# Patient Record
Sex: Female | Born: 1960 | Race: White | Hispanic: No | Marital: Married | Smoking: Former smoker
Health system: Southern US, Community
[De-identification: ages and names within clinical notes are randomized; demographics above are authoritative.]

## PROBLEM LIST (undated history)

## (undated) DIAGNOSIS — F4310 Post-traumatic stress disorder, unspecified: Secondary | ICD-10-CM

## (undated) DIAGNOSIS — N289 Disorder of kidney and ureter, unspecified: Secondary | ICD-10-CM

## (undated) DIAGNOSIS — M797 Fibromyalgia: Secondary | ICD-10-CM

## (undated) DIAGNOSIS — G894 Chronic pain syndrome: Secondary | ICD-10-CM

## (undated) DIAGNOSIS — IMO0001 Reserved for inherently not codable concepts without codable children: Secondary | ICD-10-CM

## (undated) DIAGNOSIS — F419 Anxiety disorder, unspecified: Secondary | ICD-10-CM

## (undated) DIAGNOSIS — M722 Plantar fascial fibromatosis: Secondary | ICD-10-CM

## (undated) DIAGNOSIS — F319 Bipolar disorder, unspecified: Secondary | ICD-10-CM

## (undated) DIAGNOSIS — E079 Disorder of thyroid, unspecified: Secondary | ICD-10-CM

## (undated) DIAGNOSIS — F32A Depression, unspecified: Secondary | ICD-10-CM

## (undated) DIAGNOSIS — R5383 Other fatigue: Secondary | ICD-10-CM

## (undated) DIAGNOSIS — F329 Major depressive disorder, single episode, unspecified: Secondary | ICD-10-CM

## (undated) HISTORY — DX: Major depressive disorder, single episode, unspecified: F32.9

## (undated) HISTORY — DX: Depression, unspecified: F32.A

## (undated) HISTORY — DX: Plantar fascial fibromatosis: M72.2

## (undated) HISTORY — PX: APPENDECTOMY: SHX54

## (undated) HISTORY — DX: Post-traumatic stress disorder, unspecified: F43.10

## (undated) HISTORY — PX: HERNIA REPAIR: SHX51

## (undated) HISTORY — PX: FINGER SURGERY: SHX640

## (undated) HISTORY — DX: Anxiety disorder, unspecified: F41.9

## (undated) HISTORY — PX: TONSILLECTOMY: SUR1361

## (undated) HISTORY — DX: Bipolar disorder, unspecified: F31.9

## (undated) HISTORY — PX: NASAL SINUS SURGERY: SHX719

## (undated) HISTORY — DX: Disorder of kidney and ureter, unspecified: N28.9

## (undated) HISTORY — PX: CHOLECYSTECTOMY: SHX55

## (undated) HISTORY — PX: ECTOPIC PREGNANCY SURGERY: SHX613

---

## 1998-09-03 ENCOUNTER — Ambulatory Visit (HOSPITAL_COMMUNITY): Admission: RE | Admit: 1998-09-03 | Discharge: 1998-09-03 | Payer: Self-pay | Admitting: Orthopaedic Surgery

## 1998-09-03 ENCOUNTER — Encounter: Payer: Self-pay | Admitting: Orthopaedic Surgery

## 1999-02-28 ENCOUNTER — Emergency Department (HOSPITAL_COMMUNITY): Admission: EM | Admit: 1999-02-28 | Discharge: 1999-02-28 | Payer: Self-pay | Admitting: Emergency Medicine

## 1999-02-28 ENCOUNTER — Encounter: Payer: Self-pay | Admitting: Emergency Medicine

## 1999-03-06 ENCOUNTER — Ambulatory Visit (HOSPITAL_COMMUNITY): Admission: RE | Admit: 1999-03-06 | Discharge: 1999-03-06 | Payer: Self-pay | Admitting: Gastroenterology

## 1999-03-06 ENCOUNTER — Encounter: Payer: Self-pay | Admitting: Gastroenterology

## 1999-03-11 ENCOUNTER — Ambulatory Visit (HOSPITAL_COMMUNITY): Admission: RE | Admit: 1999-03-11 | Discharge: 1999-03-11 | Payer: Self-pay | Admitting: Gastroenterology

## 1999-03-25 ENCOUNTER — Encounter: Payer: Self-pay | Admitting: Obstetrics and Gynecology

## 1999-03-25 ENCOUNTER — Ambulatory Visit (HOSPITAL_COMMUNITY): Admission: RE | Admit: 1999-03-25 | Discharge: 1999-03-25 | Payer: Self-pay | Admitting: Obstetrics and Gynecology

## 2000-02-18 ENCOUNTER — Encounter: Payer: Self-pay | Admitting: Gastroenterology

## 2000-02-18 ENCOUNTER — Encounter: Admission: RE | Admit: 2000-02-18 | Discharge: 2000-02-18 | Payer: Self-pay | Admitting: Gastroenterology

## 2000-02-20 ENCOUNTER — Encounter (INDEPENDENT_AMBULATORY_CARE_PROVIDER_SITE_OTHER): Payer: Self-pay | Admitting: Specialist

## 2000-02-20 ENCOUNTER — Ambulatory Visit (HOSPITAL_COMMUNITY): Admission: RE | Admit: 2000-02-20 | Discharge: 2000-02-20 | Payer: Self-pay | Admitting: Gastroenterology

## 2000-02-20 ENCOUNTER — Encounter: Payer: Self-pay | Admitting: Gastroenterology

## 2000-02-20 ENCOUNTER — Inpatient Hospital Stay (HOSPITAL_COMMUNITY): Admission: EM | Admit: 2000-02-20 | Discharge: 2000-02-22 | Payer: Self-pay | Admitting: Emergency Medicine

## 2000-02-21 ENCOUNTER — Encounter: Payer: Self-pay | Admitting: General Surgery

## 2000-03-25 ENCOUNTER — Ambulatory Visit (HOSPITAL_COMMUNITY): Admission: RE | Admit: 2000-03-25 | Discharge: 2000-03-25 | Payer: Self-pay | Admitting: Obstetrics and Gynecology

## 2000-03-25 ENCOUNTER — Encounter (INDEPENDENT_AMBULATORY_CARE_PROVIDER_SITE_OTHER): Payer: Self-pay | Admitting: Specialist

## 2000-03-25 ENCOUNTER — Inpatient Hospital Stay (HOSPITAL_COMMUNITY): Admission: AD | Admit: 2000-03-25 | Discharge: 2000-03-25 | Payer: Self-pay | Admitting: Obstetrics and Gynecology

## 2000-07-02 ENCOUNTER — Encounter: Payer: Self-pay | Admitting: Gastroenterology

## 2000-07-02 ENCOUNTER — Ambulatory Visit (HOSPITAL_COMMUNITY): Admission: RE | Admit: 2000-07-02 | Discharge: 2000-07-02 | Payer: Self-pay | Admitting: Gastroenterology

## 2000-07-20 ENCOUNTER — Ambulatory Visit (HOSPITAL_BASED_OUTPATIENT_CLINIC_OR_DEPARTMENT_OTHER): Admission: RE | Admit: 2000-07-20 | Discharge: 2000-07-20 | Payer: Self-pay | Admitting: General Surgery

## 2000-10-06 DIAGNOSIS — IMO0001 Reserved for inherently not codable concepts without codable children: Secondary | ICD-10-CM

## 2000-10-06 HISTORY — DX: Reserved for inherently not codable concepts without codable children: IMO0001

## 2001-09-13 ENCOUNTER — Encounter: Payer: Self-pay | Admitting: Emergency Medicine

## 2001-09-13 ENCOUNTER — Inpatient Hospital Stay (HOSPITAL_COMMUNITY): Admission: EM | Admit: 2001-09-13 | Discharge: 2001-09-16 | Payer: Self-pay | Admitting: Emergency Medicine

## 2001-09-14 ENCOUNTER — Encounter: Payer: Self-pay | Admitting: Cardiology

## 2001-09-16 ENCOUNTER — Encounter: Payer: Self-pay | Admitting: Cardiology

## 2001-09-22 ENCOUNTER — Encounter: Admission: RE | Admit: 2001-09-22 | Discharge: 2001-09-22 | Payer: Self-pay | Admitting: Cardiology

## 2001-09-22 ENCOUNTER — Encounter: Payer: Self-pay | Admitting: Cardiology

## 2001-11-30 ENCOUNTER — Encounter: Payer: Self-pay | Admitting: Obstetrics and Gynecology

## 2001-11-30 ENCOUNTER — Ambulatory Visit (HOSPITAL_COMMUNITY): Admission: RE | Admit: 2001-11-30 | Discharge: 2001-11-30 | Payer: Self-pay | Admitting: Obstetrics and Gynecology

## 2002-02-25 ENCOUNTER — Encounter: Payer: Self-pay | Admitting: *Deleted

## 2002-02-25 ENCOUNTER — Emergency Department (HOSPITAL_COMMUNITY): Admission: EM | Admit: 2002-02-25 | Discharge: 2002-02-25 | Payer: Self-pay | Admitting: *Deleted

## 2002-03-03 ENCOUNTER — Encounter: Payer: Self-pay | Admitting: General Surgery

## 2002-03-03 ENCOUNTER — Observation Stay (HOSPITAL_COMMUNITY): Admission: RE | Admit: 2002-03-03 | Discharge: 2002-03-04 | Payer: Self-pay | Admitting: General Surgery

## 2002-11-23 ENCOUNTER — Encounter: Payer: Self-pay | Admitting: Otolaryngology

## 2002-11-23 ENCOUNTER — Encounter: Admission: RE | Admit: 2002-11-23 | Discharge: 2002-11-23 | Payer: Self-pay | Admitting: Otolaryngology

## 2003-10-17 ENCOUNTER — Emergency Department (HOSPITAL_COMMUNITY): Admission: EM | Admit: 2003-10-17 | Discharge: 2003-10-17 | Payer: Self-pay | Admitting: *Deleted

## 2003-10-31 ENCOUNTER — Other Ambulatory Visit: Admission: RE | Admit: 2003-10-31 | Discharge: 2003-10-31 | Payer: Self-pay | Admitting: Obstetrics and Gynecology

## 2003-11-08 ENCOUNTER — Encounter: Admission: RE | Admit: 2003-11-08 | Discharge: 2003-11-08 | Payer: Self-pay | Admitting: Internal Medicine

## 2004-01-26 ENCOUNTER — Ambulatory Visit (HOSPITAL_COMMUNITY): Admission: RE | Admit: 2004-01-26 | Discharge: 2004-01-26 | Payer: Self-pay | Admitting: Internal Medicine

## 2004-05-29 ENCOUNTER — Ambulatory Visit (HOSPITAL_COMMUNITY): Admission: RE | Admit: 2004-05-29 | Discharge: 2004-05-29 | Payer: Self-pay | Admitting: Internal Medicine

## 2004-09-18 ENCOUNTER — Emergency Department (HOSPITAL_COMMUNITY): Admission: EM | Admit: 2004-09-18 | Discharge: 2004-09-18 | Payer: Self-pay | Admitting: Emergency Medicine

## 2005-01-16 ENCOUNTER — Inpatient Hospital Stay (HOSPITAL_COMMUNITY): Admission: RE | Admit: 2005-01-16 | Discharge: 2005-01-21 | Payer: Self-pay | Admitting: Psychiatry

## 2005-01-16 ENCOUNTER — Ambulatory Visit: Payer: Self-pay | Admitting: Psychiatry

## 2005-01-17 ENCOUNTER — Ambulatory Visit (HOSPITAL_COMMUNITY): Admission: RE | Admit: 2005-01-17 | Discharge: 2005-01-17 | Payer: Self-pay | Admitting: Psychiatry

## 2006-01-25 ENCOUNTER — Emergency Department (HOSPITAL_COMMUNITY): Admission: EM | Admit: 2006-01-25 | Discharge: 2006-01-26 | Payer: Self-pay | Admitting: Emergency Medicine

## 2006-04-23 ENCOUNTER — Emergency Department (HOSPITAL_COMMUNITY): Admission: EM | Admit: 2006-04-23 | Discharge: 2006-04-23 | Payer: Self-pay | Admitting: Family Medicine

## 2007-06-20 ENCOUNTER — Emergency Department (HOSPITAL_COMMUNITY): Admission: EM | Admit: 2007-06-20 | Discharge: 2007-06-21 | Payer: Self-pay | Admitting: Emergency Medicine

## 2007-06-29 ENCOUNTER — Encounter: Admission: RE | Admit: 2007-06-29 | Discharge: 2007-06-29 | Payer: Self-pay | Admitting: Gastroenterology

## 2007-11-26 ENCOUNTER — Emergency Department (HOSPITAL_COMMUNITY): Admission: EM | Admit: 2007-11-26 | Discharge: 2007-11-26 | Payer: Self-pay | Admitting: Emergency Medicine

## 2008-01-02 ENCOUNTER — Emergency Department (HOSPITAL_COMMUNITY): Admission: EM | Admit: 2008-01-02 | Discharge: 2008-01-02 | Payer: Self-pay | Admitting: Emergency Medicine

## 2008-01-03 ENCOUNTER — Inpatient Hospital Stay (HOSPITAL_COMMUNITY): Admission: AD | Admit: 2008-01-03 | Discharge: 2008-01-05 | Payer: Self-pay | Admitting: *Deleted

## 2008-01-03 ENCOUNTER — Ambulatory Visit: Payer: Self-pay | Admitting: Psychiatry

## 2008-03-14 ENCOUNTER — Emergency Department (HOSPITAL_COMMUNITY): Admission: EM | Admit: 2008-03-14 | Discharge: 2008-03-14 | Payer: Self-pay | Admitting: Emergency Medicine

## 2008-03-21 ENCOUNTER — Encounter: Admission: RE | Admit: 2008-03-21 | Discharge: 2008-03-21 | Payer: Self-pay | Admitting: Emergency Medicine

## 2008-07-07 ENCOUNTER — Emergency Department (HOSPITAL_COMMUNITY): Admission: EM | Admit: 2008-07-07 | Discharge: 2008-07-07 | Payer: Self-pay | Admitting: Family Medicine

## 2010-11-13 ENCOUNTER — Inpatient Hospital Stay (INDEPENDENT_AMBULATORY_CARE_PROVIDER_SITE_OTHER)
Admission: RE | Admit: 2010-11-13 | Discharge: 2010-11-13 | Disposition: A | Discharge status: Home / Self Care | Payer: Self-pay | Source: Ambulatory Visit | Attending: Family Medicine | Admitting: Family Medicine

## 2010-11-13 DIAGNOSIS — J40 Bronchitis, not specified as acute or chronic: Secondary | ICD-10-CM
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
## 2011-02-21 NOTE — Discharge Summary (Signed)
[place]. [facility]  Patient:    Daisy [name], STEMMER Visit Number: [id number] MRN: [id number]          Service Type: MED Location: [phone] Attending Physician:  Learta Codding Dictated by:   Guy Franco, P.A. [facility] Admit Date:  09/13/2001 Discharge Date: 09/16/2001   CC:         [facility], [address] [place], [place]   Referring Physician Discharge [place]  DISCHARGE DIAGNOSES: 1. Chest pain, resolved. 2. Tobacco abuse. 3. Hypothyroidism, treated.  HISTORY OF PRESENT ILLNESS:  The patient is a 50 year old white female with no known history of coronary artery disease, who began having substernal chest pain on the evening prior to her admission.  The pain radiated to her left arm and was described as a "severe ache."  HOSPITAL COURSE:  The patient was admitted to [facility] and had the following procedures performed:  EKG revealed normal sinus rhythm, rate 81, and no ST-T wave changes.  D-dimer 0.25.  White blood cell count 9.0, hemoglobin 13.2, hematocrit 38.5, platelets 270.  Sodium 139, potassium 3.7, BUN 11, creatinine 0.8.  Cardiac isoenzymes and troponins were performed serially and these were negative.  A lipid profile revealed a total cholesterol of 154, triglycerides 95, HDL 41, and LDL 94.  TSH 2.150.  A beta hCG was negative.  C-reactive protein 1.1.  The urinalysis revealed bacteria.  In addition to her cardiac work-up, she did undergo a stress Cardiolite which revealed no inducible ischemia or infarction and EF 76%.  Because she continued to have substernal chest pain, she then underwent a cardiac catheterization which revealed normal systolic function and EF 65% with normal coronary arteries.  The patient did, however, have a myocardial bridge of the LAD.  In addition, she underwent a CT of the chest to evaluate for possible PE, however, this was negative.  DISPOSITION:  She was stable and felt to be ready to go  home on September 16, 2001, on the following medications.  DISCHARGE MEDICATIONS: 1. Synthroid 100 mcg one p.o. q.d. 2. Enteric-coated aspirin 325 mg a day. 3. Cardizem CD 120 mg a day.  DIET:  She is to remain on a low-fat diet.  WOUND CARE:  Clean over her catheterization site with soap and water.  FOLLOW-UP:  Follow up with her [facility] physician as needed.  She has a follow-up appointment with Lewayne Bunting, M.D., on September 30, 2001, at 10:15 a.m. Dictated by:   Guy Franco, P.A. [facility] Attending Physician:  Learta Codding DD:  09/16/01 TD:  09/16/01 Job: [id number]

## 2011-02-21 NOTE — H&P (Signed)
[name], [name]               ACCOUNT NO.:  [id number]   MEDICAL RECORD NO.:  [id number]          PATIENT TYPE:  IPS   LOCATION:  0307                          FACILITY:  [facility]   PHYSICIAN:  Daisy [name], M.D.      DATE OF BIRTH:  11/28/1960   DATE OF ADMISSION:  01/16/2005  DATE OF DISCHARGE:                         PSYCHIATRIC ADMISSION ASSESSMENT   IDENTIFYING INFORMATION:  A 50 year old separated female, voluntarily  admitted on January 16, 2005.   HISTORY OF PRESENT ILLNESS:  The patient presents with a history of  depression, positive auditory hallucinations, also visual hallucinations,  seeing shadows.  The patient has been experiencing hallucinations for the  last 2 days.  The patient reports she hears her daughter and has seen  shadows, also experiencing suicide ideation, wanting to overdose.  The  patient also reports physical problems, having recent muscle aches.  She  reports decreased sleep, decreased appetite with a 17 pound weight loss,  also has had some recent medication changes.  She reports no significant  stressors.   PAST PSYCHIATRIC HISTORY:  First admission to [facility], has  a history of bipolar disorder, has a psychologist Arts administrator at [facility], also has a Publishing rights manager that she sees at [facility], first name  Daisy [name].   SOCIAL HISTORY:  She is a 50 year old separated white female, second  married, has two children ages 26 and 67 years of age.  Lives with her 97-  year-old.  The patient is currently unemployed, states she is on medical  leave, was an Physiological scientist.  She reports no legal problems.   FAMILY HISTORY:  Denies.   ALCOHOL DRUG HISTORY:  The patient smokes, denies any alcohol or drug use.   PAST MEDICAL HISTORY:  Primary care Daisy [name] is Dr. Wylene [name] at [facility], phone number [phone].  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   MEDICATIONS:  Has been on Dostinex that  is to help with shrinkage of the  tumor and has been taking Synthroid 88 mcg, reports she has been compliant.  Also has been on Cymbalta 90 mg, Geodon 60 mg b.i.d. since Friday prior to  this admission, Depakote 1000 mg, stopped her medication because she reports  double vision, and Klonopin 1 mg t.i.d..   DRUG ALLERGIES:  No known allergies.   PHYSICAL EXAMINATION:  Done.  Review of systems is significant for decreased  sleep.  Reports no chest pain or shortness of breath.  The patient smokes.  She reports a decreased appetite with a 17 pound weight loss.  Medical  history:  History of pituitary tumor and hypothyroidism.  Temperature is  99.4, 106 heart rate, 20 respirations.  Blood pressure is 125/79.  The  patient is approximately 5 feet 6 inches tall, overweight female in no acute  distress.  Negative lymphadenopathy.  CHEST:  Clear.  BREAST EXAM:  Deferred.  HEART:  Regular rate and rhythm.  ABDOMEN:  Soft, nontender abdomen, mildly obese.  EXTREMITIES:  Moves all extremities, 5+ against resistance.  SKIN:  Warm dry, no rashes or lacerations are  noted.  NEUROLOGICAL FINDINGS:  Intact, nonfocal, able to perform heel-to-shin and  normal alternating movements easily.   LABORATORY DATA:  Her CBC is within normal limits.  Her potassium is 3.3,  glucose is 157.  TSH is elevated at 22.145.  Pregnancy test is negative.   MENTAL STATUS EXAM:  Alert, cooperative female, good eye contact, casually  dressed.  Speech is clear, normal rate and tone.  The patient feels sad.  The patient is teary-eyed at times.  Thought processes:  The patient  endorsing positive auditory and visual hallucinations, does not appear to be  actively responding.  Answers are goal directed.  Cognitive function intact.  Memory is good.  Judgment and insight are fair.   ADMISSION DIAGNOSES:   AXIS I:  Bipolar disorder with psychotic features.   AXIS II:  Deferred.   AXIS III:  Hypothyroidism, pituitary  tumor.   AXIS IV:  Problems with primary support group, medical problems, economic  problems.   AXIS V:  Current is 35, this past year 71.   PLAN:  Stabilize mood and thinking.  We will get a CT scan as the patient is  complaining of headache and onset of hallucinations.  We will hold her  Depakote now as the patient reports complaints of blurred vision.  We will  follow up with primary care physician with the patient's elevated thyroid  level.  The patient is to follow up with her appointments at [facility].   TENTATIVE LENGTH OF CARE:  4-5 days.      JO/MEDQ  D:  01/20/2005  T:  01/20/2005  Job:  [id number]

## 2011-02-21 NOTE — H&P (Signed)
[place]. [facility]  Patient:    DENEISHA, DADE                     MRN: [id number] Adm. Date:  02/20/00 Attending:  Anselmo Rod, M.D. CC:         Duncan Dull, M.D.             Adolph Pollack, M.D.                         History and Physical  DATE OF BIRTH:  [birth year].  CHIEF COMPLAINT:  Acute right upper quadrant abdominal pain with nausea and subscapular back pain.  HISTORY OF PRESENT ILLNESS:  Ms. Mitchell Epling is a 50 year old white female who has been seeing me for over a year for right upper quadrant pain.  Patient had an ultrasound and HIDA scan done about a year ago that was read as normal. The patient presented to my office earlier this week with severe right upper quadrant pain, nausea and vomiting, with radiation of the pain to her back. She says she has been uncomfortable for several days and unable to eat or sleep at night.  She denies GI bleeding in the form of BRBPR or melena.  There is no history of fever, chills or rigors.  There is no previous history of ulcers, jaundice or colitis.  Patient had an ultrasound yesterday that was somewhat abnormal, in the fact that there was shadowing seen around the neck of the gallbladder, but no definite stones were identified.  The shadowing persisted in multiple positions and a HIDA scan was done today that showed an ejection fraction of 13.9%.  Considering the patients acute abdominal pain with no relief on the pain medication she is on, patient was asked to come to the emergency room for possible admission.  ALLERGIES:  TYLOX.  MEDICATIONS:  Paxil 20 mg per day.  PAST MEDICAL HISTORY:  History of depression, on Paxil for several years.  PAST SURGICAL HISTORY: 1. Appendectomy in 1979. 2. C-sections in 1984 and 1989.  SOCIAL HISTORY:  The patient is married.  She has two children, a 51 year old son and 60 year old daughter.  She smokes a half pack per day, for the last  23 years.  She denies use of alcohol or street drugs.  She works at a Audiological [employer].  FAMILY HISTORY:  Her father is 84 and has hypertension and heart disease. Mother is 23 and has hypertension and heart disease and diabetes.  The patient has four brothers who are healthy.  There is a history of colon cancer in a maternal uncle and breast cancer in a maternal aunt but the age of diagnosis is not known.  There is no family history of prostate, ovarian or uterine cancer.  REVIEW OF SYSTEMS: 1. Headaches. 2. Nausea and vomiting. 3. RUQ pain with subscapular right sided back pain. 4. Patient says she has had occasional BRBPR secondary to hard stool but not    in the recent past.  PHYSICAL EXAMINATION:  GENERAL:  Examination reveals a middle-aged white female in distress secondary to pain.  VITAL SIGNS:  Temperature is 99.2, blood pressure 120/80, pulse 87 per minute, respiratory rate 18.  HEENT:  Atraumatic and normocephalic head.  PERRLA.  Extraocular muscles intact.  Facial symmetry preserved.  NECK:  Supple.  CHEST:  Clear to auscultation.  S1 and S2 regular.  No murmur, rub or gallop.  No rales, rhonchi or wheezing.  ABDOMEN:  Soft with significant right upper quadrant tenderness and positive Murphys sign.  No hepatosplenomegaly.  No masses palpable.  There are C-section and appendectomy scars present.  EXTREMITIES:  Without edema, cyanosis or clubbing.  NEUROLOGIC:  Patient is neurologically intact.  LABORATORY DATA:  Laboratory evaluation revealed a white count of 16,900, hemoglobin 13.8, hematocrit 40%, platelets 283,000, MCV 85.3, neutrophils 74%, ANC 12.6%.  Sodium 135, potassium 3.3, chloride 104, CO2 26, BUN 12, creatinine 0.7, glucose 128, calcium 8.4, total protein 6.4, albumin 3.2, AST normal, ALT 74, alkaline phosphatase 56, total bilirubin 0.3, amylase 51, lipase 21.  ASSESSMENT AND PLAN:  Acute cholecystits with gallbladder ejection fraction of 13.9%.   Plan is to keep the patient on Demerol and Phenergan for symptomatic relief, start intravenous fluids and procure a surgical consultation.  I have discussed the situation with Dr. Adolph Pollack, who will see her at his earliest convenience.  Patient will be started on antibiotics, considering her white count of 16,900 in the emergency room today.  Further recommendations will be made as we continue to follow her with the surgical service. DD:  02/20/00 TD:  02/21/00 Job: [id number]

## 2011-02-21 NOTE — Op Note (Signed)
[facility]  Patient:    Daisy [name], Daisy [name]                      MRN: [id number] Proc. Date: 03/25/00 Adm. Date:  [id number] Disc. Date: [id number] Attending:  Lendon Colonel                           Operative Report  PREOPERATIVE DIAGNOSIS:  Ectopic.  POSTOPERATIVE DIAGNOSIS:  Ectopic.  OPERATION PERFORMED:  Operative laparoscopy right partial salpingectomy with removal of ectopic pregnancy.  DESCRIPTION OF PROCEDURE:  The patient was placed in lithotomy position, prepped and draped in the usual fashion A Hulka elevator was inserted into an anterior pointing uterus. Visualization of the pelvis was accomplished by making a transverse umbilical incision. The abdomen was distended with carbon dioxide and with a Veress needle, aspiration infusion technique was utilized. The bladder had been previously emptied. A trocar was inserted into the abdomen and visualization of the pelvis revealed blood in the abdominal cavity. The distal end of the right tube was involved.  I was able to obtain the end of the tube and elevated it and using the Seitzinger tripolar forceps separated the tube from the ovary and removed the ectopic on the right. There was some oozing from around the ovary and I was able to arrest this using the bipolar cautery. Hemostasis was secured and the pelvis was washed with copious amounts of saline. The right tube and ovary were normal. Sherry tolerated the procedure well. Gas was evacuated, the skin was closed with #0 Vicryl using a UR-6 needle for the umbilicus and 3-0 Vicryl for the skin incisions. Three incisions were used, one in the umbilicus, one in the midline and one in the right mid quadrant. The patient tolerated the procedure well and was sent to the recovery room in good condition. The incisions were infiltrated with 0.5% Marcaine with epinephrine. DD:  03/25/00 TD:  03/27/00 Job: [id number]

## 2011-02-21 NOTE — H&P (Signed)
[place]. [facility]  Patient:    Daisy [name], Daisy [name] Visit Number: [id number] MRN: [id number]          Service Type: MED Location: [id number] Attending Physician:  Learta Codding Dictated by:   Lewayne Bunting, M.D. [facility] Admit Date:  09/13/2001   CC:         [facility]  Lewayne Bunting, M.D. [facility]   History and Physical  REFERRING PHYSICIAN:  [facility].  CARDIOLOGIST:  New.  CHIEF COMPLAINT:  Substernal chest pain.  HISTORY OF PRESENT ILLNESS:  Ms. Macnaughton is a 50 year old white female with no prior history of coronary artery disease.  The patient reports onset of substernal chest pain starting last evening with pain radiating into the left arm associated with severe ache.  She also reports heavy retrosternal pain which lasted approximately one hour.  She felt fine later on but then developed again substernal chest pain with arm ache this morning.  This was also associated with shortness of breath and diaphoresis.  The patient has good exercise tolerance up until yesterday with no substernal chest pain or shortness of breath on exertion.  Today, at [facility], she was given three nitroglycerin with some relief.  Symptoms were associated with shortness of breath and diaphoresis.  ALLERGIES:  No known drug allergies.  MEDICATIONS:  Synthroid 100 mcg p.o. q.d.  PAST MEDICAL HISTORY:  Family history of coronary artery disease.  Tobacco use.  History of tubal ligation and cholecystectomy.  Tubal pregnancy.  Sinus surgery.  History of C-section x 2.  History of hypothyroidism.  History of toxemia pregnancy.  SOCIAL HISTORY:  [place]  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  She smokes tobacco with a 30-pack-year history. She does not drink alcohol.  She denies drug use.  FAMILY HISTORY:  Mother is alive but had a CVA in her 31s.  Father had his myocardial infarction in his 64s.  She has no siblings with coronary  artery disease.  REVIEW OF SYSTEMS:  Positive for cold sweats and weight changes associated with hypothyroidism.  No headache or sore throat.  No rash or skin lesions. CARDIOPULMONARY:  Positive for chest pain and shortness of breath.  No dyspnea on exertion.  No orthopnea, PND.  Edema in both hands.  No frequency or dysuria.  Last menstrual period was three to four months ago. NEUROPSYCHIATRIC:  Complaining of numbness in the left arm.  MUSCULOSKELETAL: No myalgias or arthalgias.  GI:  Positive for nausea, abdominal pain right-sided.  No diarrhea, bright red blood per rectum, melena, or hematochezia.  No polyuria or polydipsia.  All other review of systems are negative.  PHYSICAL EXAMINATION:  VITAL SIGNS:  Blood pressure 127/74, heart rate is 85 beats per minute, respirations are 18 per minute, temperature is 97.2.  Saturation 98% on two liters.  GENERAL:  White female in no apparent distress.  HEENT:  NCAT.  PERRLA.  EOMI.  Sclerae clear.  NECK:  Supple.  No bruit.  No JVD.  No lymphadenopathy.  HEART:  Regular rate and rhythm.  Normal S1 and S2.  No murmurs, rubs, or gallops.  Pulses are equal bilaterally.  LUNGS:  Clear breath sounds bilaterally.  SKIN:  No rashes or lesions.  ABDOMEN:  Soft and nontender.  No rebound or guarding.  GENITOURINARY:  Deferred.  RECTAL:  Deferred.  EXTREMITIES:  No clubbing, cyanosis, or edema.  MUSCULOSKELETAL:  No joint deformity.  NEUROLOGICAL:  The patient is alert, oriented, and grossly nonfocal.  Cranial nerves II-XII are grossly intact.  LABORATORY DATA:  Chest x-ray shows no acute changes.  EKG normal sinus rhythm, heart rate is 81 beats per minute and normal axis.  No ST or T-wave changes, otherwise normal electrocardiogram.  Labs are currently pending including CBC and BMET.  IMPRESSION: 1. Substernal chest pain:  The patient has multiple risk factors for coronary    artery disease with a very strong family history of  premature coronary    artery disease.  Although, she has some typical features to her chest pain    syndrome, some other features are rather atypical.  She also premenopausal.    The patient will be admitted and started on aspirin, heparin, and    multi-marker assessment will be performed including a high sensitive    CRP, troponin level, and BNP level.  If the patient does have positive    markers, I would be leaning towards proceeding with a cardiac    catheterization.  I have discussed this with the patient and she has    agreed to a cardiac catheterization in the a.m. and she understands the    risks and benefits.  If her myocardial injury markers are within normal    limits, I would lean more towards an exercise Cardiolite study which    can be done in the morning. 2. Last menstrual period three to four months ago:  Check beta hCG tonight.Dictated by:   Lewayne Bunting, M.D. LHC Attending Physician:  Learta Codding DD:  09/13/01 TD:  09/14/01 Job: [id number]
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
## 2011-02-21 NOTE — Op Note (Signed)
[facility]  Patient:    Daisy [name], Daisy [name] Visit Number: [id number] MRN: [id number]          Service Type: SUR Location: 3W 0346 01 Attending Physician:  Arlis Porta Dictated by:   Adolph Pollack, M.D. Proc. Date: 03/03/02 Admit Date:  03/03/2002 Discharge Date: 03/04/2002                             Operative Report  PREOPERATIVE DIAGNOSIS:  Recurrent ventral incisional hernia.  POSTOPERATIVE DIAGNOSIS:  Recurrent ventral incisional hernia.  PROCEDURE:  Laparoscopic repair of recurrent ventral incisional hernia with Gore-Tex dual mesh.  SURGEON:  Adolph Pollack, M.D.  ANESTHESIA:  General.  INDICATIONS FOR PROCEDURE:  Ms. Shidler is a 50 year old female who developed an epigastric incisional hernia following a laparoscopic cholecystectomy which I repaired a little under two years ago with mesh. She began having pains and bulging type symptoms and on examination has recurrence of her hernia. She now presents for elective repair. The procedure and the risks were explained to her preoperatively.  TECHNIQUE:  She is brought to the operating room, placed supine on the operating table and a general anesthetic was administered. The abdomen was sterilely prepped and draped. A previous small subumbilical scar was reincised transversely, subcutaneous tissue was divided sharply until the fascia was identified and an approximately 1 cm incision made in the fascia. The peritoneal cavity was then entered sharply under direct vision and a pursestring suture of #0 Vicryl was placed around the fascial edges. A Hasson trocar was introduced into the peritoneal cavity and a pneumoperitoneum was created by insufflation of CO2 gas. Next a 30 degree laparoscope was introduced.  I noted a visible fascial defect in the epigastric region and it appeared to have some preperitoneal fat in it. No intestinal contents were noted in the defect.  Under  direct vision, a 5 mm trocar was placed in the left mid abdomen and one in the right mid abdomen. I then began by removing the extraperitoneal fatty contents from the hernia defect. In order to have adequate coverage of the hernia defect, I went ahead and incised the falciform ligament all the way up to the level of the diaphragm. I continued to remove more preperitoneal fat and placed it in the area close to the liver where I could eventually retrieve it. I also noted that while doing the hernia repair, I saw one other small fascial defect superior to the epigastric defect. Once I had all the hernia contents reduced, I placed some of the loose fatty tissue into an endopouch bag and removed it from the field and replaced the Hasson trocar.  I then took spinal needles and placed them at the periphery of the defects and measured approximately 3-4 cm further out from these and drew an oval. A piece of Gore-Tex dual mesh was put onto the field and cut in order to adequately cover the defect with approximately 4 cm of overlay. In the four quadrants of the mesh, anchoring sutures of #0 Novofil were placed. Four stab incisions were made within the four quadrants. The mesh was then placed into the abdominal cavity. Using an endoclose device, the sutures were brought up in each of the four quadrants across the fascial bridge and tied down anchoring the mesh to the anterior abdominal wall and more than adequately covering a defect. A spiral tack was then used to place tacks in a circular  fashion in the periphery. An inner and outer circle were placed. After adequate fixation, the mesh had been assured. I inspected the area for bleeding and noted none. I then released the pneumoperitoneum and watched the mesh approximate the omentum and liver. I then removed all trocars and released the pneumoperitoneum.  The subumbilical fascial defect was closed by tightening up and tying down the pursestring  suture. All skin incisions were closed with 4-0 monocryl subcuticular stitches. Steri-Strips and sterile dressings were applied.  The patient tolerated the procedure well without any apparent complications and was taken to the recovery room in satisfactory condition. Dictated by:   Adolph Pollack, M.D. Attending Physician:  Arlis Porta DD:  03/21/02 TD:  03/23/02 Job: [id number]

## 2011-02-21 NOTE — Op Note (Signed)
[place]  Patient:    Daisy [name], Daisy [name]                      MRN: [id number] Proc. Date: 02/21/00 Adm. Date:  [id number] Disc. Date: [id number] Attending:  Arlis Porta CC:         Duncan Dull, M.D.             Anselmo Rod, M.D.                           Operative Report  PREOPERATIVE DIAGNOSIS:  Acute cholecystitis.  POSTOPERATIVE DIAGNOSIS:  Chronic cholecystitis.  PROCEDURE:  Laparoscopic cholecystectomy.  SURGEON:  Adolph Pollack, M.D.  ASSISTANT:  Abigail Miyamoto, M.D.  ANESTHESIA:  General.  INDICATIONS:  This 50 year old female had the onset of right upper quadrant pain radiating around to her subscapular region with some nausea and low-grade fever on Monday.  She was seen in the office by Anselmo Rod, M.D., and an ultrasound was performed.  It demonstrated some abnormality in the gallbladder neck with some shadowing and a question of stones.  HIDA scan demonstrated a decreased ejection fraction, but filling of the cystic duct.  She presented to the emergency department yesterday with increasing pain and a white count of 16,000 and focal peritoneal signs in the right upper quadrant.  She was given IV antibiotics overnight, hydrated, and now brought to the operating room.  DESCRIPTION OF PROCEDURE:  She was placed supine on the operating room and a general anesthetic was administered.  The abdomen was sterilely prepped and draped.  Local anesthetic of 0.5% plain Marcaine was infiltrated in the subumbilical region and a small subumbilical incision was made and carried through the subcutaneous tissues bluntly.  A 1 cm incision was made at the fascia.  The peritoneal cavity was entered sharply under direct vision. Pursestring sutures were placed around the edges of the fascia.  A Hasson trocar was introduced into the peritoneal cavity and the pneumoperitoneum created by insufflation of CO2 gas.  Next, local  anesthetic was infiltrated in the epigastrium in the right mid abdomen.  An 11 mm trocar was placed through an epigastric incision and two 5 mm trocars were placed through two right midabdominal incisions.  Upon exploration with the laparoscope, I noted that she had an enlarged and fatty liver.  I did not see any purulent fluid.  The gallbladder did not appear acutely inflamed, although it appeared to have some adhesions between the duodenum and the gallbladder.  The fundus of the gallbladder was grasped and retracted toward the right shoulder.  Then the adhesions were taken down.  The infundibulum was grasped and retracted laterally.  Using careful blunt dissection, I identified both the cystic duct and cystic artery.  The cystic duct was clipped three times proximally, once distally, and divided.  The cystic artery was clipped twice proximally, once distally, and divided.  The gallbladder was then dissected free from the liver bed using electrocautery.  There was a small puncture placed in the gallbladder with some leakage of bile.  Once the gallbladder was removed from the liver bed, it was placed in an endocatch bag.  I irrigated the gallbladder fossa and bleeding points were controlled with the cautery.  I went ahead and irrigated the perihepatic area well until the field was clear. I once again inspected the gallbladder fossa and did not note any bleeding  or bile leakage.  I then removed the gallbladder through the subumbilical port and closed that fascial defect under direct vision by tightening up and tying down the pursestring suture.  Next, the perihepatic fluid was then again evacuated, as well as some pelvic fluid.  All trocars were removed and the pneumoperitoneum released.  The skin incision was closed with 4-0 Monocryl subcuticular stitches followed by Steri-Strips and sterile dressings.  She tolerated the procedure well without any apparent complications and she was  taken to the recovery room in satisfactory condition. DD:  02/21/00 TD:  02/26/00 Job: [id number]

## 2011-02-21 NOTE — Consult Note (Signed)
[place]  Patient:    Daisy [name], Daisy [name]                      MRN: [id number] Proc. Date: 02/21/00 Adm. Date:  [id number] Attending:  Arlis Porta CC:         Anselmo Rod, M.D.                          Consultation Report  REFERRING PHYSICIAN:  Anselmo Rod, M.D.  CONSULTING PHYSICIAN:  Adolph Pollack, M.D.  REASON FOR CONSULTATION:  Right upper quadrant abdominal pain.  HISTORY OF PRESENT ILLNESS:  Ms. Harnish is a 50 year old female who has a four day history of progressively increasing right upper quadrant pain that radiates around this right subcapsular region.  She has associated symptoms of some nausea, vomiting and a low grade fever.  There does not seem to be any relationship to meals.  Apparently, she had this approximately a year ago and had an ultrasound and a HIDA scan which are normal. She had an ultrasound on Feb 18, 2000 which demonstrated some shadowing in the gallbladder neck region without definite gallstones.  A HIDA scan showed an ejection fraction of 13.9%.  The pain was increasing today.  She was seen by Dr. Loreta Ave and noted to have a white blood cell count of 16,900.  She had normal liver function test. She was subsequently admitted with diagnosis of cholecystitis.  I was asked to see her because of this.  PAST MEDICAL HISTORY:  Depression.  PAST SURGICAL HISTORY:  Appendectomy. Cesarean section times two.  ALLERGIES:  Tylox.  MEDICATIONS:  Paxil 20 mg a day.  SOCIAL HISTORY:  She smokes a half a pack of cigarettes a day.  She denies alcohol use.  She is married.  She works at a parts department.  FAMILY HISTORY:  Positive for coronary artery disease, diabetes mellitus.  Her mom recently had a cholecystectomy.  REVIEW OF SYSTEMS:  Cardiovascular: No known heart disease or hypertension. Pulmonary:  Denies emphysema, pneumonia, or TB.  GI: No hepatitis or yellow jaundice.  GU: No dysuria or  hematuria.  Endocrine: No diabetes or thyroid disease.  PHYSICAL EXAMINATION:  Generally a well-developed, well-nourished, female who appears to be in no acute distress but is holding the right upper quadrant. Temperature is normal as are her blood pressure and heart rate.  EYES:  Extraocular movements are intact.  Sclerae are clear.  NECK: Supple without palpable masses.  NODES:  No palpable cervical or supraclavicular adenopathy.  SKIN:  No jaundice.  CARDIOVASCULAR: Heart demonstrates regular rate and rhythm without murmur.  RESPIRATORY:  Breath sounds are equal and clear.  Respirations are nonlabored.  ABDOMEN:  Soft. There is right upper quadrant tenderness to palpation and a positive Murphys sign.  There are no palpable masses or organomegaly present.  Laboratory values have been reviewed.  IMPRESSION:  Right upper quadrant pain with signs and symptoms and elevated white blood cell count consistent with acute cholecystitis.  She does have a shadowy area of the gallbladder neck. It possible could be a stone.  PLAN:  Cholecystectomy. The procedure and the risks have been explained to her. The risks include but are not limited to bleeding, infection, common bile duct injury, bile leak, small intestine injury, and risks of anesthesia.  She seems to understand this and agrees to proceed. DD:  02/21/00 TD:  02/21/00 Job: [id number]  [id number]

## 2011-02-21 NOTE — Op Note (Signed)
. [facility]  Patient:    Daisy [name], Daisy [name]                      MRN: [id number] Proc. Date: 07/20/00 Adm. Date:  [id number] Disc. Date: [id number] Attending:  Lendon Colonel                           Operative Report  PREOPERATIVE DIAGNOSIS:  Ventral incisional hernia.  POSTOPERATIVE DIAGNOSIS:  Ventral incisional hernia.  PROCEDURE:  Repair of ventral incisional hernia with mesh.  SURGEON:  Adolph Pollack, M.D.  ANESTHESIA:  General.  INDICATIONS:  Ms. Dsouza is a 50 year old female, who underwent a laparoscopic cholecystectomy Feb 21, 2000.  In early June, she lifted a heavy box and noticed an acute pain in her epigastrium.  In the interim, she was found to have an ectopic pregnancy and had to be operated on that.  The pain progressively became worse and she was seen and had a CT scan performed, which demonstrated a hernia of the epigastric incision site and now she presents for repair.  TECHNIQUE:  She was placed supine on the operating table and a general anesthetic was administered.  Her abdomen was sterilely prepped and draped. The previous transverse epigastric incision was identified and reincised and the incision was lengthened towards the right.  The subcutaneous tissue divided with the Bovie.  The fascial defect was appreciated and preperitoneal fat was noted to be protruding through it.  The fascial edges were cleaned of fatty attachments.  The preperitoneal fat was reduced back into the abdominal cavity and then the 2 cm defect was closed primarily with interrupted 0 Surgilon sutures.  A piece of polypropylene mesh was then placed over the repair and anchored to the fascia with interrupted 0 Surgilon sutures.  The wound was then irrigated and the subcutaneous tissue closed with a running 3-0 Vicryl.  The skin was closed with 4-0 Monocryl subcuticular stitch followed by Steri-Strips and sterile dressings.  She  tolerated the procedure well without any apparent complications and was taken to the recovery room in satisfactory condition. DD:  07/20/00 TD:  07/20/00 Job: [id number]

## 2011-02-21 NOTE — Discharge Summary (Signed)
[place]. [facility]  Patient:    Daisy [name], Daisy [name]                      MRN: [id number] Adm. Date:  [id number] Disc. Date: [id number] Attending:  Arlis Porta CC:         Duncan Dull, M.D.             Anselmo Rod, M.D.                           Discharge Summary  PRINCIPAL DISCHARGE DIAGNOSIS:  Chronic calculus cholecystitis.  SECONDARY DIAGNOSIS:  Depression.  PROCEDURE:  Laparoscopic cholecystectomy Feb 21, 2000.  REASON FOR ADMISSION:  This is a 50 year old female who was having intermittent right upper quadrant pain for over a year but it had persistently worsened.  She had a normal ultrasound and HIDA scan a year prior to admission.  She presented back to Dr. Loreta Ave complaining of increasing right upper quadrant pain.  She had a repeat HIDA scan, which was consistent with biliary dyskinesia.  The pain was worse and she presented to the ER and was admitted by Dr. Loreta Ave, who has dictated the full history and physical.  HOSPITAL COURSE:  I saw her in consultation and was concerned with her acute cholecystitis.  She subsequently was taken to the operating room, where she was found to have chronic cholecystitis and underwent a laparoscopic cholecystectomy.  She had an unremarkable postoperative course and by her second postoperative day she was able to be discharged.  DISPOSITION:  Discharged to home on Feb 22, 2000.  DISCHARGE INSTRUCTIONS:  She was discharged with an instruction sheet.  DISCHARGE MEDICATIONS:  She was told to take all home medicines.  She was given Vicodin for pain.  CONDITION ON DISCHARGE:  She was discharged in satisfactory condition. DD:  03/09/00 TD:  03/11/00 Job: [id number]

## 2011-02-21 NOTE — Cardiovascular Report (Signed)
[place]. [facility]  Patient:    PENNEY, DOMANSKI Visit Number: [id number] MRN: [id number]          Service Type: MED Location: 3700 3715 01 Attending Physician:  Learta Codding Dictated by:   Arturo [name]. Riley Kill, M.D. [facility] Proc. Date: 09/15/01 Admit Date:  09/13/2001   CC:         Daisy [name], M.D.  Lewayne Bunting, M.D. [facility]  CV Laboratory   Cardiac Catheterization  INDICATIONS:  Charlton Amor is a 50 year old who presents with chest pain.  There were no diagnostic EKG changes, and the enzymes were negative.  However, she had chest pain during exercise tolerance testing even though the Cardiolite was negative.   As a result, Dr. Andee Lineman carefully considered the options and recommended diagnostic cardiac catheterization.  PROCEDURE: 1. Left heart catheterization. 2. Selective coronary arteriography. 3. Selective left ventriculography. 4. Intracoronary nitroglycerin.  CARDIOLOGIST:  Arturo [name]. Riley Kill, M.D. [facility]  DESCRIPTION OF THE PROCEDURE:  The patient was brought to the catheterization lab and prepped and draped in the usual fashion.  Through an anterior puncture, the right femoral artery was easily entered.  A 6-French sheath was placed.  Views of left and right coronary arteries were obtained in multiple angiography projections.  Ventriculography was performed in the RAO projection.  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 The patient tolerated the procedure without complications.  HEMODYNAMIC DATA: 1. The central aortic pressure was 121/77. 2. Left ventricle pressure 129/15. 3. No aortic-to-left-ventricular gradient.  ANGIOGRAPHIC DATA: 1. Ventriculography was performed in the RAO projection.  Overall systolic    function was well preserved.  Ejection fraction was calculated at 65%.    There were no segmental wall motion abnormalities.  No  significant mitral    regurgitation was appreciated. 2. The left main coronary artery was free of critical disease. 3. The left anterior descending artery coursed to the apex.  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  However, there was not complete    obliteration of this vessel distally during systole or diastole, and    flow appeared to be normal.  No high-grade abnormalities were noted. 4. The circumflex was a dominant vessel.  It provided a marginal and several    posterolateral and posterior descending branch.  It was free of critical    disease. 5. The right coronary artery was a nondominant vessel and free of critical    disease.  CONCLUSIONS: 1. Preserved left ventricular function. 2. No high-grade coronary abnormalities.  DISPOSITION:  We will strongly encourage the patient to stop smoking.  Her D-dimer is 0.46, and as a result, we will obtain a spiral CT scan in the morning to rule out pulmonary embolus.  If this workup is negative, then a conservative management strategy will be recommended and, most importantly, discontinuation of smoking. Dictated by:   Arturo [name] Riley Kill, M.D. [facility] Attending Physician:  Learta Codding DD:  09/15/01 TD:  09/15/01 Job: [id number]

## 2011-02-21 NOTE — Discharge Summary (Signed)
NAMESLOAN, GALENTINE               ACCOUNT NO.:  [id number]   MEDICAL RECORD NO.:  [id number]          PATIENT TYPE:  IPS   LOCATION:  0505                          FACILITY:  [facility]   PHYSICIAN:  Geoffery Lyons, M.D.      DATE OF BIRTH:  [birth year]   DATE OF ADMISSION:  01/03/2008  DATE OF DISCHARGE:  01/05/2008                               DISCHARGE SUMMARY   CHIEF COMPLAINT/HISTORY OF PRESENT ILLNESS:  This was the second  admission to [facility] for this 50 year old female  voluntarily admitted.  History of intentional overdose with Ativan,  which she said was her mother's medication.  She took approximately 11  tablets only to sleep.  She went to the emergency room because her  daughter realized that there was something wrong with her.  Endorsed her  stressors are that she lost her father in May after a long illness, and  her 35 year old son had gotten killed in March of this year in a  motorcycle accident.  Currently taking care of her mother, who is also  ill from a stroke.  Feeling very overwhelmed.   PAST PSYCHIATRIC HISTORY:  Last admission April 2006.  This is the  second time at [facility].  In the past has been on Cymbalta and  Geodon.   ALCOHOL AND DRUG HISTORY:  Denies active use of any substances.   MEDICAL HISTORY:  Noncontributory.   MEDICATIONS:  None.   PHYSICAL EXAMINATION:  Failed to show any acute findings.   LABORATORY WORK:  UDS positive for benzodiazepines, cocaine.  Potassium  3.2.  Alcohol level less than 5.   MENTAL STATUS EXAM:  Reveals a fully alert, cooperative female.  Fair  eye contact.  Currently dressed in hospital scrubs.  Speech was clear,  normal in pace, tone and production.  Mood was depressed.  Affect  depressed, tearful, talking about her son who died.  Thought processes  are logical, coherent and relevant.  No active suicidal or homicidal  ideas.  No hallucinations.  No delusions.  Cognition well-preserved.   ADMISSION DIAGNOSES:  AXIS I:  Major depressive disorder.  Cocaine  abuse.  AXIS II:  No diagnosis.  AXIS III:  No diagnosis.  AXIS IV:  Moderate.  AXIS V:  On admission 35, highest GAF in the last year 60.   COURSE IN THE HOSPITAL:  She was admitted.  She was started in  individual and group psychotherapy.  She was able to go to the loss  group, which helped her process the death of the son.  As already  stated, dealing with a lot of losses, the son killed, and the father  dying, living with the mother,  who had had a stroke.  Decreased energy,  decreased motivation, decreased sleep, decreased appetite.  Not working.  Separated from the husband for 2 years.  On March 31 endorsed that she  had benefitted from the grief and loss, actively dealing with the death  of the son and the father, would like to go back on an antidepressant  other than the Cymbalta.  We  tried Celexa.  On April 1, she was in full  contact with reality.  There were no active suicidal or homicidal ideas,  no hallucinations or delusions.  Committed to making her life better.  Endorsed that she understood that she needed to continue to work on the  grief for the son and the father, willing to pursue outpatient  treatment, also willing to pursue the Celexa that we already started.  There were no concerns about her safety.  We went ahead and discharged  to outpatient followup.   DISCHARGE DIAGNOSES:  AXIS I:  Major depressive disorder.  AXIS II:  No diagnosis.  AXIS III:  No diagnosis.  AXIS IV:  Moderate.  AXIS V:  On discharge 50 to 55.   Discharged on Celexa 20 mg per day, Ambien 10 at night for sleep,  Ativan 1 mg daily as needed for anxiety short-term.   Follow up at [facility] and [facility] as well as hospice.      Geoffery Lyons, M.D.  Electronically Signed     IL/MEDQ  D:  02/02/2008  T:  02/02/2008  Job:  [id number]

## 2011-05-07 ENCOUNTER — Ambulatory Visit (INDEPENDENT_AMBULATORY_CARE_PROVIDER_SITE_OTHER): Payer: Self-pay

## 2011-05-07 ENCOUNTER — Inpatient Hospital Stay (INDEPENDENT_AMBULATORY_CARE_PROVIDER_SITE_OTHER)
Admission: RE | Admit: 2011-05-07 | Discharge: 2011-05-07 | Disposition: A | Discharge status: Home / Self Care | Payer: Self-pay | Source: Ambulatory Visit | Attending: Family Medicine | Admitting: Family Medicine

## 2011-05-07 DIAGNOSIS — M549 Dorsalgia, unspecified: Secondary | ICD-10-CM

## 2011-05-07 DIAGNOSIS — R6889 Other general symptoms and signs: Secondary | ICD-10-CM

## 2011-05-07 LAB — POCT URINALYSIS DIP (DEVICE)
Bilirubin Urine: NEGATIVE
Glucose, UA: NEGATIVE mg/dL
Ketones, ur: NEGATIVE mg/dL
Leukocytes, UA: NEGATIVE
Protein, ur: NEGATIVE mg/dL
Specific Gravity, Urine: <=1.005 (ref 1.005–1.030)

## 2011-05-28 ENCOUNTER — Inpatient Hospital Stay (INDEPENDENT_AMBULATORY_CARE_PROVIDER_SITE_OTHER)
Admission: RE | Admit: 2011-05-28 | Discharge: 2011-05-28 | Disposition: A | Discharge status: Home / Self Care | Payer: Self-pay | Source: Ambulatory Visit | Attending: Family Medicine | Admitting: Family Medicine

## 2011-05-28 DIAGNOSIS — J019 Acute sinusitis, unspecified: Secondary | ICD-10-CM

## 2011-05-28 DIAGNOSIS — J40 Bronchitis, not specified as acute or chronic: Secondary | ICD-10-CM

## 2011-05-28 DIAGNOSIS — H00019 Hordeolum externum unspecified eye, unspecified eyelid: Secondary | ICD-10-CM

## 2011-06-30 LAB — CBC
Platelets: 268
RDW: 14.5
WBC: 15.3 — ABNORMAL HIGH

## 2011-06-30 LAB — URINE MICROSCOPIC-ADD ON

## 2011-06-30 LAB — ETHANOL: Alcohol, Ethyl (B): <5

## 2011-06-30 LAB — BASIC METABOLIC PANEL
BUN: 7
Calcium: 9.3
GFR calc non Af Amer: >60
Glucose, Bld: 116 — ABNORMAL HIGH
Potassium: 3.2 — ABNORMAL LOW
Sodium: 143

## 2011-06-30 LAB — SALICYLATE LEVEL
Salicylate Lvl: 4.4
Salicylate Lvl: 7.8

## 2011-06-30 LAB — URINALYSIS, ROUTINE W REFLEX MICROSCOPIC
Glucose, UA: NEGATIVE
Ketones, ur: NEGATIVE
Leukocytes, UA: NEGATIVE
Protein, ur: NEGATIVE
pH: 6

## 2011-06-30 LAB — RAPID URINE DRUG SCREEN, HOSP PERFORMED
Amphetamines: NOT DETECTED
Barbiturates: NOT DETECTED
Benzodiazepines: POSITIVE — AB
Opiates: NOT DETECTED

## 2011-06-30 LAB — DIFFERENTIAL
Basophils Absolute: 0.2 — ABNORMAL HIGH
Lymphocytes Relative: 35
Lymphs Abs: 5.3 — ABNORMAL HIGH
Neutro Abs: 8.8 — ABNORMAL HIGH

## 2011-06-30 LAB — ACETAMINOPHEN LEVEL: Acetaminophen (Tylenol), Serum: <10 — ABNORMAL LOW

## 2011-07-18 LAB — DIFFERENTIAL
Lymphocytes Relative: 33
Lymphs Abs: 3.5 — ABNORMAL HIGH
Monocytes Relative: 6
Neutro Abs: 6
Neutrophils Relative %: 58

## 2011-07-18 LAB — POCT URINALYSIS DIP (DEVICE)
Glucose, UA: NEGATIVE
Nitrite: NEGATIVE
Operator id: 116391
Protein, ur: NEGATIVE
Specific Gravity, Urine: 1.015
Urobilinogen, UA: 0.2

## 2011-07-18 LAB — CBC
MCV: 87.9
Platelets: 226
RBC: 4.45
WBC: 10.4

## 2011-07-18 LAB — HEPATIC FUNCTION PANEL
ALT: 18
AST: 18
Albumin: 3.4 — ABNORMAL LOW
Alkaline Phosphatase: 66
Total Bilirubin: <0.1 — ABNORMAL LOW
Total Protein: 7

## 2011-07-18 LAB — LIPASE, BLOOD: Lipase: 18

## 2012-10-29 ENCOUNTER — Emergency Department (INDEPENDENT_AMBULATORY_CARE_PROVIDER_SITE_OTHER)
Admission: EM | Admit: 2012-10-29 | Discharge: 2012-10-29 | Disposition: A | Discharge status: Home / Self Care | Payer: Self-pay | Source: Home / Self Care

## 2012-10-29 ENCOUNTER — Encounter (HOSPITAL_COMMUNITY): Payer: Self-pay | Admitting: *Deleted

## 2012-10-29 DIAGNOSIS — J329 Chronic sinusitis, unspecified: Secondary | ICD-10-CM

## 2012-10-29 DIAGNOSIS — L739 Follicular disorder, unspecified: Secondary | ICD-10-CM

## 2012-10-29 DIAGNOSIS — M791 Myalgia, unspecified site: Secondary | ICD-10-CM

## 2012-10-29 DIAGNOSIS — L738 Other specified follicular disorders: Secondary | ICD-10-CM

## 2012-10-29 DIAGNOSIS — IMO0001 Reserved for inherently not codable concepts without codable children: Secondary | ICD-10-CM

## 2012-10-29 MED ORDER — METHYLPREDNISOLONE 4 MG PO KIT: PACK | ORAL | Status: DC

## 2012-10-29 MED ORDER — MUPIROCIN CALCIUM 2 % NA OINT: TOPICAL_OINTMENT | NASAL | Status: DC

## 2012-10-29 MED ORDER — CEFDINIR 300 MG PO CAPS: 300.0000 mg | ORAL_CAPSULE | Freq: Two times a day (BID) | ORAL | Status: DC

## 2012-10-29 NOTE — ED Notes (Signed)
C/o being sick since October with nasal congestion is green, stuffy nose, and occ. cough at night.  C/o bil. Earache x 1 week.  Post. chest pain radiates out to R and L side of upper back for 2 days.  No chills or fever.  Cheeks are flushed.  Had the flu shot in Oct.

## 2012-10-29 NOTE — ED Provider Notes (Signed)
History     CSN: [id number]  Arrival date & time 10/29/12  1531   First MD Initiated Contact with Patient 10/29/12 1616      Chief Complaint  Patient presents with  . Facial Pain    (Consider location/radiation/quality/duration/timing/severity/associated sxs/prior treatment) HPI Comments: 52 year old female is complaining of bilateral facial pain since October 2013. She is complaining of pain in the maxillary and frontal sinuses. This is associated with bilateral earaches, sore throat copious amount of PND and cough. She denies fever, chills or other constitutional symptoms. She has been taking over-the-counter decongestants and antihistamines without consistent relief. She also complains of pain in the upper most back. It is located across the trapezii and suprascapular musculature. She sits at a computer desk which she states is too high for her for long periods of time.   History reviewed. No pertinent past medical history.  Past Surgical History  Procedure Date  . Cholecystectomy   . Appendectomy   . Cesarean section     twice '84 and '89  . Nasal sinus surgery     twice -enlarged sinuses and cleared scar tissue  . Hernia repair 2004 and 2005    twice  same site after cholecystectomy  . Finger surgery     Family History  Problem Relation Age of Onset  . Heart disease Father     History  Substance Use Topics  . Smoking status: Current Every Day Smoker -- 1.0 packs/day    Types: Cigarettes  . Smokeless tobacco: Not on file  . Alcohol Use: No    OB History    Grav Para Term Preterm Abortions TAB SAB Ect Mult Living                  Review of Systems  Constitutional: Negative for fever, chills, activity change, appetite change and fatigue.  HENT: Positive for nosebleeds, congestion, sore throat, rhinorrhea, postnasal drip and sinus pressure. Negative for facial swelling, mouth sores, trouble swallowing, neck pain, neck stiffness and dental problem.   Eyes:  Negative.   Respiratory: Negative.   Cardiovascular: Negative.   Gastrointestinal: Negative.   Genitourinary: Negative.   Musculoskeletal: Negative.   Skin: Negative for pallor and rash.  Neurological: Negative.   Psychiatric/Behavioral: Negative.     Allergies  Review of patient's allergies indicates not on file.  Home Medications   Current Outpatient Rx  Name  Route  Sig  Dispense  Refill  . WOMENS MULTI VITAMIN & MINERAL PO   Oral   Take by mouth.         Marland Kitchen ALKA-SELTZER PLUS COLD & COUGH PO   Oral   Take by mouth.         . CEFDINIR 300 MG PO CAPS   Oral   Take 1 capsule (300 mg total) by mouth 2 (two) times daily.   20 capsule   0   . METHYLPREDNISOLONE 4 MG PO KIT      As directed. Take with food.   21 tablet   0   . MUPIROCIN CALCIUM 2 % NA OINT      Apply in each nostril daily   1 g   1     BP 148/75  Pulse 83  Temp 98.2 F (36.8 C) (Oral)  Resp 16  SpO2 98%  Physical Exam  Nursing note and vitals reviewed. Constitutional: She is oriented to person, place, and time. She appears well-developed and well-nourished. No distress.  Neck: Normal range of motion.  Neck supple.  Cardiovascular: Normal rate and regular rhythm.   Pulmonary/Chest: Effort normal and breath sounds normal. No respiratory distress. She has no wheezes. She has no rales.  Musculoskeletal: Normal range of motion. She exhibits tenderness. She exhibits no edema.       Reproducible tenderness across the upper back, especially over the bilateral trapezii musculature as well as the supraspinatus muscles. No spinal tenderness.  Lymphadenopathy:    She has no cervical adenopathy.  Neurological: She is alert and oriented to person, place, and time.  Skin: Skin is warm and dry. No rash noted.  Psychiatric: She has a normal mood and affect.    ED Course  Procedures (including critical care time)  Labs Reviewed - No data to display No results found.   1. Chronic sinusitis   2.  Nasal folliculitis   3. Myalgia       MDM  Omnicef 300 mg twice a day for 10 days Medrol Dosepak #21 Mupirocin nasal ointment as directed Continue saline nasal spray frequently during the day. May continue using your OTC decongestant and antihistamine combination. Drink plenty of fluids and stay well-hydrated For back pain I recommend that you adjust the height of your computer to lower position. Also avoid constant contraction of the shoulders and upper were short in position. Apply heat to the areas of soreness.         Hayden Rasmussen, NP 10/29/12 1720

## 2012-10-30 NOTE — ED Provider Notes (Signed)
Medical screening examination/treatment/procedure(s) were performed by non-physician practitioner and as supervising physician I was immediately available for consultation/collaboration.   [facility]; MD   Sharin Grave, MD 10/30/12 [phone]

## 2012-11-12 NOTE — ED Notes (Signed)
Patient called, c/o she had finished her Rx, and felt okay for a couple of days, but now feels as if she is sick again. Spoke w D Mabe, who advises pt needs to use OTC saline nasal spray, sudafed PE, guaiphenesin Q 4 h prn syx , and be rechecked if no better in few days

## 2013-02-07 ENCOUNTER — Emergency Department (INDEPENDENT_AMBULATORY_CARE_PROVIDER_SITE_OTHER)
Admission: EM | Admit: 2013-02-07 | Discharge: 2013-02-07 | Disposition: A | Discharge status: Home / Self Care | Payer: Self-pay | Source: Home / Self Care

## 2013-02-07 DIAGNOSIS — M797 Fibromyalgia: Secondary | ICD-10-CM

## 2013-02-07 DIAGNOSIS — IMO0001 Reserved for inherently not codable concepts without codable children: Secondary | ICD-10-CM

## 2013-02-07 DIAGNOSIS — M609 Myositis, unspecified: Secondary | ICD-10-CM

## 2013-02-07 MED ORDER — TRAMADOL HCL 50 MG PO TABS: 50.0000 mg | ORAL_TABLET | Freq: Four times a day (QID) | ORAL | Status: DC | PRN

## 2013-02-07 MED ORDER — TRIAMCINOLONE ACETONIDE 40 MG/ML IJ SUSP
INTRAMUSCULAR | Status: AC
  Filled 2013-02-07: qty 5

## 2013-02-07 MED ORDER — METHYLPREDNISOLONE 4 MG PO KIT: PACK | ORAL | Status: DC

## 2013-02-07 MED ORDER — TRIAMCINOLONE ACETONIDE 40 MG/ML IJ SUSP
40.0000 mg | Freq: Once | INTRAMUSCULAR | Status: AC
  Administered 2013-02-07: 40 mg via INTRAMUSCULAR

## 2013-02-07 NOTE — ED Notes (Signed)
C/o neck pain getting progressively worse since Feb.  Can't sleep at night.  Debilitating pain.

## 2013-02-07 NOTE — ED Provider Notes (Signed)
History     CSN: [id number]  Arrival date & time 02/07/13  1633   First MD Initiated Contact with Patient 02/07/13 1814      No chief complaint on file.   (Consider location/radiation/quality/duration/timing/severity/associated sxs/prior treatment) HPI  No past medical history on file.  Past Surgical History  Procedure Laterality Date  . Cholecystectomy    . Appendectomy    . Cesarean section      twice '84 and '89  . Nasal sinus surgery      twice -enlarged sinuses and cleared scar tissue  . Hernia repair  2004 and 2005    twice  same site after cholecystectomy  . Finger surgery      Family History  Problem Relation Age of Onset  . Heart disease Father     History  Substance Use Topics  . Smoking status: Current Every Day Smoker -- 1.00 packs/day    Types: Cigarettes  . Smokeless tobacco: Not on file  . Alcohol Use: No    OB History   Grav Para Term Preterm Abortions TAB SAB Ect Mult Living                  Review of Systems  Allergies  Review of patient's allergies indicates not on file.  Home Medications   Current Outpatient Rx  Name  Route  Sig  Dispense  Refill  . cefdinir (OMNICEF) 300 MG capsule   Oral   Take 1 capsule (300 mg total) by mouth 2 (two) times daily.   20 capsule   0   . methylPREDNISolone (MEDROL DOSEPAK) 4 MG tablet      As directed. Take with food.   21 tablet   0   . methylPREDNISolone (MEDROL DOSEPAK) 4 MG tablet      follow package directions   21 tablet   0   . Multiple Vitamins-Minerals (WOMENS MULTI VITAMIN & MINERAL PO)   Oral   Take by mouth.         . mupirocin nasal ointment (BACTROBAN) 2 %      Apply in each nostril daily   1 g   1   . Phenyleph-CPM-DM-Aspirin (ALKA-SELTZER PLUS COLD & COUGH PO)   Oral   Take by mouth.           BP 124/82  Pulse 92  Temp(Src) 98 F (36.7 C) (Oral)  Resp 16  SpO2 98%  Physical Exam  ED Course  Procedures (including critical care time)  Labs  Reviewed - No data to display No results found.   1. Myofasciitis   2. Fibromyalgia muscle pain       MDM  This likely she has fibromyalgia. Medrol Dosepak starting tomorrow. Today administer Kenalog 40 mg IM Call the [facility] tomorrow to obtain an are not car and appointment with them. Stop smoking        Hayden Rasmussen, NP 02/07/13 1836  Hayden Rasmussen, NP 02/07/13 [phone]

## 2013-02-07 NOTE — ED Notes (Signed)
Given packet for orange card application.  Paper for when to come to get orange card and paper where the clinic is moving.  Rx. Tramadol done by NP and given to pt.

## 2013-02-11 NOTE — ED Provider Notes (Signed)
Medical screening examination/treatment/procedure(s) were performed by resident physician or non-physician practitioner and as supervising physician I was immediately available for consultation/collaboration.   Yashvi Jasinski DOUGLAS MD.   Muhamed Luecke D Denaya Horn, MD 02/11/13 1806 

## 2013-02-16 ENCOUNTER — Ambulatory Visit: Payer: Self-pay | Attending: Family Medicine | Admitting: Family Medicine

## 2013-02-16 ENCOUNTER — Ambulatory Visit
Admission: RE | Admit: 2013-02-16 | Discharge: 2013-02-16 | Disposition: A | Discharge status: Home / Self Care | Payer: Self-pay | Source: Ambulatory Visit | Attending: Family Medicine | Admitting: Family Medicine

## 2013-02-16 VITALS — BP 167/67 | HR 78 | Temp 97.7°F | Resp 18 | Ht 62.25 in | Wt 184.0 lb

## 2013-02-16 DIAGNOSIS — F32A Depression, unspecified: Secondary | ICD-10-CM

## 2013-02-16 DIAGNOSIS — G4700 Insomnia, unspecified: Secondary | ICD-10-CM

## 2013-02-16 DIAGNOSIS — M25512 Pain in left shoulder: Secondary | ICD-10-CM

## 2013-02-16 DIAGNOSIS — R030 Elevated blood-pressure reading, without diagnosis of hypertension: Secondary | ICD-10-CM

## 2013-02-16 DIAGNOSIS — M797 Fibromyalgia: Secondary | ICD-10-CM

## 2013-02-16 DIAGNOSIS — IMO0001 Reserved for inherently not codable concepts without codable children: Secondary | ICD-10-CM | POA: Insufficient documentation

## 2013-02-16 DIAGNOSIS — F329 Major depressive disorder, single episode, unspecified: Secondary | ICD-10-CM

## 2013-02-16 DIAGNOSIS — M25519 Pain in unspecified shoulder: Secondary | ICD-10-CM | POA: Insufficient documentation

## 2013-02-16 MED ORDER — AMITRIPTYLINE HCL 25 MG PO TABS: 25.0000 mg | ORAL_TABLET | Freq: Every day | ORAL | Status: DC

## 2013-02-16 MED ORDER — TRAMADOL HCL 50 MG PO TABS: 100.0000 mg | ORAL_TABLET | Freq: Three times a day (TID) | ORAL | Status: DC | PRN

## 2013-02-16 MED ORDER — ACETAMINOPHEN 500 MG PO TABS: 500.0000 mg | ORAL_TABLET | Freq: Four times a day (QID) | ORAL | Status: DC | PRN

## 2013-02-16 MED ORDER — CYCLOBENZAPRINE HCL 5 MG PO TABS: 5.0000 mg | ORAL_TABLET | Freq: Every evening | ORAL | Status: DC | PRN

## 2013-02-16 NOTE — Patient Instructions (Signed)
Shoulder Pain The shoulder is the joint that connects your arms to your body. The bones that form the shoulder joint include the upper arm bone (humerus), the shoulder blade (scapula), and the collarbone (clavicle). The top of the humerus is shaped like a ball and fits into a rather flat socket on the scapula (glenoid cavity). A combination of muscles and strong, fibrous tissues that connect muscles to bones (tendons) support your shoulder joint and hold the ball in the socket. Small, fluid-filled sacs (bursae) are located in different areas of the joint. They act as cushions between the bones and the overlying soft tissues and help reduce friction between the gliding tendons and the bone as you move your arm. Your shoulder joint allows a wide range of motion in your arm. This range of motion allows you to do things like scratch your back or throw a ball. However, this range of motion also makes your shoulder more prone to pain from overuse and injury. Causes of shoulder pain can originate from both injury and overuse and usually can be grouped in the following four categories:  Redness, swelling, and pain (inflammation) of the tendon (tendinitis) or the bursae (bursitis).  Instability, such as a dislocation of the joint.  Inflammation of the joint (arthritis).  Broken bone (fracture). HOME CARE INSTRUCTIONS   Apply ice to the sore area.  Put ice in a plastic bag.  Place a towel between your skin and the bag.  Leave the ice on for 15 to 20 minutes, 3 to 4 times per day for the first 2 days.  If you have a shoulder sling or immobilizer, wear it as long as your caregiver instructs. Only remove it to shower or bathe. Move your arm as little as possible, but keep your hand moving to prevent swelling.  Only take over-the-counter or prescription medicines for pain, discomfort, or fever as directed by your caregiver. SEEK MEDICAL CARE IF:   Your shoulder pain increases, or new pain develops in  your arm, hand, or fingers.  Your hand or fingers become cold and numb.  Your pain is not relieved with medicines. SEEK IMMEDIATE MEDICAL CARE IF:   Your arm, hand, or fingers are numb or tingling.  Your arm, hand, or fingers are significantly swollen or turn white or blue. MAKE SURE YOU:   Understand these instructions.  Will watch your condition.  Will get help right away if you are not doing well or get worse. Document Released: 07/02/2005 Document Revised: 12/15/2011 Document Reviewed: 09/06/2011 Exit[employer] Patient Information 2013 [employer].  

## 2013-02-16 NOTE — Progress Notes (Signed)
Patient ID: Daisy [name], female   DOB: [birth year], 52 y.o.   MRN: [id number] CC:  HPI: Pt presenting as a new patient with complaints of bilateral shoulder pain.  Pt reports that this has been ongoing for 2 weeks.  Pt says that she has pain in both shoulders and in the back of neck, not associated with any injury that she is reporting today.  Pt says that she is having pain with palpation of the muscles of left shoulder and neck.  She had only minimal relief when she saw the urgent care provider and got prescription for medrol dosepak.  She was given prescription for tramadol and reported only minimal relief.   No Known Allergies Past Medical History  Diagnosis Date  . Depression    Current Outpatient Prescriptions on File Prior to Visit  Medication Sig Dispense Refill  . Multiple Vitamins-Minerals (WOMENS MULTI VITAMIN & MINERAL PO) Take by mouth.      . cefdinir (OMNICEF) 300 MG capsule Take 1 capsule (300 mg total) by mouth 2 (two) times daily.  20 capsule  0  . methylPREDNISolone (MEDROL DOSEPAK) 4 MG tablet As directed. Take with food.  21 tablet  0  . methylPREDNISolone (MEDROL DOSEPAK) 4 MG tablet follow package directions  21 tablet  0  . mupirocin nasal ointment (BACTROBAN) 2 % Apply in each nostril daily  1 g  1  . Phenyleph-CPM-DM-Aspirin (ALKA-SELTZER PLUS COLD & COUGH PO) Take by mouth.       No current facility-administered medications on file prior to visit.   Family History  Problem Relation Age of Onset  . Heart disease Father    History   Social History  . Marital Status: Legally Separated    Spouse Name: N/A    Number of Children: N/A  . Years of Education: N/A   Occupational History  . Not on file.   Social History Main Topics  . Smoking status: Current Every Day Smoker -- 1.00 packs/day    Types: Cigarettes  . Smokeless tobacco: Not on file  . Alcohol Use: No  . Drug Use: No  . Sexually Active: Yes    Birth Control/ Protection: None   Other Topics  Concern  . Not on file   Social History Narrative  . No narrative on file    Review of Systems  Constitutional: Negative for fever, chills, diaphoresis, activity change, appetite change and fatigue.  HENT: Negative for ear pain, nosebleeds, congestion, facial swelling, rhinorrhea, neck pain, neck stiffness and ear discharge.   Eyes: Negative for pain, discharge, redness, itching and visual disturbance.  Respiratory: Negative for cough, choking, chest tightness, shortness of breath, wheezing and stridor.   Cardiovascular: Negative for chest pain, palpitations and leg swelling.  Gastrointestinal: Negative for abdominal distention.  Genitourinary: Negative for dysuria, urgency, frequency, hematuria, flank pain, decreased urine volume, difficulty urinating and dyspareunia.  Musculoskeletal: Negative for back pain, joint swelling, arthralgias and gait problem.  Neurological: Negative for dizziness, tremors, seizures, syncope, facial asymmetry, speech difficulty, weakness, light-headedness, numbness and headaches.  Hematological: Negative for adenopathy. Does not bruise/bleed easily.  Psychiatric/Behavioral: Negative for hallucinations, behavioral problems, confusion, dysphoric mood, decreased concentration and agitation.    Objective:   Filed Vitals:   02/16/13 1012  BP: 167/67  Pulse: 78  Temp: 97.7 F (36.5 C)  Resp: 18    Physical Exam  Constitutional: Appears well-developed and well-nourished. No distress.  HENT: Normocephalic. External right and left ear normal. Oropharynx is clear and moist.  Eyes: Conjunctivae and EOM are normal. PERRLA, no scleral icterus.  Neck: Normal ROM. Neck supple. No JVD. No tracheal deviation. No thyromegaly.  CVS: RRR, S1/S2 +, no murmurs, no gallops, no carotid bruit.  Pulmonary: Effort and breath sounds normal, no stridor, rhonchi, wheezes, rales.  Abdominal: Soft. BS +,  no distension, tenderness, rebound or guarding.  Musculoskeletal: Normal  range of motion. No edema and no tenderness.  Lymphadenopathy: No lymphadenopathy noted, cervical, inguinal. Neuro: Alert. Normal reflexes, muscle tone coordination. No cranial nerve deficit. Skin: Skin is warm and dry. No rash noted. Not diaphoretic. No erythema. No pallor.  Psychiatric: Normal mood and affect. Behavior, judgment, thought content normal.   Lab Results  Component Value Date   WBC 15.3* 01/02/2008   HGB 14.8 01/02/2008   HCT 42.7 01/02/2008   MCV 87.4 01/02/2008   PLT 268 01/02/2008   Lab Results  Component Value Date   CREATININE 0.96 01/02/2008   BUN 7 01/02/2008   NA 143 01/02/2008   K 3.2* 01/02/2008   CL 111 01/02/2008   CO2 25 01/02/2008    No results found for this basename: HGBA1C   EKG - no acute finding reported    Assessment:   myofascial shoulder pain bilateral Fibromyalgia       Plan:     Check labs today Elevated BP noted and will follow up with repeat BP in 2 weeks EKG - no acute findings - please see tracing  Tramadol 100 mg po every 8 hours prn severe shoulder pain Acetaminophen 500 mg every 4 hours prn Flexeril 5 mg po At HS prn  Pt to get orange card and get referral to sports medicine if symptoms persist Elavil 25 mg po at HS Xray of shoulder ordered  FOLLOW UP 2 weeks   The patient was given clear instructions to go to ER or return to medical center if symptoms don't improve, worsen or new problems develop.  The patient verbalized understanding.  The patient was told to call to get lab results if they haven't heard anything in the next week.     Rodney Langton, MD, CDE, FAAFP [facility] [address], [place]

## 2013-02-16 NOTE — Progress Notes (Signed)
Patient complains of bilateral shoulder pain that started 7 days ago, but is mainly on left side of shoulder. Also; patient states pain from toes to head. States was seen in [place] and sent here with diagnosis of fibromyalgia.

## 2013-02-17 DIAGNOSIS — F4001 Agoraphobia with panic disorder: Secondary | ICD-10-CM | POA: Insufficient documentation

## 2013-02-17 DIAGNOSIS — F32A Depression, unspecified: Secondary | ICD-10-CM | POA: Insufficient documentation

## 2013-02-17 DIAGNOSIS — M797 Fibromyalgia: Secondary | ICD-10-CM | POA: Insufficient documentation

## 2013-02-17 DIAGNOSIS — IMO0001 Reserved for inherently not codable concepts without codable children: Secondary | ICD-10-CM | POA: Insufficient documentation

## 2013-02-17 DIAGNOSIS — F329 Major depressive disorder, single episode, unspecified: Secondary | ICD-10-CM | POA: Insufficient documentation

## 2013-02-17 DIAGNOSIS — G4700 Insomnia, unspecified: Secondary | ICD-10-CM | POA: Insufficient documentation

## 2013-02-17 DIAGNOSIS — R030 Elevated blood-pressure reading, without diagnosis of hypertension: Secondary | ICD-10-CM | POA: Insufficient documentation

## 2013-02-18 ENCOUNTER — Telehealth: Payer: Self-pay

## 2013-02-18 NOTE — Telephone Encounter (Signed)
pls call back

## 2013-02-18 NOTE — Telephone Encounter (Signed)
Patient not available Left message to return our call 

## 2013-02-18 NOTE — Telephone Encounter (Signed)
Spoke with patient about xray results.  

## 2013-02-23 ENCOUNTER — Telehealth: Payer: Self-pay | Admitting: General Practice

## 2013-02-23 NOTE — Telephone Encounter (Signed)
Pt called and left message saying her Tramadol script is not working. Please follow up with pt.

## 2013-02-23 NOTE — Telephone Encounter (Signed)
Patient states tramadol is not working

## 2013-02-24 ENCOUNTER — Telehealth: Payer: Self-pay | Admitting: *Deleted

## 2013-02-24 NOTE — Telephone Encounter (Signed)
Please call patient and have her come in for an appointment if tramadol is not working.    Rodney Langton, MD, CDE, FAAFP [facility] [place], [place]

## 2013-02-24 NOTE — Telephone Encounter (Signed)
02/24/13 Spoke with patient regarding tramadol will schedule appointment to be seen i Daisy [name]

## 2013-03-02 ENCOUNTER — Ambulatory Visit: Payer: Self-pay | Attending: Family Medicine | Admitting: Internal Medicine

## 2013-03-02 VITALS — BP 134/88 | HR 94 | Temp 98.6°F | Resp 14 | Ht 62.75 in | Wt 182.0 lb

## 2013-03-02 DIAGNOSIS — F32A Depression, unspecified: Secondary | ICD-10-CM

## 2013-03-02 DIAGNOSIS — IMO0001 Reserved for inherently not codable concepts without codable children: Secondary | ICD-10-CM | POA: Insufficient documentation

## 2013-03-02 DIAGNOSIS — R5381 Other malaise: Secondary | ICD-10-CM | POA: Insufficient documentation

## 2013-03-02 DIAGNOSIS — M797 Fibromyalgia: Secondary | ICD-10-CM

## 2013-03-02 DIAGNOSIS — G4700 Insomnia, unspecified: Secondary | ICD-10-CM

## 2013-03-02 DIAGNOSIS — F329 Major depressive disorder, single episode, unspecified: Secondary | ICD-10-CM

## 2013-03-02 DIAGNOSIS — R030 Elevated blood-pressure reading, without diagnosis of hypertension: Secondary | ICD-10-CM

## 2013-03-02 MED ORDER — TRAMADOL HCL 50 MG PO TABS: 100.0000 mg | ORAL_TABLET | Freq: Three times a day (TID) | ORAL | Status: DC | PRN

## 2013-03-02 MED ORDER — AMITRIPTYLINE HCL 25 MG PO TABS: 25.0000 mg | ORAL_TABLET | Freq: Every day | ORAL | Status: DC

## 2013-03-02 MED ORDER — CYCLOBENZAPRINE HCL 5 MG PO TABS: 5.0000 mg | ORAL_TABLET | Freq: Two times a day (BID) | ORAL | Status: DC | PRN

## 2013-03-02 NOTE — Progress Notes (Signed)
Patient ID: Daisy [name], female   DOB: 1961/02/13, 52 y.o.   MRN: [id number] Patient Demographics  Daisy [name], is a 52 y.o. female  [id number]:[id number]  [id number]:[id number]  DOB - [birth year]  Chief Complaint  Patient presents with  . Follow-up    FU for change of medication and pain in shoulders.        Subjective:   Daisy [name] today is here for a follow up visit. Patient has No headache, No chest pain, No abdominal pain - No Nausea, No Cough - SOB.  Patient reports fatigue, muscle spasms and being really tired after work every day. She also reports that she used to be on Synthroid but once she lost her insurance she has not taken it. Patient reports that Flexeril really helped with the muscle spasms. She is also very tearful and depressed about her son's passing away 5 years ago but does not want any antidepressants    Objective:    Filed Vitals:   03/02/13 1602  BP: 134/88  Pulse: 94  Temp: 98.6 F (37 C)  TempSrc: Oral  Resp: 14  Height: 5' 2.75" (1.594 m)  Weight: 182 lb (82.555 kg)  SpO2: 97%     ALLERGIES:  No Known Allergies  PAST MEDICAL HISTORY: Past Medical History  Diagnosis Date  . Depression     MEDICATIONS AT HOME: Prior to Admission medications   Medication Sig Start Date End Date Taking? Authorizing Provider  amitriptyline (ELAVIL) 25 MG tablet Take 1 tablet (25 mg total) by mouth at bedtime. 02/16/13  Yes Clanford Cyndie Mull, MD  cyclobenzaprine (FLEXERIL) 5 MG tablet Take 1 tablet (5 mg total) by mouth at bedtime as needed for muscle spasms. 02/16/13  Yes Clanford Cyndie Mull, MD  methylPREDNISolone (MEDROL DOSEPAK) 4 MG tablet follow package directions 02/07/13  Yes Hayden Rasmussen, NP  Multiple Vitamins-Minerals (WOMENS MULTI VITAMIN & MINERAL PO) Take by mouth.   Yes Historical Provider, MD  traMADol (ULTRAM) 50 MG tablet Take 2 tablets (100 mg total) by mouth every 8 (eight) hours as needed for pain. 02/16/13  Yes Clanford Cyndie Mull, MD      Exam  General appearance :Awake, alert, NAD, Speech Clear.  HEENT: Atraumatic and Normocephalic, PERLA Neck: supple, no JVD. No cervical lymphadenopathy.  Chest: Clear to auscultation bilaterally, no wheezing, rales or rhonchi CVS: S1 S2 regular, no murmurs.  Abdomen: soft, NBS, NT, ND, no gaurding, rigidity or rebound. Extremities: no cyanosis or clubbing, B/L Lower Ext shows no edema Neurology: Awake alert, and oriented X 3, CN II-XII intact, Non focal Skin: No Rash or lesions Wounds:N/A    Data Review   Basic Metabolic Panel: No results found for this basename: NA, K, CL, CO2, GLUCOSE, BUN, CREATININE, CALCIUM, MG, PHOS,  in the last 168 hours Liver Function Tests: No results found for this basename: AST, ALT, ALKPHOS, BILITOT, PROT, ALBUMIN,  in the last 168 hours  CBC: No results found for this basename: WBC, NEUTROABS, HGB, HCT, MCV, PLT,  in the last 168 hours  ------------------------------------------------------------------------------------------------------------------ No results found for this basename: HGBA1C,  in the last 72 hours ------------------------------------------------------------------------------------------------------------------ No results found for this basename: CHOL, HDL, LDLCALC, TRIG, CHOLHDL, LDLDIRECT,  in the last 72 hours ------------------------------------------------------------------------------------------------------------------ No results found for this basename: TSH, T4TOTAL, FREET3, T3FREE, THYROIDAB,  in the last 72 hours ------------------------------------------------------------------------------------------------------------------ No results found for this basename: VITAMINB12, FOLATE, FERRITIN, TIBC, IRON, RETICCTPCT,  in the last 72 hours  Coagulation profile  No results found for this  basename: INR, PROTIME,  in the last 168 hours    Assessment & Plan   Active Problems: Muscle spasms/fibromyalgia: - Refilled  Flexeril and tramadol  Fatigue and generalized malaise: Patient also has constipation, weight gain, she used to be on Synthroid but has not taken it since she lost her insurance - I will check her TSH today, will need to be restarted on Synthroid replacement  Anxiety/depression: Patient does not want to be on any antidepressants  Recommendations: Check TSH today Follow-up in 3 months     Dawnn Nam M.D. 03/02/2013, 4:39 PM

## 2013-03-06 ENCOUNTER — Encounter: Payer: Self-pay | Admitting: Internal Medicine

## 2013-03-11 ENCOUNTER — Telehealth: Payer: Self-pay | Admitting: Family Medicine

## 2013-03-11 NOTE — Telephone Encounter (Signed)
VM from 03/09/13: Called about thyroid lab results from 5/28, has not received results.

## 2013-04-07 ENCOUNTER — Telehealth: Payer: Self-pay | Admitting: Family Medicine

## 2013-04-07 NOTE — Telephone Encounter (Signed)
Please inform patient that her TSH level was elevated.  She does need to take her thyroid hormone replacement medication.

## 2013-04-07 NOTE — Telephone Encounter (Signed)
Calling about lab results for tyroid check and would like refill for pain meds. Please f/u.

## 2013-04-13 ENCOUNTER — Telehealth: Payer: Self-pay

## 2013-04-18 ENCOUNTER — Telehealth: Payer: Self-pay | Admitting: Family Medicine

## 2013-04-18 NOTE — Telephone Encounter (Signed)
Pt calling about lab results. Please f/u with pt.  °

## 2013-04-18 NOTE — Telephone Encounter (Signed)
Please review lab and advise Patient would like results

## 2013-04-27 ENCOUNTER — Telehealth: Payer: Self-pay | Admitting: *Deleted

## 2013-04-27 NOTE — Telephone Encounter (Signed)
04/26/13 Patient made aware of lab results TSH level elevated . Instructed to take her thyroid medication. Patient stated she need thyroid medication prescription called to [facility] on [address]Also patient stated that Ultram is not managing her pain requesting a different pain medication. P.Merrel Crabbe,RN BSN [facility]

## 2013-04-29 ENCOUNTER — Ambulatory Visit: Payer: Self-pay | Attending: Family Medicine | Admitting: Internal Medicine

## 2013-04-29 VITALS — BP 159/94 | HR 91 | Temp 98.4°F | Resp 16 | Ht 62.0 in | Wt 185.0 lb

## 2013-04-29 DIAGNOSIS — E039 Hypothyroidism, unspecified: Secondary | ICD-10-CM

## 2013-04-29 DIAGNOSIS — F32A Depression, unspecified: Secondary | ICD-10-CM

## 2013-04-29 DIAGNOSIS — M797 Fibromyalgia: Secondary | ICD-10-CM

## 2013-04-29 DIAGNOSIS — R030 Elevated blood-pressure reading, without diagnosis of hypertension: Secondary | ICD-10-CM

## 2013-04-29 DIAGNOSIS — G894 Chronic pain syndrome: Secondary | ICD-10-CM

## 2013-04-29 DIAGNOSIS — F329 Major depressive disorder, single episode, unspecified: Secondary | ICD-10-CM

## 2013-04-29 DIAGNOSIS — IMO0001 Reserved for inherently not codable concepts without codable children: Secondary | ICD-10-CM | POA: Insufficient documentation

## 2013-04-29 MED ORDER — LEVOTHYROXINE SODIUM 50 MCG PO TABS: 50.0000 ug | ORAL_TABLET | Freq: Every day | ORAL | Status: DC

## 2013-04-29 MED ORDER — PREDNISONE 20 MG PO TABS: 20.0000 mg | ORAL_TABLET | Freq: Every day | ORAL | Status: DC

## 2013-04-29 MED ORDER — CYCLOBENZAPRINE HCL 5 MG PO TABS: 10.0000 mg | ORAL_TABLET | Freq: Two times a day (BID) | ORAL | Status: DC | PRN

## 2013-04-29 MED ORDER — TRAMADOL HCL 50 MG PO TABS: 100.0000 mg | ORAL_TABLET | Freq: Four times a day (QID) | ORAL | Status: DC | PRN

## 2013-04-29 NOTE — Progress Notes (Signed)
PT HERE TO F/U FOR THYROID MEDICATION. STATES HASN'T  TAKEN SINCE 2006.C/O INCREASED FATIGUE,WEAKNESS LAST TSH LEVEL 02/2013 HX CHRONIC FIBROMYALGIA.NEED MEDS

## 2013-04-29 NOTE — Patient Instructions (Addendum)
Please call on Tuesday 7/29 or Wed 7/30 about any improvement in your symptoms after Prednisone trial.  Call [phone] or [phone]

## 2013-04-29 NOTE — Progress Notes (Signed)
PT INSTRUCTED TO CALL NEXT Monday or Tuesday and ask for [facility] RN, regarding Prednisone trial progress. Per Dr. Isidoro Donning

## 2013-04-29 NOTE — Progress Notes (Signed)
Patient ID: Daisy [name], female   DOB: [birth year], 52 y.o.   MRN: [id number] Patient Demographics  Daisy [name], is a 52 y.o. female  WGN:[id number]  HQI:[id number]  DOB - Apr 24, 1961  Chief Complaint  Patient presents with  . Follow-up    TSH LEVEL,CHRONIC PAIN MANAGEMENT        Subjective:   Daisy [name] today is here for a follow up visit. Patient has No headache, No chest pain, No abdominal pain - No Nausea,No Cough - SOB.  TSH elevated at 8.1, she used to be on Synthroid, (does not remember her dose), stopped in 2007 when she lost her insurance.  In the last year she has been experiencing weight gain, fatigue and depression. Patient also frustrated with her chronic aches, stiffness and myalgias every day. She states that tramadol and Flexeril is not helping. Patient stated that she had taken prednisone in April for her sinusitis which did improve her symptoms.       Objective:    Filed Vitals:   04/29/13 1227  BP: 159/94  Pulse: 91  Temp: 98.4 F (36.9 C)  TempSrc: Oral  Resp: 16  Height: 5\' 2"  (1.575 m)  Weight: 185 lb (83.915 kg)  SpO2: 97%     ALLERGIES:  No Known Allergies  PAST MEDICAL HISTORY: Past Medical History  Diagnosis Date  . Depression     MEDICATIONS AT HOME: Prior to Admission medications   Medication Sig Start Date End Date Taking? Authorizing Provider  amitriptyline (ELAVIL) 25 MG tablet Take 1 tablet (25 mg total) by mouth at bedtime. 03/02/13  Yes Samaad Hashem Jenna Luo, MD  cyclobenzaprine (FLEXERIL) 5 MG tablet Take 2 tablets (10 mg total) by mouth 2 (two) times daily as needed for muscle spasms. 04/29/13  Yes Duncan Alejandro Jenna Luo, MD  Multiple Vitamins-Minerals (WOMENS MULTI VITAMIN & MINERAL PO) Take by mouth.   Yes Historical Provider, MD  traMADol (ULTRAM) 50 MG tablet Take 2 tablets (100 mg total) by mouth every 6 (six) hours as needed for pain. 04/29/13  Yes Chrisa Hassan Jenna Luo, MD  levothyroxine (SYNTHROID, LEVOTHROID) 50 MCG tablet Take 1 tablet  (50 mcg total) by mouth daily before breakfast. 04/29/13   Eldean Klatt Jenna Luo, MD  predniSONE (DELTASONE) 20 MG tablet Take 1 tablet (20 mg total) by mouth daily. X 3 days 04/29/13   Ace Bergfeld Jenna Luo, MD     Exam  General appearance :Awake, alert, NAD, Speech Clear.  HEENT: Atraumatic and Normocephalic, PERLA Neck: supple, no JVD. No cervical lymphadenopathy.  Chest: Clear to auscultation bilaterally, no wheezing, rales or rhonchi CVS: S1 S2 regular, no murmurs.  Abdomen: soft, NBS, NT, ND, no gaurding, rigidity or rebound. Extremities: no cyanosis or clubbing, B/L Lower Ext shows no edema Neurology: Awake alert, and oriented X 3, CN II-XII intact, Non focal Skin: No Rash or lesions Wounds:N/A    Data Review   Basic Metabolic Panel: No results found for this basename: NA, K, CL, CO2, GLUCOSE, BUN, CREATININE, CALCIUM, MG, PHOS,  in the last 168 hours Liver Function Tests: No results found for this basename: AST, ALT, ALKPHOS, BILITOT, PROT, ALBUMIN,  in the last 168 hours  CBC: No results found for this basename: WBC, NEUTROABS, HGB, HCT, MCV, PLT,  in the last 168 hours  ------------------------------------------------------------------------------------------------------------------ No results found for this basename: HGBA1C,  in the last 72 hours ------------------------------------------------------------------------------------------------------------------ No results found for this basename: CHOL, HDL, LDLCALC, TRIG, CHOLHDL, LDLDIRECT,  in the last 72 hours ------------------------------------------------------------------------------------------------------------------ No results  found for this basename: TSH, T4TOTAL, FREET3, T3FREE, THYROIDAB,  in the last 72 hours ------------------------------------------------------------------------------------------------------------------ No results found for this basename: VITAMINB12, FOLATE, FERRITIN, TIBC, IRON, RETICCTPCT,  in the last  72 hours  Coagulation profile  No results found for this basename: INR, PROTIME,  in the last 168 hours    Assessment & Plan   Active Problems: Hypothyroidism: TSH 8.1 - Check total T3, free T4, start on levothyroxine 50 MCG daily, recheck TSH in 4 weeks   Chronic pain: Patient reports that she was diagnosed as fibromyalgia in the urgent care. However given her temporary improvement with prednisone (which was prescribed for sinusitis in April), pain and stiffness. No headache or any jaw pain, no blurry vision - Will check ESR, CRP, prednisone trial 20 mg daily for 3 days - Patient will call on Wednesday 7/30 to let us know if she had any improvement in the symptoms after the prednisone -Ambulatory referral to rheumatology sent - For now continue Flexeril and tramadol.   Recommendations: follow up on Wednesday regarding any improvement in the symptoms  She has an appointment on 05/25/13     Elsa Ploch M.D. 04/29/2013, 1:23 PM

## 2013-04-30 LAB — T4, FREE: Free T4: 1.11 ng/dL (ref 0.80–1.80)

## 2013-04-30 LAB — C-REACTIVE PROTEIN: CRP: 0.6 mg/dL — ABNORMAL HIGH (ref <0.60)

## 2013-04-30 LAB — SEDIMENTATION RATE: Sed Rate: 14 mm/hr (ref 0–22)

## 2013-05-06 ENCOUNTER — Ambulatory Visit: Payer: Self-pay

## 2013-05-06 ENCOUNTER — Telehealth: Payer: Self-pay

## 2013-05-06 NOTE — Telephone Encounter (Signed)
Message copied by Lestine Mount on Fri May 06, 2013 10:27 AM ------      Message from: RAI, Andres Labrum K      Created: Fri May 06, 2013  7:31 AM       Did patient call about the prednisone trial, if it helped? If it did help, she needs referral to rheumatology. ------

## 2013-05-06 NOTE — Progress Notes (Signed)
Quick Note:  Did patient call about the prednisone trial, if it helped? If it did help, she needs referral to rheumatology. ______

## 2013-05-06 NOTE — Telephone Encounter (Signed)
Left message on machine to return our call. 

## 2013-05-30 ENCOUNTER — Ambulatory Visit: Payer: Self-pay

## 2013-06-02 ENCOUNTER — Ambulatory Visit: Payer: Self-pay | Attending: Internal Medicine | Admitting: Internal Medicine

## 2013-06-02 VITALS — BP 144/89 | HR 100 | Temp 97.7°F | Resp 17 | Wt 179.6 lb

## 2013-06-02 DIAGNOSIS — F32A Depression, unspecified: Secondary | ICD-10-CM

## 2013-06-02 DIAGNOSIS — F329 Major depressive disorder, single episode, unspecified: Secondary | ICD-10-CM

## 2013-06-02 DIAGNOSIS — R52 Pain, unspecified: Secondary | ICD-10-CM | POA: Insufficient documentation

## 2013-06-02 DIAGNOSIS — M797 Fibromyalgia: Secondary | ICD-10-CM

## 2013-06-02 DIAGNOSIS — IMO0001 Reserved for inherently not codable concepts without codable children: Secondary | ICD-10-CM

## 2013-06-02 DIAGNOSIS — E039 Hypothyroidism, unspecified: Secondary | ICD-10-CM

## 2013-06-02 DIAGNOSIS — G894 Chronic pain syndrome: Secondary | ICD-10-CM

## 2013-06-02 DIAGNOSIS — R030 Elevated blood-pressure reading, without diagnosis of hypertension: Secondary | ICD-10-CM | POA: Insufficient documentation

## 2013-06-02 MED ORDER — CYCLOBENZAPRINE HCL 10 MG PO TABS: 10.0000 mg | ORAL_TABLET | Freq: Two times a day (BID) | ORAL | Status: DC | PRN

## 2013-06-02 MED ORDER — AMITRIPTYLINE HCL 50 MG PO TABS: 50.0000 mg | ORAL_TABLET | Freq: Every day | ORAL | Status: DC

## 2013-06-02 MED ORDER — TRAMADOL HCL 50 MG PO TABS: 100.0000 mg | ORAL_TABLET | Freq: Four times a day (QID) | ORAL | Status: DC | PRN

## 2013-06-02 NOTE — Progress Notes (Signed)
Patient ID: Daisy [name], female   DOB: 02-20-1961, 52 y.o.   MRN: [id number]  CC: Followup  HPI: Patient is a 52 years old woman who came in today for followup visit. There is no new complaint, still has generalized body pain. Claims that she does not sleep because of pain. She is also here today for repeat blood check for her thyroid function. No leg swelling. She was diagnosed with fibromyalgia in our urgent care patient, has since been on different medications including trial steroid but no significant relief  No Known Allergies Past Medical History  Diagnosis Date  . Depression    Current Outpatient Prescriptions on File Prior to Visit  Medication Sig Dispense Refill  . levothyroxine (SYNTHROID, LEVOTHROID) 50 MCG tablet Take 1 tablet (50 mcg total) by mouth daily before breakfast.  30 tablet  3  . Multiple Vitamins-Minerals (WOMENS MULTI VITAMIN & MINERAL PO) Take by mouth.      . predniSONE (DELTASONE) 20 MG tablet Take 1 tablet (20 mg total) by mouth daily. X 3 days  3 tablet  0   No current facility-administered medications on file prior to visit.   Family History  Problem Relation Age of Onset  . Heart disease Father    History   Social History  . Marital Status: Legally Separated    Spouse Name: N/A    Number of Children: N/A  . Years of Education: N/A   Occupational History  . Not on file.   Social History Main Topics  . Smoking status: Current Every Day Smoker -- 1.00 packs/day    Types: Cigarettes  . Smokeless tobacco: Not on file  . Alcohol Use: No  . Drug Use: No  . Sexual Activity: Yes    Birth Control/ Protection: None   Other Topics Concern  . Not on file   Social History Narrative  . No narrative on file    Review of Systems: Constitutional: Negative for fever, chills, diaphoresis, activity change, appetite change and fatigue. HENT: Negative for ear pain, nosebleeds, congestion, facial swelling, rhinorrhea, neck pain, neck stiffness and ear  discharge.  Eyes: Negative for pain, discharge, redness, itching and visual disturbance. Respiratory: Negative for cough, choking, chest tightness, shortness of breath, wheezing and stridor.  Cardiovascular: Negative for chest pain, palpitations and leg swelling. Gastrointestinal: Negative for abdominal distention. Genitourinary: Negative for dysuria, urgency, frequency, hematuria, flank pain, decreased urine volume, difficulty urinating and dyspareunia.  Musculoskeletal: Negative for back pain, joint swelling, arthralgias and gait problem. Neurological: Negative for dizziness, tremors, seizures, syncope, facial asymmetry, speech difficulty, weakness, light-headedness, numbness and headaches.  Hematological: Negative for adenopathy. Does not bruise/bleed easily. Psychiatric/Behavioral: Negative for hallucinations, behavioral problems, confusion, dysphoric mood, decreased concentration and agitation.    Objective:   Filed Vitals:   06/02/13 1627  BP: 144/89  Pulse: 100  Temp: 97.7 F (36.5 C)  Resp: 17    Physical Exam: Constitutional: Patient appears well-developed and well-nourished. No distress. HENT: Normocephalic, atraumatic, External right and left ear normal. Oropharynx is clear and moist.  Eyes: Conjunctivae and EOM are normal. PERRLA, no scleral icterus. Neck: Normal ROM. Neck supple. No JVD. No tracheal deviation. No thyromegaly. CVS: RRR, S1/S2 +, no murmurs, no gallops, no carotid bruit.  Pulmonary: Effort and breath sounds normal, no stridor, rhonchi, wheezes, rales.  Abdominal: Soft. BS +,  no distension, tenderness, rebound or guarding.  Musculoskeletal: Normal range of motion. No edema and no tenderness.  Lymphadenopathy: No lymphadenopathy noted, cervical, inguinal or axillary  Neuro: Alert. Normal reflexes, muscle tone coordination. No cranial nerve deficit. Skin: Skin is warm and dry. No rash noted. Not diaphoretic. No erythema. No pallor. Psychiatric: Normal mood  and affect. Behavior, judgment, thought content normal.  Lab Results  Component Value Date   WBC 15.3* 01/02/2008   HGB 14.8 01/02/2008   HCT 42.7 01/02/2008   MCV 87.4 01/02/2008   PLT 268 01/02/2008   Lab Results  Component Value Date   CREATININE 0.96 01/02/2008   BUN 7 01/02/2008   NA 143 01/02/2008   K 3.2* 01/02/2008   CL 111 01/02/2008   CO2 25 01/02/2008    No results found for this basename: HGBA1C   Lipid Panel  No results found for this basename: chol, trig, hdl, cholhdl, vldl, ldlcalc       Assessment and plan:   Patient Active Problem List   Diagnosis Date Noted  . Prehypertension 06/02/2013  . Unspecified hypothyroidism 04/29/2013  . Chronic pain syndrome 04/29/2013  . Other malaise and fatigue 03/02/2013  . Elevated BP 02/17/2013  . Depression 02/17/2013  . Fibromyalgia 02/17/2013  . Insomnia 02/17/2013   Refill the following medications: Tramadol 100 mg tablet by mouth 3 times a day when necessary pain Flexeril increase to 10 mg tablet by mouth 3 times a day Increase amitriptyline to 50 mg tablet by mouth each bedtime Patient extensively counseled about pain Patient extensively counseled about smoking cessation  Blood pressure is high today, and currently blood pressure check at home and keep a log and bring to the clinic next visit  Patient encouraged to keep the appointment with the rheumatologist  [name] was given clear instructions to go to ER or return to the clinic if symptoms don't improve, worsen or new problems develop.  [name] verbalized understanding.  [name] was told to call to get lab results if hasn't heard anything in the next week.     Jeanann Lewandowsky, MD [facility] [place], [place] [phone]   06/02/2013, 5:18 PM

## 2013-06-02 NOTE — Progress Notes (Signed)
Patient is here for follow up-hypo-thyroid

## 2013-06-02 NOTE — Patient Instructions (Signed)

## 2013-06-03 ENCOUNTER — Telehealth: Payer: Self-pay | Admitting: Emergency Medicine

## 2013-06-03 LAB — T4, FREE: Free T4: 1.18 ng/dL (ref 0.80–1.80)

## 2013-06-03 NOTE — Telephone Encounter (Signed)
Message copied by Darlis Loan on Fri Jun 03, 2013  4:13 PM ------      Message from: Quentin Angst      Created: Fri Jun 03, 2013  9:47 AM       Please inform patient that thyroid function came back within normal limit. To continue the current medication ------

## 2013-06-03 NOTE — Telephone Encounter (Signed)
Left message on voicemail with normal thyroid results

## 2013-06-15 ENCOUNTER — Telehealth: Payer: Self-pay | Admitting: Emergency Medicine

## 2013-06-15 ENCOUNTER — Other Ambulatory Visit: Payer: Self-pay | Admitting: Emergency Medicine

## 2013-06-15 MED ORDER — LEVOTHYROXINE SODIUM 50 MCG PO TABS: 50.0000 ug | ORAL_TABLET | Freq: Every day | ORAL | Status: DC

## 2013-06-15 NOTE — Telephone Encounter (Signed)
PT CALLING FOR RX REFILL LEVOTHYROXINE AND REQUESTING DOSAGE INCREASE TRAMADOL 50MG  TO 100MG . STATES DR. Hyman Hopes AGREED TO CHANGE.WILL SPEAK WITH PROVIDER AND REORDER SYNTHROID

## 2013-06-15 NOTE — Telephone Encounter (Signed)
LEFT MESSAGE ON VOICEMAIL REGARDING TRAMADOL DOSAGE. PT TO CONTINUE TAKING 50MG  BID PER DR. Hyman Hopes WILL SEND SCRIPT SYNTHROID TO PHARM

## 2013-06-15 NOTE — Telephone Encounter (Signed)
Do not have tramadol in 100 mg tablet in the system, that is the reason for prescribing 50 mg tablet, patient to take 2 tablets at a time as prescribed.

## 2013-08-02 ENCOUNTER — Ambulatory Visit: Payer: Self-pay | Admitting: Family Medicine

## 2013-08-05 ENCOUNTER — Ambulatory Visit: Payer: Self-pay

## 2013-08-09 ENCOUNTER — Other Ambulatory Visit: Payer: Self-pay | Admitting: Internal Medicine

## 2013-12-28 ENCOUNTER — Encounter (HOSPITAL_COMMUNITY): Payer: Self-pay | Admitting: Emergency Medicine

## 2013-12-28 ENCOUNTER — Emergency Department (HOSPITAL_COMMUNITY)
Admission: EM | Admit: 2013-12-28 | Discharge: 2013-12-28 | Disposition: A | Discharge status: Home / Self Care | Payer: No Typology Code available for payment source | Source: Home / Self Care | Attending: Family Medicine | Admitting: Family Medicine

## 2013-12-28 DIAGNOSIS — J069 Acute upper respiratory infection, unspecified: Secondary | ICD-10-CM

## 2013-12-28 DIAGNOSIS — J019 Acute sinusitis, unspecified: Secondary | ICD-10-CM

## 2013-12-28 MED ORDER — GUAIFENESIN-CODEINE 100-10 MG/5ML PO SOLN: 5.0000 mL | ORAL | Status: DC | PRN

## 2013-12-28 MED ORDER — AMOXICILLIN-POT CLAVULANATE 875-125 MG PO TABS: 1.0000 | ORAL_TABLET | Freq: Two times a day (BID) | ORAL | Status: DC

## 2013-12-28 NOTE — ED Provider Notes (Signed)
CSN: [id number]     Arrival date & time 12/28/13  1535 History   First MD Initiated Contact with Patient 12/28/13 1706     Chief Complaint  Patient presents with  . Cough   (Consider location/radiation/quality/duration/timing/severity/associated sxs/prior Treatment) HPI Patient is a 53 yo F presenting to [facility] with cold like symptoms for the last 7-10 days. Patient reports congestion, face pain, fever (100.8 Tmax yesterday), and progressively worsening dry cough for last 5 days. Cough is non-productive.  Patient has a history of sinus surgery x2 and has multiple sinus infections. Face pain is most concerning symptom for her at this time. 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  Past Medical History  Diagnosis Date  . Depression    Past Surgical History  Procedure Laterality Date  . Cholecystectomy    . Appendectomy    . Cesarean section      twice '84 and '89  . Nasal sinus surgery      twice -enlarged sinuses and cleared scar tissue  . Hernia repair  2004 and 2005    twice  same site after cholecystectomy  . Finger surgery     Family History  Problem Relation Age of Onset  . Heart disease Father    History  Substance Use Topics  . Smoking status: Current Every Day Smoker -- 1.00 packs/day    Types: Cigarettes  . Smokeless tobacco: Not on file  . Alcohol Use: No   OB History   Grav Para Term Preterm Abortions TAB SAB Ect Mult Living                 Review of Systems  Constitutional: Positive for fever and chills.  HENT: Positive for congestion, ear pain, postnasal drip, rhinorrhea, sinus pressure and sore throat. Negative for dental problem and trouble swallowing.   Eyes: Negative for visual disturbance.  Respiratory: Positive for cough. Negative for shortness of breath.   Cardiovascular: Negative for chest pain and leg swelling.  Gastrointestinal: Negative for nausea, abdominal pain and diarrhea.  Genitourinary: Negative for dysuria.  Musculoskeletal:  Negative for arthralgias and myalgias.  Skin: Negative for rash.  Neurological: Negative for headaches.    Allergies  Review of patient's allergies indicates no known allergies.  Home Medications   Current Outpatient Rx  Name  Route  Sig  Dispense  Refill  . amitriptyline (ELAVIL) 50 MG tablet   Oral   Take 1 tablet (50 mg total) by mouth at bedtime.   30 tablet   1   . amoxicillin-clavulanate (AUGMENTIN) 875-125 MG per tablet   Oral   Take 1 tablet by mouth 2 (two) times daily.   10 tablet   0   . cyclobenzaprine (FLEXERIL) 10 MG tablet   Oral   Take 1 tablet (10 mg total) by mouth 2 (two) times daily as needed for muscle spasms.   60 tablet   2   . guaiFENesin-codeine 100-10 MG/5ML syrup   Oral   Take 5 mLs by mouth every 4 (four) hours as needed for cough.   120 mL   0   . levothyroxine (SYNTHROID, LEVOTHROID) 50 MCG tablet   Oral   Take 1 tablet (50 mcg total) by mouth daily before breakfast.   30 tablet   3   . Multiple Vitamins-Minerals (WOMENS MULTI VITAMIN & MINERAL PO)   Oral   Take by mouth.         . traMADol (ULTRAM) 50 MG tablet  Oral   Take 2 tablets (100 mg total) by mouth every 6 (six) hours as needed for pain.   60 tablet   3    BP 157/96  Pulse 96  Temp(Src) 98.7 F (37.1 C) (Oral)  Resp 20  SpO2 98% Physical Exam  Constitutional: She is oriented to person, place, and time. She appears well-developed and well-nourished. No distress.  HENT:  Head: Normocephalic and atraumatic.  Right Ear: Tympanic membrane normal.  Left Ear: Tympanic membrane normal.  Nose: Mucosal edema present.  Mouth/Throat: Mucous membranes are normal. Posterior oropharyngeal erythema present.  TTP of frontal and maxillary sinuses with light palpation  Neck: Normal range of motion. Neck supple.  Cardiovascular: Normal rate, regular rhythm and normal heart sounds.   Pulmonary/Chest: Effort normal and breath sounds normal. She has no wheezes.  + cough   Abdominal: Soft. She exhibits no distension. There is no tenderness.  Musculoskeletal: Normal range of motion. She exhibits no edema and no tenderness.  Lymphadenopathy:    She has cervical adenopathy.  Neurological: She is alert and oriented to person, place, and time.  Skin: Skin is warm and dry.  Psychiatric: She has a normal mood and affect.    ED Course  Procedures (including critical care time) Labs Review Labs Reviewed - No data to display Imaging Review No results found.  MDM   1. Acute rhinosinusitis   2. Upper respiratory infection    53 yo with >1 week history of URI symptoms and history of multiple sinus surgeries, presenting with new fever and face pain most likely due to acute sinusitis. - Augmentin BID x5 days - Cough medicine with codeine prn cough at night - Tylenol for fevers - f/u prn or if symptoms worsen    Montez Morita, MD 12/28/13 [phone]

## 2013-12-28 NOTE — ED Provider Notes (Signed)
Medical screening examination/treatment/procedure(s) were performed by a resident physician or non-physician practitioner and as the supervising physician I was immediately available for consultation/collaboration.  Lynne Leader, MD    Gregor Hams, MD 12/28/13 [phone]

## 2013-12-28 NOTE — Discharge Instructions (Signed)

## 2013-12-28 NOTE — ED Notes (Signed)
C/o URI type illness x 1 week +, minimal relief w OTC medciations

## 2014-01-23 ENCOUNTER — Other Ambulatory Visit (HOSPITAL_COMMUNITY)
Admission: RE | Admit: 2014-01-23 | Discharge: 2014-01-23 | Disposition: A | Discharge status: Home / Self Care | Payer: No Typology Code available for payment source | Source: Ambulatory Visit | Attending: Family Medicine | Admitting: Family Medicine

## 2014-01-23 ENCOUNTER — Other Ambulatory Visit: Payer: Self-pay | Admitting: Family Medicine

## 2014-01-23 DIAGNOSIS — Z01419 Encounter for gynecological examination (general) (routine) without abnormal findings: Secondary | ICD-10-CM | POA: Insufficient documentation

## 2014-01-27 ENCOUNTER — Encounter (HOSPITAL_COMMUNITY): Payer: Self-pay | Admitting: Emergency Medicine

## 2014-01-27 ENCOUNTER — Emergency Department (HOSPITAL_COMMUNITY)
Admission: EM | Admit: 2014-01-27 | Discharge: 2014-01-27 | Disposition: A | Discharge status: Home / Self Care | Payer: No Typology Code available for payment source | Source: Home / Self Care | Attending: Family Medicine | Admitting: Family Medicine

## 2014-01-27 DIAGNOSIS — K0889 Other specified disorders of teeth and supporting structures: Secondary | ICD-10-CM

## 2014-01-27 DIAGNOSIS — K089 Disorder of teeth and supporting structures, unspecified: Secondary | ICD-10-CM

## 2014-01-27 HISTORY — DX: Disorder of thyroid, unspecified: E07.9

## 2014-01-27 MED ORDER — SODIUM CHLORIDE 0.9 % IV SOLN: Freq: Once | INTRAVENOUS | Status: DC

## 2014-01-27 MED ORDER — DICLOFENAC POTASSIUM 50 MG PO TABS: 50.0000 mg | ORAL_TABLET | Freq: Three times a day (TID) | ORAL | Status: DC

## 2014-01-27 MED ORDER — AMOXICILLIN 500 MG PO CAPS: 500.0000 mg | ORAL_CAPSULE | Freq: Three times a day (TID) | ORAL | Status: DC

## 2014-01-27 MED ORDER — ASPIRIN 81 MG PO CHEW: 243.0000 mg | CHEWABLE_TABLET | Freq: Once | ORAL | Status: DC

## 2014-01-27 NOTE — Discharge Instructions (Signed)
Use clove oil on cotton ball as needed for socket pain, take medicine as prescribed and see dr Mariel Sleet on mon for recheck.

## 2014-01-27 NOTE — ED Notes (Signed)
Patient reports having front, bottom, right tooth pulled last Saturday ( 4/18 )tooth was [place] [facility].  Monday patient felt she was hurting too much.  Since Monday pain has worsened.  Right side of face is swollen.

## 2014-01-27 NOTE — ED Provider Notes (Signed)
CSN: [id number]     Arrival date & time 01/27/14  1423 History   First MD Initiated Contact with Patient 01/27/14 1516     Chief Complaint  Patient presents with  . Dental Pain   (Consider location/radiation/quality/duration/timing/severity/associated sxs/prior Treatment) Patient is a 53 y.o. female presenting with tooth pain. The history is provided by the patient.  Dental Pain Location:  Lower Lower teeth location:  27/RL cuspid Quality:  Aching and throbbing Severity:  Moderate Onset quality:  Gradual Duration:  6 days Progression:  Worsening Chronicity:  New Context: poor dentition and recent dental surgery   Relieved by:  Nothing Worsened by:  Nothing tried Associated symptoms: facial swelling     Past Medical History  Diagnosis Date  . Depression   . Thyroid disease    Past Surgical History  Procedure Laterality Date  . Cholecystectomy    . Appendectomy    . Cesarean section      twice '84 and '89  . Nasal sinus surgery      twice -enlarged sinuses and cleared scar tissue  . Hernia repair  2004 and 2005    twice  same site after cholecystectomy  . Finger surgery     Family History  Problem Relation Age of Onset  . Heart disease Father    History  Substance Use Topics  . Smoking status: Current Every Day Smoker -- 1.00 packs/day    Types: Cigarettes  . Smokeless tobacco: Not on file  . Alcohol Use: No   OB History   Grav Para Term Preterm Abortions TAB SAB Ect Mult Living                 Review of Systems  Constitutional: Negative.   HENT: Positive for dental problem and facial swelling.     Allergies  Review of patient's allergies indicates no known allergies.  Home Medications   Prior to Admission medications   Medication Sig Start Date End Date Taking? Authorizing Provider  acetaminophen (TYLENOL) 325 MG tablet Take 650 mg by mouth every 6 (six) hours as needed.   Yes Historical Provider, MD  ibuprofen (ADVIL,MOTRIN) 100 MG/5ML suspension  Take 200 mg by mouth every 4 (four) hours as needed.   Yes Historical Provider, MD  amitriptyline (ELAVIL) 50 MG tablet Take 1 tablet (50 mg total) by mouth at bedtime. 06/02/13   Angelica Chessman, MD  amoxicillin-clavulanate (AUGMENTIN) 875-125 MG per tablet Take 1 tablet by mouth 2 (two) times daily. 12/28/13   Amber Fidel Levy, MD  cyclobenzaprine (FLEXERIL) 10 MG tablet Take 1 tablet (10 mg total) by mouth 2 (two) times daily as needed for muscle spasms. 06/02/13   Angelica Chessman, MD  guaiFENesin-codeine 100-10 MG/5ML syrup Take 5 mLs by mouth every 4 (four) hours as needed for cough. 12/28/13   Amber Fidel Levy, MD  levothyroxine (SYNTHROID, LEVOTHROID) 50 MCG tablet Take 1 tablet (50 mcg total) by mouth daily before breakfast. 06/15/13   Angelica Chessman, MD  Multiple Vitamins-Minerals (WOMENS MULTI VITAMIN & MINERAL PO) Take by mouth.    Historical Provider, MD  traMADol (ULTRAM) 50 MG tablet Take 2 tablets (100 mg total) by mouth every 6 (six) hours as needed for pain. 06/02/13   Angelica Chessman, MD   BP 130/92  Pulse 98  Temp(Src) 97.2 F (36.2 C) (Oral)  Resp 18  SpO2 97% Physical Exam  Nursing note and vitals reviewed. Constitutional: She is oriented to person, place, and time. She appears well-developed and well-nourished.  HENT:  Head: Normocephalic.  Right Ear: External ear normal.  Left Ear: External ear normal.  Mouth/Throat: Uvula is midline, oropharynx is clear and moist and mucous membranes are normal. Abnormal dentition.    Eyes: Conjunctivae are normal. Pupils are equal, round, and reactive to light.  Neck: Normal range of motion. Neck supple.  Lymphadenopathy:    She has no cervical adenopathy.  Neurological: She is alert and oriented to person, place, and time.  Skin: Skin is warm and dry.    ED Course  Procedures (including critical care time) Labs Review Labs Reviewed - No data to display  Imaging Review No results found.   MDM   1. Pain, dental      Discussed with dr Mariel Sleet, plans as suggested.    Billy Fischer, MD 01/27/14 [phone]

## 2014-03-29 ENCOUNTER — Ambulatory Visit: Payer: No Typology Code available for payment source | Attending: Family Medicine | Admitting: Physical Therapy

## 2014-03-29 DIAGNOSIS — M545 Low back pain, unspecified: Secondary | ICD-10-CM | POA: Insufficient documentation

## 2014-03-29 DIAGNOSIS — IMO0001 Reserved for inherently not codable concepts without codable children: Secondary | ICD-10-CM | POA: Insufficient documentation

## 2014-03-29 DIAGNOSIS — M25559 Pain in unspecified hip: Secondary | ICD-10-CM | POA: Insufficient documentation

## 2014-04-05 ENCOUNTER — Ambulatory Visit: Payer: No Typology Code available for payment source | Attending: Family Medicine | Admitting: Rehabilitation

## 2014-04-05 DIAGNOSIS — IMO0001 Reserved for inherently not codable concepts without codable children: Secondary | ICD-10-CM | POA: Insufficient documentation

## 2014-04-05 DIAGNOSIS — M25559 Pain in unspecified hip: Secondary | ICD-10-CM | POA: Insufficient documentation

## 2014-04-05 DIAGNOSIS — M545 Low back pain, unspecified: Secondary | ICD-10-CM | POA: Insufficient documentation

## 2014-04-13 ENCOUNTER — Encounter: Payer: No Typology Code available for payment source | Admitting: Rehabilitation

## 2014-04-18 ENCOUNTER — Ambulatory Visit: Payer: No Typology Code available for payment source | Admitting: Rehabilitation

## 2014-05-04 ENCOUNTER — Ambulatory Visit
Admission: RE | Admit: 2014-05-04 | Discharge: 2014-05-04 | Disposition: A | Discharge status: Home / Self Care | Payer: No Typology Code available for payment source | Source: Ambulatory Visit | Attending: Family Medicine | Admitting: Family Medicine

## 2014-05-04 ENCOUNTER — Other Ambulatory Visit: Payer: Self-pay | Admitting: Family Medicine

## 2014-05-04 DIAGNOSIS — R319 Hematuria, unspecified: Secondary | ICD-10-CM

## 2014-05-04 DIAGNOSIS — R110 Nausea: Secondary | ICD-10-CM

## 2014-05-04 DIAGNOSIS — R52 Pain, unspecified: Secondary | ICD-10-CM

## 2014-09-10 IMAGING — CR DG SHOULDER 2+V*L*
4 series · 4 of 4 positions shown · non-contrast
Comparison: None.

CLINICAL DATA: Severe left shoulder and scapula pain.

LEFT SHOULDER - 2+ VIEW

[view not recorded (1 of 4)]
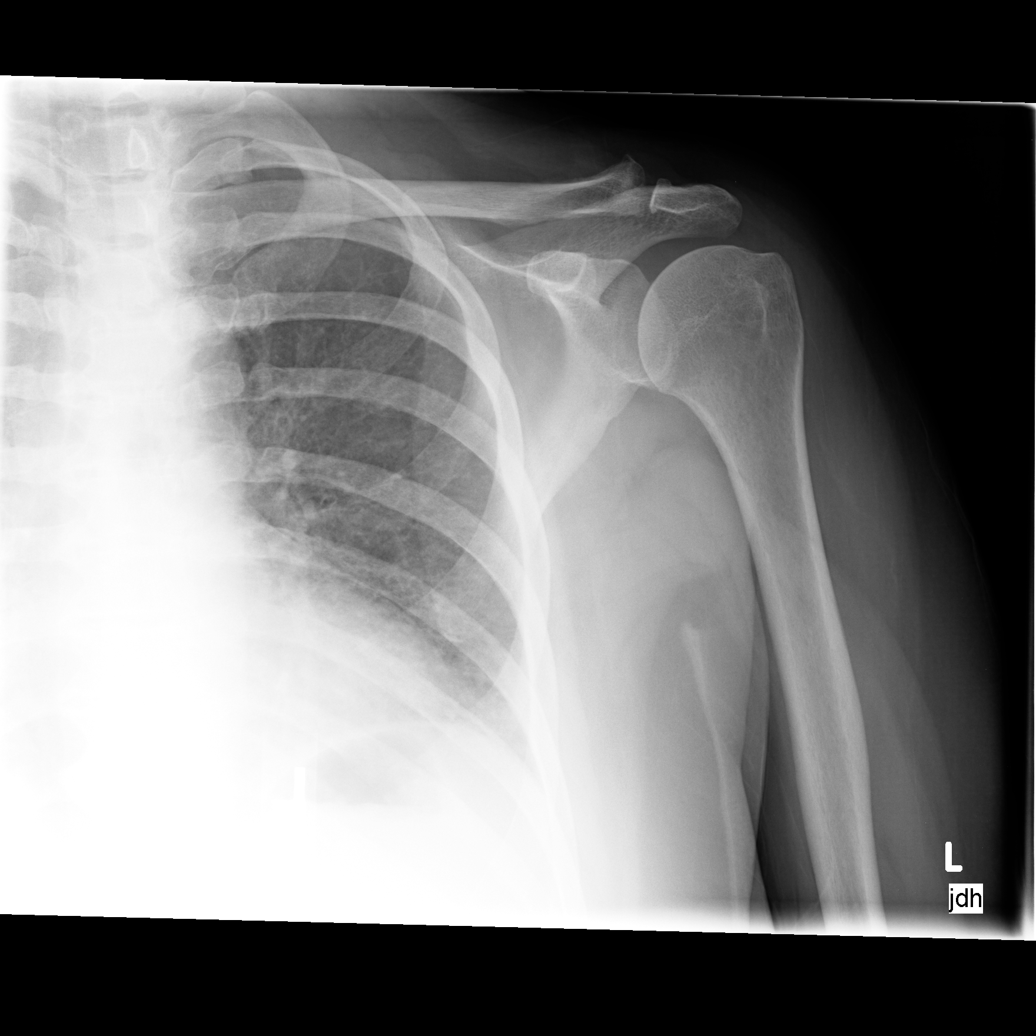

[view not recorded (2 of 4)]
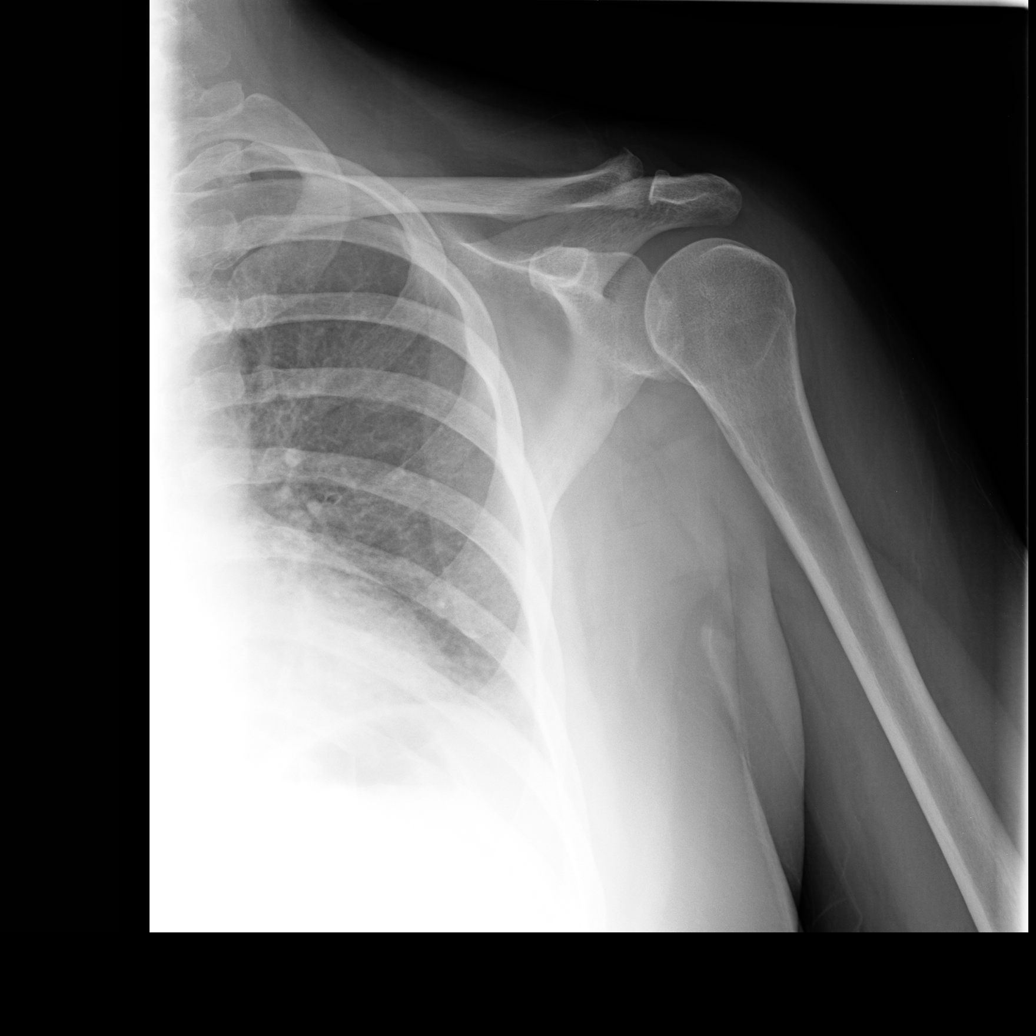

[view not recorded (3 of 4)]
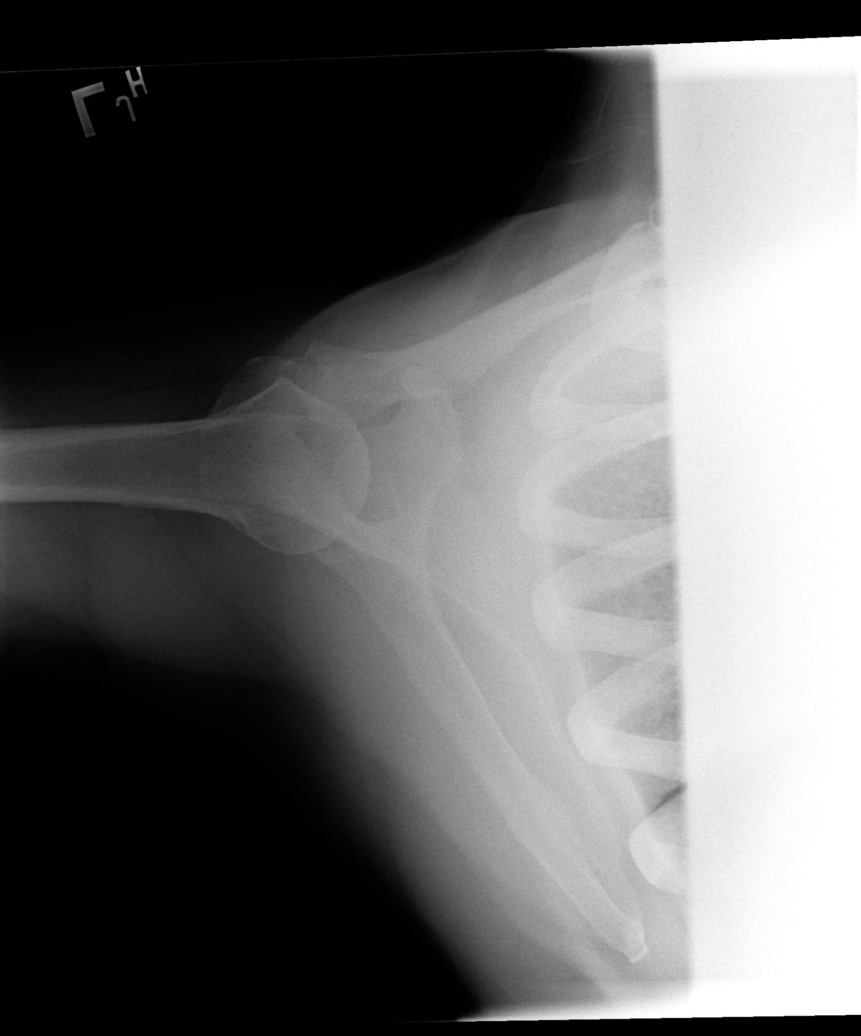

[view not recorded (4 of 4)]
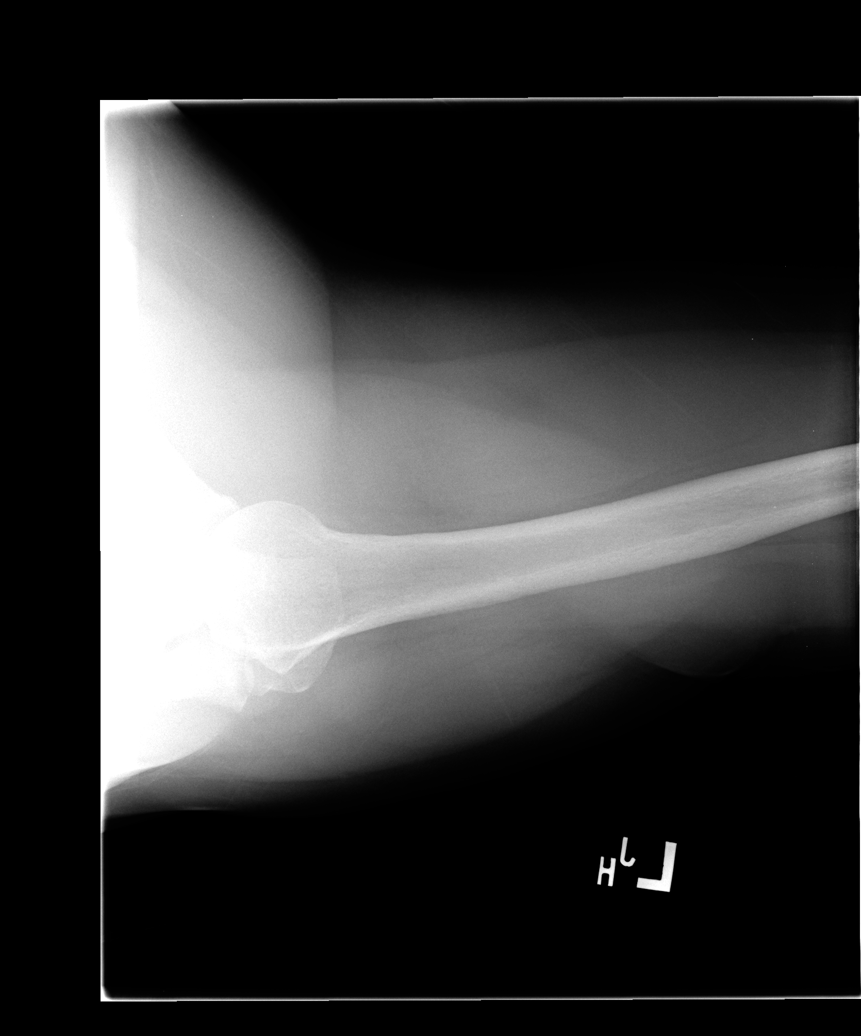

[4 of 4 positions shown; findings below may reference images not displayed]

FINDINGS: The glenohumeral joint is normal.  No soft tissue
calcifications.  Moderate arthritis at the acromioclavicular joint.
The scapula appears normal.
IMPRESSION: Moderate arthritis of the acromioclavicular joint.  Otherwise,
normal exam.

## 2015-02-06 ENCOUNTER — Emergency Department (INDEPENDENT_AMBULATORY_CARE_PROVIDER_SITE_OTHER)
Admission: EM | Admit: 2015-02-06 | Discharge: 2015-02-06 | Disposition: A | Discharge status: Home / Self Care | Payer: Self-pay | Source: Home / Self Care | Attending: Family Medicine | Admitting: Family Medicine

## 2015-02-06 ENCOUNTER — Encounter (HOSPITAL_COMMUNITY): Payer: Self-pay | Admitting: Emergency Medicine

## 2015-02-06 DIAGNOSIS — N390 Urinary tract infection, site not specified: Secondary | ICD-10-CM

## 2015-02-06 LAB — POCT PREGNANCY, URINE: Preg Test, Ur: NEGATIVE

## 2015-02-06 LAB — POCT URINALYSIS DIP (DEVICE)
Bilirubin Urine: NEGATIVE
Glucose, UA: NEGATIVE mg/dL
Ketones, ur: NEGATIVE mg/dL
NITRITE: NEGATIVE
PH: 6 (ref 5.0–8.0)
Protein, ur: NEGATIVE mg/dL
Specific Gravity, Urine: <=1.005 (ref 1.005–1.030)
UROBILINOGEN UA: 0.2 mg/dL (ref 0.0–1.0)

## 2015-02-06 MED ORDER — CEPHALEXIN 500 MG PO CAPS: 500.0000 mg | ORAL_CAPSULE | Freq: Three times a day (TID) | ORAL | Status: DC

## 2015-02-06 NOTE — ED Provider Notes (Signed)
CSN: [id number]     Arrival date & time 02/06/15  1251 History   First MD Initiated Contact with Patient 02/06/15 1536     Chief Complaint  Patient presents with  . Urinary Tract Infection   (Consider location/radiation/quality/duration/timing/severity/associated sxs/prior Treatment) HPI      54 year old female presents complaining of possible urinary tract infection. She has lower Donald discomfort, urinary frequency, and gross hematuria. This started one week ago and has gotten gradually worse. She also has low back pain. Denies flank pain, fever, chills, NVD.  Past Medical History  Diagnosis Date  . Depression   . Thyroid disease    Past Surgical History  Procedure Laterality Date  . Cholecystectomy    . Appendectomy    . Cesarean section      twice '84 and '89  . Nasal sinus surgery      twice -enlarged sinuses and cleared scar tissue  . Hernia repair  2004 and 2005    twice  same site after cholecystectomy  . Finger surgery     Family History  Problem Relation Age of Onset  . Heart disease Father    History  Substance Use Topics  . Smoking status: Current Every Day Smoker -- 1.00 packs/day    Types: Cigarettes  . Smokeless tobacco: Not on file  . Alcohol Use: No   OB History    No data available     Review of Systems  Gastrointestinal: Positive for abdominal pain.  Genitourinary: Positive for dysuria, urgency, frequency and hematuria.  Musculoskeletal: Positive for back pain.  All other systems reviewed and are negative.   Allergies  Review of patient's allergies indicates no known allergies.  Home Medications   Prior to Admission medications   Medication Sig Start Date End Date Taking? Authorizing Provider  acetaminophen (TYLENOL) 325 MG tablet Take 650 mg by mouth every 6 (six) hours as needed.    Historical Provider, MD  amitriptyline (ELAVIL) 50 MG tablet Take 1 tablet (50 mg total) by mouth at bedtime. 06/02/13   Tresa Garter, MD  amoxicillin  (AMOXIL) 500 MG capsule Take 1 capsule (500 mg total) by mouth 3 (three) times daily. 01/27/14   Billy Fischer, MD  amoxicillin-clavulanate (AUGMENTIN) 875-125 MG per tablet Take 1 tablet by mouth 2 (two) times daily. 12/28/13   Amber Fidel Levy, MD  cephALEXin (KEFLEX) 500 MG capsule Take 1 capsule (500 mg total) by mouth 3 (three) times daily. 02/06/15   Liam Graham, PA-C  cyclobenzaprine (FLEXERIL) 10 MG tablet Take 1 tablet (10 mg total) by mouth 2 (two) times daily as needed for muscle spasms. 06/02/13   Tresa Garter, MD  diclofenac (CATAFLAM) 50 MG tablet Take 1 tablet (50 mg total) by mouth 3 (three) times daily. For tooth pain 01/27/14   Billy Fischer, MD  guaiFENesin-codeine 100-10 MG/5ML syrup Take 5 mLs by mouth every 4 (four) hours as needed for cough. 12/28/13   Amber Fidel Levy, MD  ibuprofen (ADVIL,MOTRIN) 100 MG/5ML suspension Take 200 mg by mouth every 4 (four) hours as needed.    Historical Provider, MD  levothyroxine (SYNTHROID, LEVOTHROID) 50 MCG tablet Take 1 tablet (50 mcg total) by mouth daily before breakfast. 06/15/13   Tresa Garter, MD  Multiple Vitamins-Minerals (WOMENS MULTI VITAMIN & MINERAL PO) Take by mouth.    Historical Provider, MD  traMADol (ULTRAM) 50 MG tablet Take 2 tablets (100 mg total) by mouth every 6 (six) hours as needed for pain.  06/02/13   Tresa Garter, MD   BP 121/84 mmHg  Pulse 75  Temp(Src) 98.3 F (36.8 C) (Oral)  Resp 16  SpO2 97% Physical Exam  Constitutional: She is oriented to person, place, and time. Vital signs are normal. She appears well-developed and well-nourished. No distress.  HENT:  Head: Normocephalic and atraumatic.  Cardiovascular: Normal rate, regular rhythm and normal heart sounds.   Pulmonary/Chest: Effort normal. No respiratory distress.  Abdominal: Soft. Bowel sounds are normal. She exhibits no distension and no mass. There is no tenderness. There is no rebound, no guarding and no CVA tenderness.   Neurological: She is alert and oriented to person, place, and time. She has normal strength. Coordination normal.  Skin: Skin is warm and dry. No rash noted. She is not diaphoretic.  Psychiatric: She has a normal mood and affect. Judgment normal.  Nursing note and vitals reviewed.   ED Course  Procedures (including critical care time) Labs Review Labs Reviewed  POCT URINALYSIS DIP (DEVICE) - Abnormal; Notable for the following:    Hgb urine dipstick LARGE (*)    Leukocytes, UA SMALL (*)    All other components within normal limits  URINE CULTURE  POCT PREGNANCY, URINE    Imaging Review No results found.   MDM   1. UTI (lower urinary tract infection)    UTI, treat with Keflex. Urine culture sent. Follow-up when necessary.   Meds ordered this encounter  Medications  . cephALEXin (KEFLEX) 500 MG capsule    Sig: Take 1 capsule (500 mg total) by mouth 3 (three) times daily.    Dispense:  15 capsule    Refill:  Milton Ayaan Ringle, PA-C 02/06/15 1622

## 2015-02-06 NOTE — Discharge Instructions (Signed)

## 2015-02-06 NOTE — ED Notes (Signed)
Patient c/o urinary frequency and hematuria onset 1 week ago. Patient reports yesterday was the first episode of hematuria and dysuria. Patient is in NAD.

## 2015-02-08 LAB — URINE CULTURE

## 2015-02-14 NOTE — ED Notes (Signed)
Final report of UA culture shows sensitivity to Rx written. No further action required

## 2015-03-08 ENCOUNTER — Encounter (HOSPITAL_COMMUNITY): Payer: Self-pay | Admitting: Emergency Medicine

## 2015-03-08 ENCOUNTER — Emergency Department (HOSPITAL_COMMUNITY): Payer: Self-pay

## 2015-03-08 ENCOUNTER — Emergency Department (HOSPITAL_COMMUNITY)
Admission: EM | Admit: 2015-03-08 | Discharge: 2015-03-08 | Disposition: A | Discharge status: Home / Self Care | Payer: Self-pay | Attending: Emergency Medicine | Admitting: Emergency Medicine

## 2015-03-08 DIAGNOSIS — F329 Major depressive disorder, single episode, unspecified: Secondary | ICD-10-CM | POA: Insufficient documentation

## 2015-03-08 DIAGNOSIS — Z720 Tobacco use: Secondary | ICD-10-CM | POA: Insufficient documentation

## 2015-03-08 DIAGNOSIS — E079 Disorder of thyroid, unspecified: Secondary | ICD-10-CM | POA: Insufficient documentation

## 2015-03-08 DIAGNOSIS — Z79899 Other long term (current) drug therapy: Secondary | ICD-10-CM | POA: Insufficient documentation

## 2015-03-08 DIAGNOSIS — S20212A Contusion of left front wall of thorax, initial encounter: Secondary | ICD-10-CM | POA: Insufficient documentation

## 2015-03-08 DIAGNOSIS — Y9389 Activity, other specified: Secondary | ICD-10-CM | POA: Insufficient documentation

## 2015-03-08 DIAGNOSIS — Y92410 Unspecified street and highway as the place of occurrence of the external cause: Secondary | ICD-10-CM | POA: Insufficient documentation

## 2015-03-08 DIAGNOSIS — R0781 Pleurodynia: Secondary | ICD-10-CM

## 2015-03-08 DIAGNOSIS — Y998 Other external cause status: Secondary | ICD-10-CM | POA: Insufficient documentation

## 2015-03-08 DIAGNOSIS — Z792 Long term (current) use of antibiotics: Secondary | ICD-10-CM | POA: Insufficient documentation

## 2015-03-08 DIAGNOSIS — S40022A Contusion of left upper arm, initial encounter: Secondary | ICD-10-CM | POA: Insufficient documentation

## 2015-03-08 LAB — COMPREHENSIVE METABOLIC PANEL
ALK PHOS: 59 U/L (ref 38–126)
ALT: 18 U/L (ref 14–54)
AST: 22 U/L (ref 15–41)
Albumin: 3.3 g/dL — ABNORMAL LOW (ref 3.5–5.0)
Anion gap: 9 (ref 5–15)
BILIRUBIN TOTAL: 0.5 mg/dL (ref 0.3–1.2)
BUN: 14 mg/dL (ref 6–20)
CALCIUM: 8.5 mg/dL — AB (ref 8.9–10.3)
CO2: 23 mmol/L (ref 22–32)
Chloride: 108 mmol/L (ref 101–111)
Creatinine, Ser: 1.11 mg/dL — ABNORMAL HIGH (ref 0.44–1.00)
GFR calc Af Amer: >60 mL/min (ref >60)
GFR calc non Af Amer: 55 mL/min — ABNORMAL LOW (ref >60)
Glucose, Bld: 92 mg/dL (ref 65–99)
POTASSIUM: 3.7 mmol/L (ref 3.5–5.1)
Sodium: 140 mmol/L (ref 135–145)
TOTAL PROTEIN: 6.4 g/dL — AB (ref 6.5–8.1)

## 2015-03-08 LAB — CBC WITH DIFFERENTIAL/PLATELET
BASOS ABS: 0 10*3/uL (ref 0.0–0.1)
Basophils Relative: 0 % (ref 0–1)
EOS ABS: 0.1 10*3/uL (ref 0.0–0.7)
Eosinophils Relative: 1 % (ref 0–5)
HCT: 37.1 % (ref 36.0–46.0)
Hemoglobin: 12 g/dL (ref 12.0–15.0)
Lymphocytes Relative: 25 % (ref 12–46)
Lymphs Abs: 2.4 10*3/uL (ref 0.7–4.0)
MCH: 27.6 pg (ref 26.0–34.0)
MCHC: 32.3 g/dL (ref 30.0–36.0)
MCV: 85.5 fL (ref 78.0–100.0)
Monocytes Absolute: 0.6 10*3/uL (ref 0.1–1.0)
Monocytes Relative: 6 % (ref 3–12)
NEUTROS ABS: 6.4 10*3/uL (ref 1.7–7.7)
Neutrophils Relative %: 68 % (ref 43–77)
PLATELETS: 203 10*3/uL (ref 150–400)
RBC: 4.34 MIL/uL (ref 3.87–5.11)
RDW: 14.4 % (ref 11.5–15.5)
WBC: 9.6 10*3/uL (ref 4.0–10.5)

## 2015-03-08 LAB — URINALYSIS, ROUTINE W REFLEX MICROSCOPIC
BILIRUBIN URINE: NEGATIVE
Glucose, UA: NEGATIVE mg/dL
Ketones, ur: NEGATIVE mg/dL
LEUKOCYTES UA: NEGATIVE
Nitrite: NEGATIVE
PROTEIN: NEGATIVE mg/dL
SPECIFIC GRAVITY, URINE: 1.013 (ref 1.005–1.030)
Urobilinogen, UA: 0.2 mg/dL (ref 0.0–1.0)
pH: 5.5 (ref 5.0–8.0)

## 2015-03-08 LAB — URINE MICROSCOPIC-ADD ON

## 2015-03-08 LAB — TROPONIN I

## 2015-03-08 MED ORDER — TRAMADOL HCL 50 MG PO TABS: 100.0000 mg | ORAL_TABLET | Freq: Four times a day (QID) | ORAL | Status: DC | PRN

## 2015-03-08 MED ORDER — HYDROCODONE-ACETAMINOPHEN 5-325 MG PO TABS
1.0000 | ORAL_TABLET | Freq: Once | ORAL | Status: AC
  Administered 2015-03-08: 1 via ORAL
  Filled 2015-03-08: qty 1

## 2015-03-08 MED ORDER — TRAMADOL HCL 50 MG PO TABS
100.0000 mg | ORAL_TABLET | Freq: Once | ORAL | Status: AC
  Administered 2015-03-08: 100 mg via ORAL
  Filled 2015-03-08: qty 2

## 2015-03-08 MED ORDER — ACETAMINOPHEN 500 MG PO TABS: 1000.0000 mg | ORAL_TABLET | Freq: Four times a day (QID) | ORAL | Status: DC | PRN
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
## 2015-03-08 NOTE — ED Notes (Signed)
Pt hooked back up to 5 lead, BP cuff and pulse ox
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
## 2015-03-08 NOTE — ED Notes (Signed)
Pt ambulatory to bathroom without any problems 
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
## 2015-09-07 ENCOUNTER — Encounter (HOSPITAL_COMMUNITY): Payer: Self-pay

## 2015-09-07 ENCOUNTER — Emergency Department (HOSPITAL_COMMUNITY): Payer: Self-pay

## 2015-09-07 ENCOUNTER — Emergency Department (HOSPITAL_COMMUNITY)
Admission: EM | Admit: 2015-09-07 | Discharge: 2015-09-07 | Disposition: A | Discharge status: Home / Self Care | Payer: Self-pay | Attending: Emergency Medicine | Admitting: Emergency Medicine

## 2015-09-07 DIAGNOSIS — Z791 Long term (current) use of non-steroidal anti-inflammatories (NSAID): Secondary | ICD-10-CM | POA: Insufficient documentation

## 2015-09-07 DIAGNOSIS — R0789 Other chest pain: Secondary | ICD-10-CM | POA: Insufficient documentation

## 2015-09-07 DIAGNOSIS — Z79899 Other long term (current) drug therapy: Secondary | ICD-10-CM | POA: Insufficient documentation

## 2015-09-07 DIAGNOSIS — E079 Disorder of thyroid, unspecified: Secondary | ICD-10-CM | POA: Insufficient documentation

## 2015-09-07 DIAGNOSIS — F17210 Nicotine dependence, cigarettes, uncomplicated: Secondary | ICD-10-CM | POA: Insufficient documentation

## 2015-09-07 DIAGNOSIS — J069 Acute upper respiratory infection, unspecified: Secondary | ICD-10-CM | POA: Insufficient documentation

## 2015-09-07 DIAGNOSIS — Z792 Long term (current) use of antibiotics: Secondary | ICD-10-CM | POA: Insufficient documentation

## 2015-09-07 DIAGNOSIS — G894 Chronic pain syndrome: Secondary | ICD-10-CM | POA: Insufficient documentation

## 2015-09-07 DIAGNOSIS — M797 Fibromyalgia: Secondary | ICD-10-CM | POA: Insufficient documentation

## 2015-09-07 DIAGNOSIS — F329 Major depressive disorder, single episode, unspecified: Secondary | ICD-10-CM | POA: Insufficient documentation

## 2015-09-07 HISTORY — DX: Fibromyalgia: M79.7

## 2015-09-07 HISTORY — DX: Chronic pain syndrome: G89.4

## 2015-09-07 HISTORY — DX: Other fatigue: R53.83

## 2015-09-07 HISTORY — DX: Reserved for inherently not codable concepts without codable children: IMO0001

## 2015-09-07 LAB — I-STAT CHEM 8, ED
BUN: 13 mg/dL (ref 6–20)
CALCIUM ION: 1.11 mmol/L — AB (ref 1.12–1.23)
CHLORIDE: 107 mmol/L (ref 101–111)
Creatinine, Ser: 0.9 mg/dL (ref 0.44–1.00)
GLUCOSE: 121 mg/dL — AB (ref 65–99)
HCT: 32 % — ABNORMAL LOW (ref 36.0–46.0)
Hemoglobin: 10.9 g/dL — ABNORMAL LOW (ref 12.0–15.0)
POTASSIUM: 3.5 mmol/L (ref 3.5–5.1)
Sodium: 142 mmol/L (ref 135–145)
TCO2: 22 mmol/L (ref 0–100)

## 2015-09-07 LAB — I-STAT TROPONIN, ED: TROPONIN I, POC: 0.01 ng/mL (ref 0.00–0.08)

## 2015-09-07 MED ORDER — NAPROXEN 250 MG PO TABS: 250.0000 mg | ORAL_TABLET | Freq: Two times a day (BID) | ORAL | Status: DC | PRN

## 2015-09-07 MED ORDER — ALBUTEROL SULFATE (2.5 MG/3ML) 0.083% IN NEBU
2.5000 mg | INHALATION_SOLUTION | Freq: Once | RESPIRATORY_TRACT | Status: AC
  Administered 2015-09-07: 2.5 mg via RESPIRATORY_TRACT
  Filled 2015-09-07: qty 3

## 2015-09-07 MED ORDER — ALBUTEROL SULFATE HFA 108 (90 BASE) MCG/ACT IN AERS
2.0000 | INHALATION_SPRAY | RESPIRATORY_TRACT | Status: AC
  Administered 2015-09-07: 2 via RESPIRATORY_TRACT
  Filled 2015-09-07: qty 6.7

## 2015-09-07 MED ORDER — HYDROCODONE-ACETAMINOPHEN 5-325 MG PO TABS: ORAL_TABLET | ORAL | Status: DC

## 2015-09-07 MED ORDER — IPRATROPIUM-ALBUTEROL 0.5-2.5 (3) MG/3ML IN SOLN
3.0000 mL | Freq: Once | RESPIRATORY_TRACT | Status: AC
  Administered 2015-09-07: 3 mL via RESPIRATORY_TRACT
  Filled 2015-09-07: qty 3

## 2015-09-07 MED ORDER — HYDROCODONE-ACETAMINOPHEN 5-325 MG PO TABS
1.0000 | ORAL_TABLET | Freq: Once | ORAL | Status: AC
  Administered 2015-09-07: 1 via ORAL
  Filled 2015-09-07: qty 1

## 2015-09-07 MED ORDER — BENZONATATE 100 MG PO CAPS: 100.0000 mg | ORAL_CAPSULE | Freq: Three times a day (TID) | ORAL | Status: DC | PRN

## 2015-09-07 NOTE — ED Notes (Signed)
Patient verbalizes understanding of discharge instructions, prescription medications, home care and follow up care. Patient ambulatory out of department at this time with family member. 

## 2015-09-07 NOTE — ED Provider Notes (Signed)
CSN: [id number]     Arrival date & time 09/07/15  1856 History   First MD Initiated Contact with Patient 09/07/15 1910     Chief Complaint  Patient presents with  . Shortness of Breath      HPI Pt was seen at 1915.  Per pt, c/o gradual onset and persistence of constant runny/stuffy nose, sinus congestion, and cough for the past 2 weeks, worse over the past 4 days. Has been associated with constant left sided chest wall "pain," which worsens with palpation of the area, body position changes, and coughing. Pt continues to smoke cigarettes. Others in household with similar symptoms. Denies fevers, no rash, no CP/SOB, no N/V/D, no abd pain.     Past Medical History  Diagnosis Date  . Depression   . Thyroid disease   . Fibromyalgia   . Chronic pain syndrome   . Fatigue   . Normal cardiac stress test 2002   Past Surgical History  Procedure Laterality Date  . Cholecystectomy    . Appendectomy    . Cesarean section      twice '84 and '89  . Nasal sinus surgery      twice -enlarged sinuses and cleared scar tissue  . Hernia repair  2004 and 2005    twice  same site after cholecystectomy  . Finger surgery     Family History  Problem Relation Age of Onset  . Heart disease Father    Social History  Substance Use Topics  . Smoking status: Current Every Day Smoker -- 1.00 packs/day    Types: Cigarettes  . Smokeless tobacco: None  . Alcohol Use: No    Review of Systems ROS: Statement: All systems negative except as marked or noted in the HPI; Constitutional: Negative for fever and chills. +generalized fatigue. ; ; Eyes: Negative for eye pain, redness and discharge. ; ; ENMT: Negative for ear pain, hoarseness, sore throat. +nasal congestion, sinus pressure and rhinorrhea. ; ; Cardiovascular: Negative for chest pain, palpitations, diaphoresis, dyspnea and peripheral edema. ; ; Respiratory: +cough. Negative for wheezing and stridor. ; ; Gastrointestinal: Negative for nausea, vomiting,  diarrhea, abdominal pain, blood in stool, hematemesis, jaundice and rectal bleeding. . ; ; Genitourinary: Negative for dysuria, flank pain and hematuria. ; ; Musculoskeletal: +chest wall pain. Negative for back pain and neck pain. Negative for swelling and trauma.; ; Skin: Negative for pruritus, rash, abrasions, blisters, bruising and skin lesion.; ; Neuro: Negative for headache, lightheadedness and neck stiffness. Negative for weakness, altered level of consciousness , altered mental status, extremity weakness, paresthesias, involuntary movement, seizure and syncope.      Allergies  Review of patient's allergies indicates no known allergies.  Home Medications   Prior to Admission medications   Medication Sig Start Date End Date Taking? Authorizing Provider  acetaminophen (TYLENOL) 325 MG tablet Take 650 mg by mouth every 6 (six) hours as needed.    Historical Provider, MD  acetaminophen (TYLENOL) 500 MG tablet Take 2 tablets (1,000 mg total) by mouth every 6 (six) hours as needed. 03/08/15   Charlesetta Shanks, MD  amitriptyline (ELAVIL) 50 MG tablet Take 1 tablet (50 mg total) by mouth at bedtime. 06/02/13   Tresa Garter, MD  amoxicillin (AMOXIL) 500 MG capsule Take 1 capsule (500 mg total) by mouth 3 (three) times daily. 01/27/14   Billy Fischer, MD  amoxicillin-clavulanate (AUGMENTIN) 875-125 MG per tablet Take 1 tablet by mouth 2 (two) times daily. 12/28/13   Amber M Hairford,  MD  cephALEXin (KEFLEX) 500 MG capsule Take 1 capsule (500 mg total) by mouth 3 (three) times daily. 02/06/15   Liam Graham, PA-C  cyclobenzaprine (FLEXERIL) 10 MG tablet Take 1 tablet (10 mg total) by mouth 2 (two) times daily as needed for muscle spasms. 06/02/13   Tresa Garter, MD  diclofenac (CATAFLAM) 50 MG tablet Take 1 tablet (50 mg total) by mouth 3 (three) times daily. For tooth pain 01/27/14   Billy Fischer, MD  guaiFENesin-codeine 100-10 MG/5ML syrup Take 5 mLs by mouth every 4 (four) hours as needed for  cough. 12/28/13   Amber Fidel Levy, MD  ibuprofen (ADVIL,MOTRIN) 100 MG/5ML suspension Take 200 mg by mouth every 4 (four) hours as needed.    Historical Provider, MD  levothyroxine (SYNTHROID, LEVOTHROID) 50 MCG tablet Take 1 tablet (50 mcg total) by mouth daily before breakfast. 06/15/13   Tresa Garter, MD  Multiple Vitamins-Minerals (WOMENS MULTI VITAMIN & MINERAL PO) Take by mouth.    Historical Provider, MD  traMADol (ULTRAM) 50 MG tablet Take 2 tablets (100 mg total) by mouth every 6 (six) hours as needed for pain. 06/02/13   Tresa Garter, MD  traMADol (ULTRAM) 50 MG tablet Take 2 tablets (100 mg total) by mouth every 6 (six) hours as needed. 03/08/15   Charlesetta Shanks, MD   BP 139/71 mmHg  Pulse 121  Temp(Src) 97.5 F (36.4 C) (Oral)  Resp 27  Ht 5\' 3"  (1.6 m)  Wt 160 lb (72.576 kg)  BMI 28.35 kg/m2  SpO2 100%   Filed Vitals:   09/07/15 1904 09/07/15 1938 09/07/15 1945 09/07/15 2000  BP: 141/95  120/62 139/71  Pulse: 90  93 121  Temp: 97.5 F (36.4 C)     TempSrc: Oral     Resp: 13  20 27   Height: 5\' 3"  (1.6 m)     Weight: 160 lb (72.576 kg)     SpO2: 99% 98% 100% 100%     Physical Exam  1920: Physical examination:  Nursing notes reviewed; Vital signs and O2 SAT reviewed;  Constitutional: Well developed, Well nourished, Well hydrated, In no acute distress; Head:  Normocephalic, atraumatic; Eyes: EOMI, PERRL, No scleral icterus; ENMT: TM's clear bilat. +edemetous nasal turbinates bilat with clear rhinorrhea. Mouth and pharynx without lesions. No tonsillar exudates. No intra-oral edema. No submandibular or sublingual edema. No hoarse voice, no drooling, no stridor. No trismus. Mouth and pharynx normal, Mucous membranes moist; Neck: Supple, Full range of motion, No lymphadenopathy; Cardiovascular: Regular rate and rhythm, No gallop; Respiratory: Breath sounds diminished & equal bilaterally, No wheezes. +frequent coughing during exam. Speaking full sentences with ease,  Normal respiratory effort/excursion; Chest: +left lateral chest wall tender to palp. No rash, no deformity, no soft tissue crepitus. Movement normal; Abdomen: Soft, Nontender, Nondistended, Normal bowel sounds; Genitourinary: No CVA tenderness; Extremities: Pulses normal, No tenderness, No edema, No calf edema or asymmetry.; Neuro: AA&Ox3, Major CN grossly intact.  Speech clear. No gross focal motor or sensory deficits in extremities.; Skin: Color normal, Warm, Dry.   ED Course  Procedures (including critical care time) Labs Review   Imaging Review  I have personally reviewed and evaluated these images and lab results as part of my medical decision-making.   EKG Interpretation   Date/Time:  Friday September 07 2015 19:22:40 EST Ventricular Rate:  82 PR Interval:  137 QRS Duration: 88 QT Interval:  379 QTC Calculation: 443 R Axis:   66 Text Interpretation:  Sinus  rhythm Baseline wander When compared with ECG  of 03/08/2015 No significant change was found Confirmed by [facility]  MD,  Nunzio Cory [phone]) on 09/07/2015 7:55:41 PM      MDM  MDM Reviewed: previous chart, nursing note and vitals Reviewed previous: labs and ECG Interpretation: labs, ECG and x-ray     Results for orders placed or performed during the hospital encounter of 09/07/15  I-stat Chem 8, ED  Result Value Ref Range   Sodium 142 135 - 145 mmol/L   Potassium 3.5 3.5 - 5.1 mmol/L   Chloride 107 101 - 111 mmol/L   BUN 13 6 - 20 mg/dL   Creatinine, Ser 0.90 0.44 - 1.00 mg/dL   Glucose, Bld 121 (H) 65 - 99 mg/dL   Calcium, Ion 1.11 (L) 1.12 - 1.23 mmol/L   TCO2 22 0 - 100 mmol/L   Hemoglobin 10.9 (L) 12.0 - 15.0 g/dL   HCT 32.0 (L) 36.0 - 46.0 %  I-stat troponin, ED  Result Value Ref Range   Troponin i, poc 0.01 0.00 - 0.08 ng/mL   Comment 3           Dg Chest 2 View 09/07/2015  CLINICAL DATA:  54 year old female with nonproductive cough and shortness of breath left-sided chest pain EXAM: CHEST  2 VIEW  COMPARISON:  Chest radiograph dated 03/08/2015 FINDINGS: The heart size and mediastinal contours are within normal limits. Both lungs are clear. The visualized skeletal structures are unremarkable. IMPRESSION: No active cardiopulmonary disease. Electronically Signed   By: Anner Crete M.D.   On: 09/07/2015 20:02    2125:  Doubt PE as cause for symptoms with low risk [place].  Doubt ACS as cause for symptoms with normal troponin and unchanged EKG from previous after 2 weeks of constant symptoms. Pt states she "feels better" after neb.  NAD, lungs CTA bilat, no wheezing, resps easy, speaking full sentences, Sats 97% R/A on my re-exam.  Pt states she wants to go home now. Tx symptomatically at this time. Dx and testing d/w pt and family.  Questions answered.  Verb understanding, agreeable to d/c home with outpt f/u.    Francine Graven, DO 09/10/15 2228

## 2015-09-07 NOTE — Discharge Instructions (Signed)
°Emergency Department Resource Guide °1) Find a Doctor and Pay Out of Pocket °Although you won't have to find out who is covered by your insurance plan, it is a good idea to ask around and get recommendations. You will then need to call the office and see if the doctor you have chosen will accept you as a new patient and what types of options they offer for patients who are self-pay. Some doctors offer discounts or will set up payment plans for their patients who do not have insurance, but you will need to ask so you aren't surprised when you get to your appointment. ° °2) Contact Your Local Health Department °Not all health departments have doctors that can see patients for sick visits, but many do, so it is worth a call to see if yours does. If you don't know where your local health department is, you can check in your phone book. The CDC also has a tool to help you locate your state's health department, and many state websites also have listings of all of their local health departments. ° °3) Find a Walk-in Clinic °If your illness is not likely to be very severe or complicated, you may want to try a walk in clinic. These are popping up all over the country in pharmacies, drugstores, and shopping centers. They're usually staffed by nurse practitioners or physician assistants that have been trained to treat common illnesses and complaints. They're usually fairly quick and inexpensive. However, if you have serious medical issues or chronic medical problems, these are probably not your best option. ° °No Primary Care Doctor: °- Call [facility] at  832-8000 - they can help you locate a primary care doctor that  accepts your insurance, provides certain services, etc. °- Physician Referral Service- 1-800-533-3463 ° °Chronic Pain Problems: °Organization         Address  Phone   Notes  °[facility]  (336) 297-2271 Patients need to be referred by their primary care doctor.  ° °Medication  Assistance: °Organization         Address  Phone   Notes  °[place] [facility] [address] °[place], [place] [address] (336) 641-8030 --Must be a resident of [place] °-- Must have NO insurance coverage whatsoever (no Medicaid/ Medicare, etc.) °-- The pt. MUST have a primary care doctor that directs their care regularly and follows them in the community °  °[facility]  (866) 331-1348   °[employer]  (888) 892-1162   ° °Agencies that provide inexpensive medical care: °Organization         Address  Phone   Notes  °[facility]  (336) 832-8035   °[facility]    (336) 832-7272   °[facility] [address] °[place], [place] [address] (336) 832-4777   °[facility] [address], °[place] (336) 271-4999   °[facility]    (336) 373-0678   °[facility]    (336) 272-1050   °[facility] ° [address], [place] Phone:  (336) 832-4444, Fax:  (336) 832-4440 Hours of Operation:  9 am - 6 pm, M-F.  Also accepts Medicaid/Medicare and self-pay.  °[facility] ° [address], [place] Phone: (336) 832-3150, Fax: (336) 832-3151. Hours of Operation:  8:30 am - 5:30 pm, M-F.  Also accepts Medicaid and self-pay.  °[facility] [place] [address], [place] Phone: (336) 878-6027   °[facility] [address], [place], [place] (336)723-1848, Ext. 123 Mondays & Thursdays: 7-9 AM.  First 15 patients are seen on a first come, first serve basis. °  ° °Medicaid-accepting [place] Providers: ° °Organization         Address  Phone   Notes  °[facility] [address], [place] (336) 641-2100 Also accepts self-pay patients.  °[facility] [address], [place] ° (336) 856-9996   °[facility] [address], [place]  (336) 288-8857   °[facility] [address] [place] [address], [place] (336) 299-7000   °Veita Bland [address], [place]  ° (336) 373-1557 Only accepts [place] Access Medicaid patients after they have their name applied to their card.  ° °Self-Pay (no insurance) in [place]: ° °Organization         Address  Phone   Notes  °Sickle Cell Patients, [facility] [address], [place] (336) 832-1970   °[facility] [address], [place] (336) 832-4400   °[facility] ° [address], [place] (336) 992-4800   °[facility]/Dr. Osei-Bonsu ° [address] [place] [address], [place] or [address], [place] (336) 841-8500 Phone number for both [place] and [place] locations is the same.  °[facility] [address], [place] (336) 299-0000   °[facility] [address] [place] [address], [place] or [address] (336) 852-7530 °(336) 878-2260   °[facility] [address], [place] (336) 350-1642, phone; (336) 294-5005, fax Sees patients 1st and 3rd Saturday of every month.  Must not qualify for public or private insurance (i.e. Medicaid, Medicare, [place] Health Choice, Veterans' Benefits) • Household income should be no more than 200% of the poverty level •The clinic cannot treat you if you are pregnant or think you are pregnant • Sexually transmitted diseases are not treated at the clinic.  ° ° °Dental Care: °Organization         Address  Phone  Notes  °[place] [facility] [address], [place] (336) 641-6152 Accepts children up to age 21 who are enrolled in Medicaid or [place] Health Choice; pregnant women with a Medicaid card; and children who have applied for Medicaid or [place] Health Choice, but were declined, whose parents can pay a reduced fee at time of service.  °[place]  [facility] [place]  [address], [place] (336) 641-7733 Accepts children up to age 21 who are enrolled in Medicaid or [place] Health Choice; pregnant women with a Medicaid card; and children who have applied for Medicaid or  Health Choice, but were declined, whose parents can pay a reduced fee at time of service.  °[facility] ° [address],  (336) 641-4533 Patients are seen by appointment only. Walk-ins are not accepted. [facility] will see patients 18 years of age and older. °Monday - Tuesday (8am-5pm) °Most Wednesdays (8:30-5pm) °$30 per visit, cash only  °[facility] ° [address], [place] (336) 641-4533 Patients are seen by appointment only. Walk-ins are not accepted. [facility] will see patients 18 years of age and older. °One   Wednesday Evening (Monthly: Volunteer Based).  $30 per visit, cash only  °[facility]  (919) 537-3737 for adults; Children under age 4, call Graduate Pediatric Dentistry at (919) [phone]. Children aged 4-14, please call (919) [phone] to request a pediatric application. ° Dental services are provided in all areas of dental care including fillings, crowns and bridges, complete and partial dentures, implants, gum treatment, root canals, and extractions. Preventive care is also provided. Treatment is provided to both adults and children. °Patients are selected via a lottery and there is often a waiting list. °  °[facility] [address], °[place] ° (336) 763-8833 [email or web address] °  °[facility] [address], [place], [place] (336)723-1848, Ext. 123 Second and Fourth Thursday of each month, opens at 6:30 AM; Clinic ends at 9 AM.  Patients are seen on a first-come first-served basis, and a limited number are seen during each clinic.  ° °[facility] ° [address], [place], [place] (336) [phone]    Eligibility Requirements °You must have lived in [place], or [place] counties for at least the last three months. °  You cannot be eligible for state or federal sponsored healthcare insurance, including Veterans Administration, Medicaid, or Medicare. °  You generally cannot be eligible for healthcare insurance through your employer.  °  How to apply: °Eligibility screenings are held every Tuesday and Wednesday afternoon from 1:00 pm until 4:00 pm. You do not need an appointment for the interview!  °[facility] [address], [place], [place] 336-631-2330   °[facility]  336-342-8273   °[facility]  336-703-3100   °[facility]  336-570-6415   ° °Behavioral Health Resources in the Community: °Intensive Outpatient Programs °Organization         Address  Phone  Notes  °[facility] [address], [place], [place] 336-878-6098   °[facility] [address], [place], [place] 336-832-9800   °[facility] [address], [place], [place] ° 336-882-2125   °[facility] [address],  °[place], [place] 1-800-853-5163 or 336-641-4981   °Substance Abuse Resources °Organization         Address  Phone  Notes  °[facility]  336-882-2125   °[facility]  336-784-9470   °[facility]  336-285-9073   °[facility]  336-845-3988   °Residential & Outpatient Substance Abuse Program  1-800-659-3381   °Psychological Services °Organization         Address  Phone  Notes  °[facility]  336- 832-9600   °[facility]  336- 378-7881   °[facility] [address], [place] 1-800-853-5163 or 336-641-4981   ° °Mobile Crisis Teams °Organization         Address  Phone  Notes  °[facility], Mobile Crisis Care Unit  1-877-626-1772   °Assertive °[facility] ° [address]  [place], [place] 336-834-9664   °Sharon DeEsch [address] °[place] 336-554-5454   ° °Self-Help/Support Groups °Organization         Address  Phone             Notes  °[facility]  - variety of support groups  336- 373-1402 Call for more information  °Narcotics Anonymous (NA), [facility] [address], °[place]   2 meetings at this location  ° °  Residential Treatment Programs Organization         Address  Phone  Notes  [facility] [address]    [place]  1-7094146546   [facility]  [address], [place] [id number], [place], [place]   [place], [place] [phone] Admissions: 8am-3pm M-F  [facility] [place] [address],    [place], [place] [phone]   [facility] [address] [place], [place], [place]   [facility] [address]  [place], [place]   [facility] Intensive Outpatient [address], [place], [place]   [facility]  [place], [place] 1-(513)844-8850 or (978) 668-7621   [facility] [address], [place], [place] Accepts Medicaid  [facility] [address],  [place] 1-(234)185-7765 Substance Abuse/Addiction Treatment   [facility]         Address  Phone  Notes  [facility]  647-857-3569   Domenic Schwab, PhD [address] Arlis [place]a [place], [place]   3610333512 or 669-502-9245   [place], [place] 810-557-9652   [facility] [address] [place], [place] 850-017-5644 Insurance/Medicaid/sponsorship through [facility] and Families [address] [place]                                    [place], [place] [phone] [place] [address], [place] [phone]    Dr. Adele Schilder  [phone]   [facility] 1) [address] [place] 2) [place] 3)  [place] [address], [place] [phone]  [phone]   [place] 843-416-9344 or [phone] (After Hours)      Take the prescriptions as directed.  Use your albuterol inhaler (2 to 4 puffs) every 4 hours for the next 7 days, then as needed for cough, wheezing, or shortness of breath. Apply moist heat or ice to the area(s) of discomfort, for 15 minutes at a time, several times per day for the next few days.  Do not fall asleep on a heating or ice pack.  Call your regular medical doctor Monday morning to schedule a follow up appointment within the next 3 days.  Return to the Emergency Department immediately sooner if worsening.

## 2015-09-07 NOTE — ED Notes (Signed)
Patient offered wheelchair and refused.

## 2015-09-07 NOTE — ED Notes (Signed)
Cough for 4 days, hurting on the left side of my chest when I breathe, fatigued, and having shortness of breath. Have tried OTC medications without relief. Also think I am getting a sinus infection.

## 2015-09-26 NOTE — Telephone Encounter (Signed)
error 

## 2017-04-21 ENCOUNTER — Emergency Department (HOSPITAL_COMMUNITY): Payer: Self-pay

## 2017-04-21 ENCOUNTER — Encounter (HOSPITAL_COMMUNITY): Payer: Self-pay | Admitting: Emergency Medicine

## 2017-04-21 ENCOUNTER — Emergency Department (HOSPITAL_COMMUNITY)
Admission: EM | Admit: 2017-04-21 | Discharge: 2017-04-21 | Disposition: A | Discharge status: Home / Self Care | Payer: Self-pay | Attending: Emergency Medicine | Admitting: Emergency Medicine

## 2017-04-21 DIAGNOSIS — Z79899 Other long term (current) drug therapy: Secondary | ICD-10-CM | POA: Insufficient documentation

## 2017-04-21 DIAGNOSIS — R1084 Generalized abdominal pain: Secondary | ICD-10-CM | POA: Insufficient documentation

## 2017-04-21 DIAGNOSIS — E039 Hypothyroidism, unspecified: Secondary | ICD-10-CM | POA: Insufficient documentation

## 2017-04-21 DIAGNOSIS — F17210 Nicotine dependence, cigarettes, uncomplicated: Secondary | ICD-10-CM | POA: Insufficient documentation

## 2017-04-21 LAB — CBC
HCT: 44.5 % (ref 36.0–46.0)
Hemoglobin: 15.4 g/dL — ABNORMAL HIGH (ref 12.0–15.0)
MCH: 29.2 pg (ref 26.0–34.0)
MCHC: 34.6 g/dL (ref 30.0–36.0)
MCV: 84.3 fL (ref 78.0–100.0)
PLATELETS: 252 10*3/uL (ref 150–400)
RBC: 5.28 MIL/uL — ABNORMAL HIGH (ref 3.87–5.11)
RDW: 14.2 % (ref 11.5–15.5)
WBC: 11 10*3/uL — ABNORMAL HIGH (ref 4.0–10.5)

## 2017-04-21 LAB — URINALYSIS, ROUTINE W REFLEX MICROSCOPIC
Bilirubin Urine: NEGATIVE
Glucose, UA: NEGATIVE mg/dL
Ketones, ur: NEGATIVE mg/dL
LEUKOCYTES UA: NEGATIVE
NITRITE: NEGATIVE
Protein, ur: NEGATIVE mg/dL
SPECIFIC GRAVITY, URINE: 1.013 (ref 1.005–1.030)
pH: 5 (ref 5.0–8.0)

## 2017-04-21 LAB — COMPREHENSIVE METABOLIC PANEL
ALK PHOS: 71 U/L (ref 38–126)
ALT: 30 U/L (ref 14–54)
AST: 23 U/L (ref 15–41)
Albumin: 4.1 g/dL (ref 3.5–5.0)
Anion gap: 10 (ref 5–15)
BILIRUBIN TOTAL: 0.4 mg/dL (ref 0.3–1.2)
BUN: 20 mg/dL (ref 6–20)
CALCIUM: 9.4 mg/dL (ref 8.9–10.3)
CHLORIDE: 105 mmol/L (ref 101–111)
CO2: 24 mmol/L (ref 22–32)
CREATININE: 0.82 mg/dL (ref 0.44–1.00)
Glucose, Bld: 117 mg/dL — ABNORMAL HIGH (ref 65–99)
Potassium: 4.2 mmol/L (ref 3.5–5.1)
Sodium: 139 mmol/L (ref 135–145)
Total Protein: 7.7 g/dL (ref 6.5–8.1)

## 2017-04-21 LAB — POC OCCULT BLOOD, ED: FECAL OCCULT BLD: NEGATIVE

## 2017-04-21 MED ORDER — SODIUM CHLORIDE 0.9 % IV SOLN: INTRAVENOUS | Status: DC

## 2017-04-21 MED ORDER — ONDANSETRON HCL 4 MG/2ML IJ SOLN
4.0000 mg | Freq: Once | INTRAMUSCULAR | Status: AC
  Administered 2017-04-21: 4 mg via INTRAVENOUS
  Filled 2017-04-21: qty 2

## 2017-04-21 MED ORDER — IOPAMIDOL (ISOVUE-300) INJECTION 61%
100.0000 mL | Freq: Once | INTRAVENOUS | Status: AC | PRN
  Administered 2017-04-21: 100 mL via INTRAVENOUS

## 2017-04-21 MED ORDER — FENTANYL CITRATE (PF) 100 MCG/2ML IJ SOLN
100.0000 ug | INTRAMUSCULAR | Status: DC | PRN
  Administered 2017-04-21: 100 ug via INTRAVENOUS
  Filled 2017-04-21: qty 2

## 2017-04-21 MED ORDER — SODIUM CHLORIDE 0.9 % IV BOLUS (SEPSIS)
500.0000 mL | Freq: Once | INTRAVENOUS | Status: AC
  Administered 2017-04-21: 500 mL via INTRAVENOUS

## 2017-04-21 NOTE — Discharge Instructions (Signed)
Testing today did not show any complications in the abdomen, which would be concerning for the type of pain that you are having, which was associated with some rectal bleeding.  Your pain may be coming from your back, where you have spinal stenosis.  It is important to follow-up with your primary care doctor to discuss the lumbar spine abnormality, and arrange for further treatment as needed.

## 2017-04-21 NOTE — ED Triage Notes (Signed)
Patient complaining of lower abdominal pain with bloody stools yesterday. States pain is still there but has not noticed any blood from rectum today.

## 2017-04-21 NOTE — ED Provider Notes (Signed)
[facility] DEPT Provider Note   CSN: [id number] Arrival date & time: 04/21/17  1132     History   Chief Complaint Chief Complaint  Patient presents with  . Abdominal Pain    HPI Daisy [name] is a 56 y.o. female. She presents for evaluation of lower abdominal pain crampy in nature, intermittent, present for 3 days.  She became more concerned last night when she passed some blood clots.  She states that these were small and looked like little bits of blood.  She has been nauseated without vomiting.  She denies fever, chills, cough, shortness of breath, weakness or dizziness.  No similar problems in the past.  There are no other known modifying factors.   HPI  Past Medical History:  Diagnosis Date  . Chronic pain syndrome   . Depression   . Fatigue   . Fibromyalgia   . Normal cardiac stress test 2002  . Thyroid disease     Patient Active Problem List   Diagnosis Date Noted  . Prehypertension 06/02/2013  . Unspecified hypothyroidism 04/29/2013  . Chronic pain syndrome 04/29/2013  . Other malaise and fatigue 03/02/2013  . Elevated BP 02/17/2013  . Depression 02/17/2013  . Fibromyalgia 02/17/2013  . Insomnia 02/17/2013    Past Surgical History:  Procedure Laterality Date  . APPENDECTOMY    . CESAREAN SECTION     twice '84 and '89  . CHOLECYSTECTOMY    . ECTOPIC PREGNANCY SURGERY    . FINGER SURGERY    . HERNIA REPAIR  2004 and 2005   twice  same site after cholecystectomy  . NASAL SINUS SURGERY     twice -enlarged sinuses and cleared scar tissue  . TONSILLECTOMY      OB History    No data available       Home Medications    Prior to Admission medications   Medication Sig Start Date End Date Taking? Authorizing Provider  Aspirin-Acetaminophen-Caffeine (GOODY HEADACHE PO) Take 1 Package by mouth daily as needed (pain).   Yes [provider]  cyclobenzaprine (FLEXERIL) 10 MG tablet Take 10 mg by mouth at bedtime.   Yes [provider]  indomethacin (INDOCIN) 25 MG capsule Take 50 mg by mouth 3 (three) times daily as needed for mild pain or moderate pain.   Yes [provider]  levothyroxine (SYNTHROID, LEVOTHROID) 50 MCG tablet Take 50 mcg by mouth daily before breakfast.   Yes [provider]    Family History Family History  Problem Relation Age of Onset  . Heart disease Father     Social History Social History  Substance Use Topics  . Smoking status: Current Every Day Smoker    Packs/day: 1.00    Types: Cigarettes  . Smokeless tobacco: Never Used  . Alcohol use No     Allergies   Patient has no known allergies.   Review of Systems Review of Systems  All other systems reviewed and are negative.    Physical Exam Updated Vital Signs BP 140/73   Pulse 71   Temp 98.1 F (36.7 C) (Oral)   Resp 17   Ht 5\' 3"  (1.6 m)   Wt 83.5 kg (184 lb)   SpO2 97%   BMI 32.59 kg/m   Physical Exam  Constitutional: She is oriented to person, place, and time. She appears well-developed. No distress.  Appears older than stated age.  HENT:  Head: Normocephalic and atraumatic.  Eyes: Pupils are equal, round, and reactive to  light. Conjunctivae and EOM are normal.  Neck: Normal range of motion and phonation normal. Neck supple.  Cardiovascular: Normal rate and regular rhythm.   Pulmonary/Chest: Effort normal and breath sounds normal. She exhibits no tenderness.  Abdominal: Soft. She exhibits no distension and no mass. There is tenderness (Bilateral lower quadrants, mild.). There is no rebound and no guarding.  Genitourinary:  Genitourinary Comments: Normal anus.  No hemorrhoids or fissuring.  Small amount of brown stool in rectal vault.  Occult testing done by me is negative.  Musculoskeletal: Normal range of motion.  Neurological: She is alert and oriented to person, place, and time. She exhibits normal muscle tone.  Skin: Skin is warm and dry.  Psychiatric: She has a normal mood and affect.  Her behavior is normal. Judgment and thought content normal.  Nursing note and vitals reviewed.    ED Treatments / Results  Labs (all labs ordered are listed, but only abnormal results are displayed) Labs Reviewed  COMPREHENSIVE METABOLIC PANEL - Abnormal; Notable for the following:       Result Value   Glucose, Bld 117 (*)    All other components within normal limits  CBC - Abnormal; Notable for the following:    WBC 11.0 (*)    RBC 5.28 (*)    Hemoglobin 15.4 (*)    All other components within normal limits  URINALYSIS, ROUTINE W REFLEX MICROSCOPIC - Abnormal; Notable for the following:    Hgb urine dipstick MODERATE (*)    Bacteria, UA RARE (*)    Squamous Epithelial / LPF 0-5 (*)    All other components within normal limits  POC OCCULT BLOOD, ED  POC OCCULT BLOOD, ED    EKG  EKG Interpretation None       Radiology Ct Abdomen Pelvis W Contrast  Result Date: 04/21/2017 CLINICAL DATA:  Lower abdominal pain with bloody stools yesterday, persistent pain today, some nausea EXAM: CT ABDOMEN AND PELVIS WITH CONTRAST TECHNIQUE: Multidetector CT imaging of the abdomen and pelvis was performed using the standard protocol following bolus administration of intravenous contrast. Sagittal and coronal MPR images reconstructed from axial data set. CONTRAST:  176mL ISOVUE-300 IOPAMIDOL (ISOVUE-300) INJECTION 61% IV. No oral contrast. COMPARISON:  05/04/2014 FINDINGS: Lower chest: Minimal subsegmental atelectasis LEFT lower lobe Hepatobiliary: Gallbladder surgically absent. No biliary dilatation or focal hepatic abnormality Pancreas: Normal appearance Spleen: Normal appearance Adrenals/Urinary Tract: Adrenal glands normal appearance. Small BILATERAL renal cysts largest a 3.0 x 2.1 cm peripelvic cysts LEFT kidney delayed image 19. No solid renal mass, hydronephrosis, ureteral calcification or ureteral dilatation. Bladder unremarkable. Stomach/Bowel: Appendix surgically absent by history.  Descending and sigmoid diverticulosis without evidence of diverticulitis. Rectum incompletely distended, unable to accurately assess wall thickness. Stomach and bowel loops otherwise normal appearance Vascular/Lymphatic: Atherosclerotic calcifications aorta without aneurysm. Enlarged periportal lymph node 14 mm short axis image 21 previously 13 mm. No additional adenopathy. Reproductive: Uterus and adnexa unremarkable Other: No free air or free fluid.  No hernia. Musculoskeletal: No acute osseous lesions. High-grade multifactorial spinal stenosis at L3-L4 likely with associated BILATERAL lateral recess stenosis. Mild degree of AP spinal stenosis multifactorial at L4-L5. IMPRESSION: BILATERAL renal cysts. Distal colonic diverticulosis without evidence of diverticulitis. Enlarged periportal lymph node not significantly changed since 2015. Spinal stenosis high-grade at L3-L4 and mild at L4-L5. Aortic Atherosclerosis (ICD10-I70.0). Electronically Signed   By: Lavonia Dana M.D.   On: 04/21/2017 17:29    Procedures Procedures (including critical care time)  Medications Ordered in ED Medications  0.9 %  sodium chloride infusion (not administered)  fentaNYL (SUBLIMAZE) injection 100 mcg (100 mcg Intravenous Given 04/21/17 1632)  sodium chloride 0.9 % bolus 500 mL (500 mLs Intravenous New Bag/Given 04/21/17 1631)  ondansetron (ZOFRAN) injection 4 mg (4 mg Intravenous Given 04/21/17 1632)  iopamidol (ISOVUE-300) 61 % injection 100 mL (100 mLs Intravenous Contrast Given 04/21/17 1703)     Initial Impression / Assessment and Plan / ED Course  I have reviewed the triage vital signs and the nursing notes.  Pertinent labs & imaging results that were available during my care of the patient were reviewed by me and considered in my medical decision making (see chart for details).      Patient Vitals for the past 24 hrs:  BP Temp Temp src Pulse Resp SpO2 Height Weight  04/21/17 1800 140/73 - - 71 17 97 % - -    04/21/17 1630 (!) 145/94 - - 72 17 96 % - -  04/21/17 1600 129/84 - - 64 16 96 % - -  04/21/17 1542 (!) 149/78 - - 82 19 98 % - -  04/21/17 1419 (!) 149/78 98.1 F (36.7 C) Oral 69 - 98 % - -  04/21/17 1145 (!) 140/93 98 F (36.7 C) Oral 98 20 96 % 5\' 3"  (1.6 m) 83.5 kg (184 lb)    6:21 PM Reevaluation with update and discussion. After initial assessment and treatment, an updated evaluation reveals she remains comfortable.  Findings discussed with the patient, and all questions were answered. Lynleigh Kovack L    Final Clinical Impressions(s) / ED Diagnoses   Final diagnoses:  None    Nonspecific abdominal pain with an episode of rectal bleeding, currently heme-negative stool in the ED.  Doubt colitis, bowel obstruction, intra-abdominal infection.  Incidental lumbar spinal stenosis, which is likely the source of her lower back pain, which is ongoing.  Doubt cauda equina or lumbar myelopathy.  Nursing Notes Reviewed/ Care Coordinated Applicable Imaging Reviewed Interpretation of Laboratory Data incorporated into ED treatment  The patient appears reasonably screened and/or stabilized for discharge and I doubt any other medical condition or other [facility] requiring further screening, evaluation, or treatment in the ED at this time prior to discharge.  Plan: Home Medications-continue usual medications; Home Treatments-rest, fluids; return here if the recommended treatment, does not improve the symptoms; Recommended follow up- PCP, as needed  New Prescriptions New Prescriptions   No medications on file     Daleen Bo, MD 04/21/17 [phone]

## 2017-04-21 NOTE — ED Notes (Signed)
Pt remains in CT

## 2017-04-21 NOTE — ED Notes (Signed)
Pt ambulatory to waiting room. Pt verbalized understanding of discharge instructions.   

## 2017-04-21 NOTE — ED Notes (Signed)
Pt taken to CT at this time.

## 2017-10-29 ENCOUNTER — Ambulatory Visit (HOSPITAL_COMMUNITY)
Admission: RE | Admit: 2017-10-29 | Discharge: 2017-10-29 | Disposition: A | Discharge status: Home / Self Care | Payer: Self-pay | Source: Ambulatory Visit | Attending: *Deleted | Admitting: *Deleted

## 2017-10-29 ENCOUNTER — Other Ambulatory Visit (HOSPITAL_COMMUNITY): Payer: Self-pay | Admitting: *Deleted

## 2017-10-29 DIAGNOSIS — R509 Fever, unspecified: Secondary | ICD-10-CM

## 2017-10-29 DIAGNOSIS — R058 Other specified cough: Secondary | ICD-10-CM

## 2017-10-29 DIAGNOSIS — R05 Cough: Secondary | ICD-10-CM | POA: Insufficient documentation

## 2018-12-06 ENCOUNTER — Ambulatory Visit: Payer: Self-pay | Admitting: Family Medicine

## 2018-12-06 ENCOUNTER — Encounter: Payer: Self-pay | Admitting: Family Medicine

## 2018-12-06 VITALS — BP 124/84 | Ht 63.0 in | Wt 208.4 lb

## 2018-12-06 DIAGNOSIS — R519 Headache, unspecified: Secondary | ICD-10-CM | POA: Insufficient documentation

## 2018-12-06 DIAGNOSIS — M25541 Pain in joints of right hand: Secondary | ICD-10-CM | POA: Insufficient documentation

## 2018-12-06 DIAGNOSIS — M542 Cervicalgia: Secondary | ICD-10-CM | POA: Insufficient documentation

## 2018-12-06 DIAGNOSIS — D352 Benign neoplasm of pituitary gland: Secondary | ICD-10-CM | POA: Insufficient documentation

## 2018-12-06 DIAGNOSIS — E039 Hypothyroidism, unspecified: Secondary | ICD-10-CM

## 2018-12-06 DIAGNOSIS — M25542 Pain in joints of left hand: Secondary | ICD-10-CM

## 2018-12-06 DIAGNOSIS — T3995XA Adverse effect of unspecified nonopioid analgesic, antipyretic and antirheumatic, initial encounter: Secondary | ICD-10-CM

## 2018-12-06 DIAGNOSIS — R51 Headache: Secondary | ICD-10-CM

## 2018-12-06 DIAGNOSIS — G4440 Drug-induced headache, not elsewhere classified, not intractable: Secondary | ICD-10-CM | POA: Insufficient documentation

## 2018-12-06 MED ORDER — TOPIRAMATE 50 MG PO TABS: ORAL_TABLET | ORAL | 4 refills | Status: DC

## 2018-12-06 MED ORDER — LEVOTHYROXINE SODIUM 50 MCG PO TABS: ORAL_TABLET | ORAL | 4 refills | Status: DC

## 2018-12-06 NOTE — Progress Notes (Signed)
Subjective:    Patient ID: Daisy [name], female    DOB: December 02, 1960, 58 y.o.   MRN: [id number]  Headache   This is a chronic problem. Associated symptoms include eye pain and a visual change. Pertinent negatives include no abdominal pain, coughing, dizziness, nausea, rhinorrhea, vomiting or weakness.  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   Pt is suppose to be taking medication but has been unable to obtain a PCP.   Pt also is having pain in hands. Unable to grasp items and painful.   She relates a lot of stiffness in her joints but it is all day long she also states if she does a lot of hand intensive activities she gets numbness and tingling in her hands.  This patient also miss uses BC powders takes multiple once daily sometimes up to 10 a day she also relates a little bit of ringing in her ears and as well as having intermittent blurred vision and nausea.  No seizure activity.  No vomiting.  Review of Systems  Constitutional: Negative for activity change and appetite change.  HENT: Negative for congestion and rhinorrhea.   Eyes: Positive for pain.  Respiratory:  Negative for cough and shortness of breath.   Cardiovascular: Negative for chest pain and leg swelling.  Gastrointestinal: Negative for abdominal pain, nausea and vomiting.  Skin: Negative for color change.  Neurological: Positive for headaches. Negative for dizziness and weakness.  Psychiatric/Behavioral: Negative for agitation and confusion.       Objective:   Physical Exam Vitals signs reviewed.  Constitutional:      General: She is not in acute distress. HENT:     Head: Normocephalic and atraumatic.  Eyes:     General:        Right eye: No discharge.        Left eye: No discharge.  Neck:     Trachea: No tracheal deviation.  Cardiovascular:     Rate and Rhythm: Normal rate and regular rhythm.     Heart sounds: Normal heart sounds. No murmur.  Pulmonary:     Effort: Pulmonary effort is normal. No respiratory distress.     Breath sounds: Normal breath sounds.  Lymphadenopathy:     Cervical: No cervical adenopathy.  Skin:    General: Skin is warm and dry.  Neurological:     Mental Status: She is alert.     Coordination: Coordination normal.  Psychiatric:        Behavior: Behavior normal.           Assessment & Plan:  Hypothyroidism restart medication check TSH and free T4 if patient unable to get this or afford it I recommend her applying for Medicaid  Pituitary microadenoma will need an MRI of the brain but she is going to apply for Medicaid first if this does not get approved we may have to go through financial assistance at [facility] in order to get a MRI covered  Arthralgias of the hands more than likely osteoarthritis I do not feel it is gout I do not think it is rheumatoid arthritis currently patient unable to afford testing and treating but this can be done in the future  Intermittent numbness into the hands possible carpal tunnel may need further testing but for now cannot afford it  Cervical pain and discomfort may use Tylenol do not use ibuprofen  aspirins Naprosyn I would recommend cervical spine x-ray and probable MRI but currently patient is going to try to get Medicaid to help cover all of this  Frequent headaches part of this is rebound headaches patient needs a taper off of the BC powders over the next 10 to 14 days  Start Topamax 50 mg 1 twice daily patient to follow-up in 4 weeks time Patient to keep [place] updated on if she can get Medicaid

## 2018-12-07 LAB — TSH: TSH: 9.14 u[IU]/mL — AB (ref 0.450–4.500)

## 2018-12-07 LAB — T4, FREE: Free T4: 0.9 ng/dL (ref 0.82–1.77)

## 2018-12-07 LAB — SEDIMENTATION RATE: Sed Rate: 46 mm/hr — ABNORMAL HIGH (ref 0–40)

## 2018-12-31 ENCOUNTER — Other Ambulatory Visit (INDEPENDENT_AMBULATORY_CARE_PROVIDER_SITE_OTHER): Payer: Self-pay | Admitting: Family Medicine

## 2018-12-31 ENCOUNTER — Telehealth: Payer: Self-pay | Admitting: Family Medicine

## 2018-12-31 ENCOUNTER — Other Ambulatory Visit: Payer: Self-pay

## 2018-12-31 DIAGNOSIS — R6889 Other general symptoms and signs: Secondary | ICD-10-CM

## 2018-12-31 DIAGNOSIS — R059 Cough, unspecified: Secondary | ICD-10-CM

## 2018-12-31 DIAGNOSIS — R05 Cough: Secondary | ICD-10-CM

## 2018-12-31 MED ORDER — AMOXICILLIN-POT CLAVULANATE 875-125 MG PO TABS: ORAL_TABLET | ORAL | 0 refills | Status: DC

## 2018-12-31 NOTE — Telephone Encounter (Signed)
I spoke with the patient she states she developed two days ago a cough,sore throat,fever, some shortness of breath upon lying down, she has body aches and joint aches,facial pain and swelling.  She states she has been taking Claritin.  Per Dr.Steve he will call in an antibx, stay in placed do not go out. (to prevent spread of any viral illness)  Patient is aware we will notify her once the antibx is sent in. She states understanding about staying in place unless she develops significant shortness of breath and then go to the Ed.

## 2018-12-31 NOTE — Telephone Encounter (Signed)
Patient aware of all and informed this would be billed to her insurance as a tele visit.Medication sent to [facility] per her request. Cdc information sent via my chart. Patient states she did receive it.

## 2018-12-31 NOTE — Telephone Encounter (Signed)
Aug 875 bid with food, ten days, letse do a phone manaement visit on pt, nurses do you now know how to do your part?

## 2018-12-31 NOTE — Progress Notes (Signed)
   Subjective:    Patient ID: Daisy [name], female    DOB: [birth year], 58 y.o.   MRN: [id number]  HPI  Virtual Visit via Telephone Note  I connected with Daisy [name] on 12/31/18 at  2:00 PM EDT by telephone and verified that I am speaking with the correct person using two identifiers.   I discussed the limitations, risks, security and privacy concerns of performing an evaluation and management service by telephone and the availability of in person appointments. I also discussed with the patient that there may be a patient responsible charge related to this service. The patient expressed understanding and agreed to proceed.   History of Present Illness:    Observations/Objective:   Assessment and Plan:   Follow Up Instructions:    I discussed the assessment and treatment plan with the patient. The patient was provided an opportunity to ask questions and all were answered. The patient agreed with the plan and demonstrated an understanding of the instructions.  10 mins spent with the pt discussing this.  The patient was advised to call back or seek an in-person evaluation if the symptoms worsen or if the condition fails to improve as anticipated.  I provided 10 minutes of non-face-to-face time during this encounter.   Karle Barr, CMA   Review of Systems     Objective:   Physical Exam        Assessment & Plan:

## 2018-12-31 NOTE — Telephone Encounter (Signed)
Thinks she has a sinus infection. Blowing out green, side of face hurts. Congestion, low grade fever 100.1. Some cough, scratchy throat. No sob  (Recent travel to [place])  *Pt is scheduled for a phone visit on 4-2 for follow up on headaches)    [facility]

## 2019-01-06 ENCOUNTER — Ambulatory Visit: Payer: Medicaid Other | Admitting: Family Medicine

## 2019-01-06 ENCOUNTER — Ambulatory Visit (INDEPENDENT_AMBULATORY_CARE_PROVIDER_SITE_OTHER): Payer: Medicaid Other | Admitting: Family Medicine

## 2019-01-06 ENCOUNTER — Other Ambulatory Visit: Payer: Self-pay

## 2019-01-06 DIAGNOSIS — E038 Other specified hypothyroidism: Secondary | ICD-10-CM

## 2019-01-06 DIAGNOSIS — D352 Benign neoplasm of pituitary gland: Secondary | ICD-10-CM

## 2019-01-06 DIAGNOSIS — R519 Headache, unspecified: Secondary | ICD-10-CM

## 2019-01-06 DIAGNOSIS — R51 Headache: Secondary | ICD-10-CM

## 2019-01-06 MED ORDER — TOPIRAMATE 100 MG PO TABS
ORAL_TABLET | ORAL | 4 refills | Status: DC
Start: 2019-01-06 — End: 2021-04-22

## 2019-01-06 NOTE — Progress Notes (Signed)
   Subjective:    Patient ID: Daisy [name], female    DOB: September 20, 1961, 58 y.o.   MRN: [id number]  HPIfollow up on headaches. Started topamax 4 weeks ago. Still has a headache every day. Wakes up with headache and goes to sleep with headache.   Pt states she is taking ibuprofen 2 every 6 hours or so for arthritis pain. Having trouble sleeping due to pain. Pt waiting to get on medicaid before scheduling appt with rheumatologist.  Patient does relate that she is trying to get on Medicaid she will give [place] an update in several weeks She has frequent headaches but Topamax seems to be helping She is taking antibiotics for a sinus infection Energy level improving with her thyroid medicine Virtual Visit via Telephone Note Video phone was completed today I connected with Daisy [name] on 01/06/19 at  9:30 AM EDT by telephone and verified that I am speaking with the correct person using two identifiers.   I discussed the limitations, risks, security and privacy concerns of performing an evaluation and management service by telephone and the availability of in person appointments. I also discussed with the patient that there may be a patient responsible charge related to this service. The patient expressed understanding and agreed to proceed.   History of Present Illness:    Observations/Objective:   Assessment and Plan:   Follow Up Instructions:    I discussed the assessment and treatment plan with the patient. The patient was provided an opportunity to ask questions and all were answered. The patient agreed with the plan and demonstrated an understanding of the instructions.   The patient was advised to call back or seek an in-person evaluation if the symptoms worsen or if the condition fails to improve as anticipated.  I provided 17 minutes of non-face-to-face time during this encounter.   Dayton Bailiff, LPN   15 minutes was spent with patient today discussing healthcare  issues which they came.  More than 50% of this visit-total duration of visit-was spent in counseling and coordination of care.  Please see diagnosis regarding the focus of this coordination and care   Review of Systems     Objective:   Physical Exam  Unable to do physical exam because virtual visit      Assessment & Plan:  Headaches-she is successfully gotten off of the [facility] powders. Thyroid energy level is coming up she is taking the medication she will notify us if any ongoing troubles She will do her lab work toward mid May Her frequent headaches getting better on the Topamax but we will bump up the dose to 100 mg twice daily she will let us know how that is doing via my chart message into proximally 3 to 4 weeks She will finish out the antibiotics for sinus infection  I do recommend an MRI of the brain but she will hold off until she gets Medicaid she will let us know it may

## 2019-01-11 ENCOUNTER — Encounter: Payer: Self-pay | Admitting: Family Medicine

## 2019-01-11 NOTE — Telephone Encounter (Signed)
Can you please have someone work with the patient to find out if her Medicaid is still active?  Have someone from the administrative side work with her. If the Medicaid is still active that will help if not then we will have to work without the Medicaid and get her set up for MRI through the hospital but the hospital system is not doing MRIs currently because of the coronavirus pandemic.  More than likely they will not be doing any of this type of testing until mid or late May  As for the patient's symptomatology the next step would be neurology referral and MRI but hospital system is not doing any MRIs currently until coronavirus pandemic settles down.  I would recommend the patient give the Topamax time to work. The increased dose of the Topamax could take a few weeks to see benefit. I would like to have the patient give [place] an update in a couple weeks how she is doing.  Certainly if things are getting worse over the next couple weeks to notify us sooner.   Stop the indomethacin. I recommend trying Naprosyn 500 mg 1 twice daily as needed headaches not for frequent use, #30

## 2019-01-12 ENCOUNTER — Other Ambulatory Visit: Payer: Self-pay | Admitting: Family Medicine

## 2019-01-12 MED ORDER — NAPROXEN 500 MG PO TABS: ORAL_TABLET | ORAL | 0 refills | Status: DC

## 2019-01-26 ENCOUNTER — Encounter: Payer: Self-pay | Admitting: Family Medicine

## 2019-01-31 ENCOUNTER — Other Ambulatory Visit: Payer: Self-pay | Admitting: Family Medicine

## 2019-01-31 MED ORDER — CITALOPRAM HYDROBROMIDE 20 MG PO TABS: 20.0000 mg | ORAL_TABLET | Freq: Every day | ORAL | 0 refills | Status: DC

## 2019-01-31 NOTE — Telephone Encounter (Signed)
We did discuss this I am fine with starting a medication Verify with patient that she is not suicidal We can start Celexa 20 mg 1 daily #30 no refills Tell the patient to initially take half tablet daily for the first 5 days then 1 tablet thereafter daily  I recommend a follow-up in 3 weeks virtual to see how she is doing certainly the patient needs notify us if she feels her depression getting worse or becoming suicidal before her follow-up in 3 weeks

## 2019-02-04 ENCOUNTER — Other Ambulatory Visit: Payer: Self-pay | Admitting: Family Medicine

## 2019-03-01 ENCOUNTER — Other Ambulatory Visit: Payer: Self-pay | Admitting: Family Medicine

## 2019-03-02 ENCOUNTER — Other Ambulatory Visit: Payer: Self-pay

## 2019-03-02 MED ORDER — LEVOTHYROXINE SODIUM 50 MCG PO TABS: ORAL_TABLET | ORAL | 4 refills | Status: DC

## 2019-03-02 NOTE — Telephone Encounter (Signed)
May have this +2 refills needs office visit this summer

## 2019-04-13 ENCOUNTER — Ambulatory Visit (INDEPENDENT_AMBULATORY_CARE_PROVIDER_SITE_OTHER): Payer: Self-pay | Admitting: Family Medicine

## 2019-04-13 ENCOUNTER — Telehealth: Payer: Self-pay | Admitting: Family Medicine

## 2019-04-13 ENCOUNTER — Other Ambulatory Visit: Payer: Self-pay

## 2019-04-13 DIAGNOSIS — G935 Compression of brain: Secondary | ICD-10-CM

## 2019-04-13 DIAGNOSIS — R51 Headache: Secondary | ICD-10-CM

## 2019-04-13 DIAGNOSIS — D352 Benign neoplasm of pituitary gland: Secondary | ICD-10-CM

## 2019-04-13 DIAGNOSIS — R519 Headache, unspecified: Secondary | ICD-10-CM

## 2019-04-13 MED ORDER — HYDROCODONE-ACETAMINOPHEN 5-325 MG PO TABS: ORAL_TABLET | ORAL | 0 refills | Status: DC

## 2019-04-13 NOTE — Telephone Encounter (Signed)
Pt called back, info given, pt verbalized understanding

## 2019-04-13 NOTE — Telephone Encounter (Signed)
[facility] - need to give pt her MRI appt info  Monday 04/19/2019, arrive [facility] radiology 4:30pm - please come alone & wear a mask   If need to reschedule please call [phone]

## 2019-04-13 NOTE — Progress Notes (Signed)
   Subjective:    Patient ID: Daisy [name], female    DOB: 05-30-1961, 58 y.o.   MRN: [id number]  Headache  This is a new problem. Episode onset: going on for a while but getting worse. Pain location: back of neck. Radiates to: radiates up head. The quality of the pain is described as aching. Associated symptoms include nausea. Pertinent negatives include no abdominal pain, coughing, dizziness, rhinorrhea, vomiting or weakness. Treatments tried: Topamax. The treatment provided no relief.    This patient had a pituitary microadenoma she states it was treated by radiation.  This is back in 2005.  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  No double vision no vomiting.  She is not under the care of any specialist.  Patient has been under our care over the past 3 months  According to medical record last MRI 2005 Virtual Visit via Video Note  I connected with Daisy [name] on 04/13/19 at  9:30 AM EDT by a video enabled telemedicine application and verified that I am speaking with the correct person using two identifiers.  Location: Patient: home Provider: office   I discussed the limitations of evaluation and management by telemedicine and the availability of in person appointments. The patient expressed understanding and agreed to proceed.  History of Present Illness:    Observations/Objective:   Assessment and Plan:   Follow Up Instructions:    I discussed the assessment and treatment plan with the patient. The patient was provided an opportunity to ask questions and all were answered. The patient agreed with the plan and demonstrated an understanding of the instructions.   The patient was  advised to call back or seek an in-person evaluation if the symptoms worsen or if the condition fails to improve as anticipated.  I provided 15 minutes of non-face-to-face time during this encounter.   Vicente Males, LPN    Review of Systems  Constitutional: Negative for activity change and appetite change.  HENT: Negative for congestion and rhinorrhea.   Respiratory: Negative for cough and shortness of breath.   Cardiovascular: Negative for chest pain and leg swelling.  Gastrointestinal: Positive for nausea. Negative for abdominal pain and vomiting.  Skin: Negative for color change.  Neurological: Positive for headaches. Negative for dizziness and weakness.  Psychiatric/Behavioral: Negative for agitation and confusion.       Objective:   Physical Exam  Patient had virtual visit Appears to be in no distress Atraumatic Neuro able to relate and oriented No apparent resp distress Color normal       Assessment & Plan:  We will set up MRI of the brain with and without contrast still look for pituitary adenoma as well as the cherry malformation because of frequent headaches  We will do lab work await the results of this.

## 2019-04-14 LAB — CBC WITH DIFFERENTIAL/PLATELET
Basophils Absolute: 0.1 10*3/uL (ref 0.0–0.2)
Basos: 1 %
EOS (ABSOLUTE): 0.3 10*3/uL (ref 0.0–0.4)
Eos: 3 %
Hematocrit: 43.8 % (ref 34.0–46.6)
Hemoglobin: 15.2 g/dL (ref 11.1–15.9)
Immature Grans (Abs): 0.1 10*3/uL (ref 0.0–0.1)
Immature Granulocytes: 1 %
Lymphocytes Absolute: 3.1 10*3/uL (ref 0.7–3.1)
Lymphs: 30 %
MCH: 30.2 pg (ref 26.6–33.0)
MCHC: 34.7 g/dL (ref 31.5–35.7)
MCV: 87 fL (ref 79–97)
Monocytes Absolute: 0.7 10*3/uL (ref 0.1–0.9)
Monocytes: 7 %
Neutrophils Absolute: 6.3 10*3/uL (ref 1.4–7.0)
Neutrophils: 58 %
Platelets: 238 10*3/uL (ref 150–450)
RBC: 5.03 x10E6/uL (ref 3.77–5.28)
RDW: 14.3 % (ref 11.7–15.4)
WBC: 10.6 10*3/uL (ref 3.4–10.8)

## 2019-04-14 LAB — BASIC METABOLIC PANEL
BUN/Creatinine Ratio: 17 (ref 9–23)
BUN: 19 mg/dL (ref 6–24)
CO2: 22 mmol/L (ref 20–29)
Calcium: 9.2 mg/dL (ref 8.7–10.2)
Chloride: 103 mmol/L (ref 96–106)
Creatinine, Ser: 1.09 mg/dL — ABNORMAL HIGH (ref 0.57–1.00)
GFR calc Af Amer: 65 mL/min/{1.73_m2} (ref >59)
GFR calc non Af Amer: 56 mL/min/{1.73_m2} — ABNORMAL LOW (ref >59)
Glucose: 113 mg/dL — ABNORMAL HIGH (ref 65–99)
Potassium: 3.9 mmol/L (ref 3.5–5.2)
Sodium: 138 mmol/L (ref 134–144)

## 2019-04-14 LAB — TSH: TSH: 3.33 u[IU]/mL (ref 0.450–4.500)

## 2019-04-14 LAB — PROLACTIN: Prolactin: 16.2 ng/mL (ref 4.8–23.3)

## 2019-04-14 LAB — T4, FREE: Free T4: 0.98 ng/dL (ref 0.82–1.77)

## 2019-04-19 ENCOUNTER — Other Ambulatory Visit: Payer: Self-pay

## 2019-04-19 ENCOUNTER — Ambulatory Visit (HOSPITAL_COMMUNITY)
Admission: RE | Admit: 2019-04-19 | Discharge: 2019-04-19 | Disposition: A | Discharge status: Home / Self Care | Payer: Self-pay | Source: Ambulatory Visit | Attending: Family Medicine | Admitting: Family Medicine

## 2019-04-19 DIAGNOSIS — R51 Headache: Secondary | ICD-10-CM | POA: Insufficient documentation

## 2019-04-19 MED ORDER — GADOBUTROL 1 MMOL/ML IV SOLN
10.0000 mL | Freq: Once | INTRAVENOUS | Status: AC | PRN
  Administered 2019-04-19: 10 mL via INTRAVENOUS

## 2019-04-20 ENCOUNTER — Telehealth: Payer: Self-pay | Admitting: Family Medicine

## 2019-04-20 NOTE — Telephone Encounter (Signed)
Wants results of MRI she had yesterday.

## 2019-04-21 ENCOUNTER — Telehealth: Payer: Self-pay | Admitting: Family Medicine

## 2019-04-21 DIAGNOSIS — M542 Cervicalgia: Secondary | ICD-10-CM

## 2019-04-21 MED ORDER — AMOXICILLIN 500 MG PO CAPS: 500.0000 mg | ORAL_CAPSULE | Freq: Three times a day (TID) | ORAL | 0 refills | Status: DC

## 2019-04-21 NOTE — Telephone Encounter (Signed)
Also see response in result note

## 2019-04-21 NOTE — Addendum Note (Signed)
Addended by: Dairl Ponder on: 04/21/2019 10:49 AM   Modules accepted: Orders

## 2019-04-21 NOTE — Telephone Encounter (Signed)
Pt called, states neck pain is no better, MRI of brain was normal  Pt is at her breaking point, in a lot of pain, would like scan of her neck (x-ray, MRI, or CT) due to severe worsening pain in her neck  Upset that a neck scan was not ordered already since that is where pain started.  Pt does not want to go to the ER due to cost & currently does not have any insurance    Please advise & call pt ASAP

## 2019-04-21 NOTE — Telephone Encounter (Signed)
Start first with plain x-rays of the neckThis could be done on Friday Go ahead and order MRI of the cervical spine

## 2019-04-21 NOTE — Telephone Encounter (Signed)
Please see result note 

## 2019-04-22 ENCOUNTER — Other Ambulatory Visit: Payer: Self-pay

## 2019-04-22 ENCOUNTER — Ambulatory Visit (HOSPITAL_COMMUNITY)
Admission: RE | Admit: 2019-04-22 | Discharge: 2019-04-22 | Disposition: A | Discharge status: Home / Self Care | Payer: Medicaid Other | Source: Ambulatory Visit | Attending: Family Medicine | Admitting: Family Medicine

## 2019-04-22 DIAGNOSIS — M542 Cervicalgia: Secondary | ICD-10-CM | POA: Insufficient documentation

## 2019-04-22 NOTE — Telephone Encounter (Signed)
Orders placed in Epic. Patient notified and will go to the hospital today for her cervical xray

## 2019-04-23 ENCOUNTER — Encounter: Payer: Self-pay | Admitting: Family Medicine

## 2019-04-25 ENCOUNTER — Other Ambulatory Visit: Payer: Self-pay | Admitting: Family Medicine

## 2019-04-25 MED ORDER — OXYCODONE-ACETAMINOPHEN 5-325 MG PO TABS: 1.0000 | ORAL_TABLET | ORAL | 0 refills | Status: DC | PRN

## 2019-04-25 NOTE — Telephone Encounter (Signed)
Please send patient notification that I sent in a prescription of Percocet for her we will await the results of her MRI

## 2019-04-25 NOTE — Telephone Encounter (Signed)
Nurses please talk with patient She has some options I can either go to a stronger dose of hydrocodone or I can prescribe her some Percocet for short-term use As for the nerve medication this is not something we can do because they do not get along with pain medication We will know more about what is going on with her once she does her MRI typically can take 1 or 2 days to get results

## 2019-04-26 ENCOUNTER — Ambulatory Visit (HOSPITAL_COMMUNITY)
Admission: RE | Admit: 2019-04-26 | Discharge: 2019-04-26 | Disposition: A | Discharge status: Home / Self Care | Payer: Self-pay | Source: Ambulatory Visit | Attending: Family Medicine | Admitting: Family Medicine

## 2019-04-26 ENCOUNTER — Other Ambulatory Visit: Payer: Self-pay

## 2019-04-26 ENCOUNTER — Encounter: Payer: Self-pay | Admitting: Family Medicine

## 2019-04-26 DIAGNOSIS — M542 Cervicalgia: Secondary | ICD-10-CM | POA: Insufficient documentation

## 2019-04-27 ENCOUNTER — Other Ambulatory Visit: Payer: Self-pay | Admitting: Family Medicine

## 2019-04-27 NOTE — Addendum Note (Signed)
Addended by: Vicente Males on: 04/27/2019 02:57 PM   Modules accepted: Orders

## 2019-04-28 ENCOUNTER — Encounter: Payer: Self-pay | Admitting: Family Medicine

## 2019-05-03 ENCOUNTER — Encounter: Payer: Self-pay | Admitting: Family Medicine

## 2019-05-05 ENCOUNTER — Other Ambulatory Visit: Payer: Self-pay | Admitting: *Deleted

## 2019-05-05 ENCOUNTER — Other Ambulatory Visit: Payer: Self-pay | Admitting: Family Medicine

## 2019-05-05 ENCOUNTER — Encounter: Payer: Self-pay | Admitting: Family Medicine

## 2019-05-05 MED ORDER — OXYCODONE-ACETAMINOPHEN 5-325 MG PO TABS: 1.0000 | ORAL_TABLET | ORAL | 0 refills | Status: AC | PRN

## 2019-05-05 NOTE — Telephone Encounter (Signed)
Please touch base with patient Please explain authenticator situation with her If she would like a printed prescription that she will take to [facility] this is a temporary measure for a limited number of tablets-20  Hopefully authenticator will be corrected in the near future if she would rather wait but it could be Friday or Monday  Touch base with patient then let us know thank you

## 2019-05-05 NOTE — Telephone Encounter (Signed)
Discussed with pt. Pt wants to pick up rx. Will leave at front window.

## 2019-05-17 ENCOUNTER — Encounter: Payer: Self-pay | Admitting: Family Medicine

## 2019-05-17 MED ORDER — GABAPENTIN 100 MG PO CAPS: ORAL_CAPSULE | ORAL | 2 refills | Status: DC

## 2019-05-17 NOTE — Addendum Note (Signed)
Addended by: Vicente Males on: 05/17/2019 04:33 PM   Modules accepted: Orders

## 2019-05-17 NOTE — Telephone Encounter (Signed)
1.  May try gabapentin but I recommend starting at a low dose 100 mg, 1 3 times daily, #90, caution drowsiness, 2 refills  #2 I referred the patient to neurosurgery as she seen neurosurgery yet?  Have they given her an appointment yet?  If they have not please make sure someone from this office please assist with getting this done- the patient's condition is now at the point that a specialist is necessary to help her with this  #3 I recommend the patient to do a follow-up virtual visit within 3 weeks regarding the gabapentin

## 2019-06-28 ENCOUNTER — Other Ambulatory Visit: Payer: Self-pay | Admitting: Family Medicine

## 2019-06-28 NOTE — Telephone Encounter (Signed)
May have 90-day each

## 2019-11-04 ENCOUNTER — Other Ambulatory Visit: Payer: Self-pay | Admitting: Family Medicine

## 2020-03-22 ENCOUNTER — Other Ambulatory Visit: Payer: Self-pay | Admitting: Family Medicine

## 2020-03-23 NOTE — Telephone Encounter (Signed)
lvm to schedule appt.  

## 2020-03-26 NOTE — Telephone Encounter (Signed)
lvm to schedule appt.  

## 2020-03-27 NOTE — Telephone Encounter (Signed)
lvm to schedule appt.  

## 2020-03-30 ENCOUNTER — Other Ambulatory Visit: Payer: Self-pay | Admitting: *Deleted

## 2020-03-30 DIAGNOSIS — Z13220 Encounter for screening for lipoid disorders: Secondary | ICD-10-CM

## 2020-03-30 DIAGNOSIS — Z79899 Other long term (current) drug therapy: Secondary | ICD-10-CM

## 2020-03-30 DIAGNOSIS — R030 Elevated blood-pressure reading, without diagnosis of hypertension: Secondary | ICD-10-CM

## 2020-03-30 NOTE — Telephone Encounter (Signed)
Metabolic 7, lipid, liver, may have 1 refill, follow-up office visit recommended

## 2020-03-30 NOTE — Telephone Encounter (Signed)
No answer form patient -- ordered labs and sent 1 refill with note to call to schedule appointment before anymore refills.

## 2021-03-30 ENCOUNTER — Emergency Department (HOSPITAL_COMMUNITY): Payer: Self-pay

## 2021-03-30 ENCOUNTER — Encounter (HOSPITAL_COMMUNITY): Payer: Self-pay | Admitting: *Deleted

## 2021-03-30 ENCOUNTER — Inpatient Hospital Stay (HOSPITAL_COMMUNITY)
Admission: EM | Admit: 2021-03-30 | Discharge: 2021-04-02 | DRG: 872 | Disposition: A | Discharge status: Home / Self Care | Payer: Self-pay | Attending: Internal Medicine | Admitting: Internal Medicine

## 2021-03-30 ENCOUNTER — Other Ambulatory Visit: Payer: Self-pay

## 2021-03-30 DIAGNOSIS — Z20822 Contact with and (suspected) exposure to covid-19: Secondary | ICD-10-CM | POA: Diagnosis present

## 2021-03-30 DIAGNOSIS — A4151 Sepsis due to Escherichia coli [E. coli]: Principal | ICD-10-CM | POA: Diagnosis present

## 2021-03-30 DIAGNOSIS — B9620 Unspecified Escherichia coli [E. coli] as the cause of diseases classified elsewhere: Secondary | ICD-10-CM | POA: Diagnosis present

## 2021-03-30 DIAGNOSIS — N179 Acute kidney failure, unspecified: Secondary | ICD-10-CM | POA: Diagnosis present

## 2021-03-30 DIAGNOSIS — Z79899 Other long term (current) drug therapy: Secondary | ICD-10-CM

## 2021-03-30 DIAGNOSIS — N1831 Chronic kidney disease, stage 3a: Secondary | ICD-10-CM | POA: Diagnosis present

## 2021-03-30 DIAGNOSIS — R6510 Systemic inflammatory response syndrome (SIRS) of non-infectious origin without acute organ dysfunction: Secondary | ICD-10-CM

## 2021-03-30 DIAGNOSIS — F32A Depression, unspecified: Secondary | ICD-10-CM | POA: Diagnosis present

## 2021-03-30 DIAGNOSIS — Z6830 Body mass index (BMI) 30.0-30.9, adult: Secondary | ICD-10-CM

## 2021-03-30 DIAGNOSIS — G894 Chronic pain syndrome: Secondary | ICD-10-CM | POA: Diagnosis present

## 2021-03-30 DIAGNOSIS — F17210 Nicotine dependence, cigarettes, uncomplicated: Secondary | ICD-10-CM | POA: Diagnosis present

## 2021-03-30 DIAGNOSIS — N390 Urinary tract infection, site not specified: Secondary | ICD-10-CM | POA: Diagnosis present

## 2021-03-30 DIAGNOSIS — E039 Hypothyroidism, unspecified: Secondary | ICD-10-CM | POA: Diagnosis present

## 2021-03-30 DIAGNOSIS — A419 Sepsis, unspecified organism: Secondary | ICD-10-CM | POA: Diagnosis present

## 2021-03-30 DIAGNOSIS — N3001 Acute cystitis with hematuria: Secondary | ICD-10-CM

## 2021-03-30 DIAGNOSIS — M797 Fibromyalgia: Secondary | ICD-10-CM | POA: Diagnosis present

## 2021-03-30 DIAGNOSIS — Z79890 Hormone replacement therapy (postmenopausal): Secondary | ICD-10-CM

## 2021-03-30 DIAGNOSIS — E669 Obesity, unspecified: Secondary | ICD-10-CM | POA: Diagnosis present

## 2021-03-30 DIAGNOSIS — K5900 Constipation, unspecified: Secondary | ICD-10-CM | POA: Diagnosis present

## 2021-03-30 DIAGNOSIS — F4001 Agoraphobia with panic disorder: Secondary | ICD-10-CM | POA: Diagnosis present

## 2021-03-30 DIAGNOSIS — Z8249 Family history of ischemic heart disease and other diseases of the circulatory system: Secondary | ICD-10-CM

## 2021-03-30 HISTORY — DX: Acute kidney failure, unspecified: N17.9

## 2021-03-30 HISTORY — DX: Systemic inflammatory response syndrome (sirs) of non-infectious origin without acute organ dysfunction: R65.10

## 2021-03-30 LAB — CBC WITH DIFFERENTIAL/PLATELET
Abs Immature Granulocytes: 0.18 10*3/uL — ABNORMAL HIGH (ref 0.00–0.07)
Basophils Absolute: 0.1 10*3/uL (ref 0.0–0.1)
Basophils Relative: 1 %
Eosinophils Absolute: 0 10*3/uL (ref 0.0–0.5)
Eosinophils Relative: 0 %
HCT: 46.7 % — ABNORMAL HIGH (ref 36.0–46.0)
Hemoglobin: 15.1 g/dL — ABNORMAL HIGH (ref 12.0–15.0)
Immature Granulocytes: 1 %
Lymphocytes Relative: 8 %
Lymphs Abs: 1.7 10*3/uL (ref 0.7–4.0)
MCH: 28.7 pg (ref 26.0–34.0)
MCHC: 32.3 g/dL (ref 30.0–36.0)
MCV: 88.8 fL (ref 80.0–100.0)
Monocytes Absolute: 1.7 10*3/uL — ABNORMAL HIGH (ref 0.1–1.0)
Monocytes Relative: 9 %
Neutro Abs: 16.1 10*3/uL — ABNORMAL HIGH (ref 1.7–7.7)
Neutrophils Relative %: 81 %
Platelets: 198 10*3/uL (ref 150–400)
RBC: 5.26 MIL/uL — ABNORMAL HIGH (ref 3.87–5.11)
RDW: 14.4 % (ref 11.5–15.5)
WBC: 19.7 10*3/uL — ABNORMAL HIGH (ref 4.0–10.5)
nRBC: 0 % (ref 0.0–0.2)

## 2021-03-30 LAB — URINALYSIS, ROUTINE W REFLEX MICROSCOPIC
Bilirubin Urine: NEGATIVE
Glucose, UA: NEGATIVE mg/dL
Ketones, ur: NEGATIVE mg/dL
Leukocytes,Ua: NEGATIVE
Nitrite: POSITIVE — AB
Protein, ur: 100 mg/dL — AB
Specific Gravity, Urine: 1.019 (ref 1.005–1.030)
WBC, UA: >50 WBC/hpf — ABNORMAL HIGH (ref 0–5)
pH: 5 (ref 5.0–8.0)

## 2021-03-30 LAB — COMPREHENSIVE METABOLIC PANEL
ALT: 18 U/L (ref 0–44)
AST: 17 U/L (ref 15–41)
Albumin: 3.6 g/dL (ref 3.5–5.0)
Alkaline Phosphatase: 73 U/L (ref 38–126)
Anion gap: 8 (ref 5–15)
BUN: 18 mg/dL (ref 6–20)
CO2: 27 mmol/L (ref 22–32)
Calcium: 8.6 mg/dL — ABNORMAL LOW (ref 8.9–10.3)
Chloride: 100 mmol/L (ref 98–111)
Creatinine, Ser: 1.44 mg/dL — ABNORMAL HIGH (ref 0.44–1.00)
GFR, Estimated: 42 mL/min — ABNORMAL LOW (ref >60)
Glucose, Bld: 136 mg/dL — ABNORMAL HIGH (ref 70–99)
Potassium: 3.6 mmol/L (ref 3.5–5.1)
Sodium: 135 mmol/L (ref 135–145)
Total Bilirubin: 0.9 mg/dL (ref 0.3–1.2)
Total Protein: 7.8 g/dL (ref 6.5–8.1)

## 2021-03-30 LAB — HIV ANTIBODY (ROUTINE TESTING W REFLEX): HIV Screen 4th Generation wRfx: NONREACTIVE

## 2021-03-30 LAB — RESP PANEL BY RT-PCR (FLU A&B, COVID) ARPGX2
Influenza A by PCR: NEGATIVE
Influenza B by PCR: NEGATIVE
SARS Coronavirus 2 by RT PCR: NEGATIVE

## 2021-03-30 LAB — PROTIME-INR
INR: 1.1 (ref 0.8–1.2)
Prothrombin Time: 14.4 seconds (ref 11.4–15.2)

## 2021-03-30 LAB — LACTIC ACID, PLASMA: Lactic Acid, Venous: 1.5 mmol/L (ref 0.5–1.9)

## 2021-03-30 LAB — APTT: aPTT: 30 s (ref 24–36)

## 2021-03-30 MED ORDER — GABAPENTIN 300 MG PO CAPS
300.0000 mg | ORAL_CAPSULE | Freq: Three times a day (TID) | ORAL | Status: DC
  Administered 2021-04-01 – 2021-04-02 (×5): 300 mg via ORAL
  Filled 2021-03-30 (×7): qty 1

## 2021-03-30 MED ORDER — ACETAMINOPHEN 325 MG PO TABS
650.0000 mg | ORAL_TABLET | Freq: Once | ORAL | Status: AC
  Administered 2021-03-30: 650 mg via ORAL
  Filled 2021-03-30: qty 2

## 2021-03-30 MED ORDER — SODIUM CHLORIDE 0.9 % IV SOLN
250.0000 mL | INTRAVENOUS | Status: DC | PRN
  Administered 2021-03-30: 250 mL via INTRAVENOUS

## 2021-03-30 MED ORDER — ACETAMINOPHEN 650 MG RE SUPP: 650.0000 mg | Freq: Four times a day (QID) | RECTAL | Status: DC | PRN

## 2021-03-30 MED ORDER — FENTANYL CITRATE (PF) 100 MCG/2ML IJ SOLN
INTRAMUSCULAR | Status: AC
  Administered 2021-03-30: 25 ug via INTRAVENOUS
  Filled 2021-03-30: qty 2

## 2021-03-30 MED ORDER — LACTATED RINGERS IV BOLUS (SEPSIS)
1000.0000 mL | Freq: Once | INTRAVENOUS | Status: AC
  Administered 2021-03-30: 1000 mL via INTRAVENOUS

## 2021-03-30 MED ORDER — SODIUM CHLORIDE 0.9% FLUSH
3.0000 mL | Freq: Two times a day (BID) | INTRAVENOUS | Status: DC
  Administered 2021-03-30 – 2021-03-31 (×2): 3 mL via INTRAVENOUS

## 2021-03-30 MED ORDER — SODIUM CHLORIDE 0.9 % IV SOLN: INTRAVENOUS | Status: DC

## 2021-03-30 MED ORDER — ONDANSETRON HCL 4 MG/2ML IJ SOLN
4.0000 mg | Freq: Four times a day (QID) | INTRAMUSCULAR | Status: DC | PRN
  Administered 2021-03-30 – 2021-04-02 (×3): 4 mg via INTRAVENOUS
  Filled 2021-03-30 (×5): qty 2

## 2021-03-30 MED ORDER — TRAZODONE HCL 50 MG PO TABS
50.0000 mg | ORAL_TABLET | Freq: Every evening | ORAL | Status: DC | PRN
  Administered 2021-03-31 – 2021-04-01 (×2): 50 mg via ORAL
  Filled 2021-03-30 (×2): qty 1

## 2021-03-30 MED ORDER — BISACODYL 10 MG RE SUPP
10.0000 mg | Freq: Every day | RECTAL | Status: DC | PRN
  Administered 2021-04-01: 10 mg via RECTAL
  Filled 2021-03-30 (×2): qty 1

## 2021-03-30 MED ORDER — POLYETHYLENE GLYCOL 3350 17 G PO PACK
17.0000 g | PACK | Freq: Every day | ORAL | Status: DC | PRN
  Filled 2021-03-30: qty 1

## 2021-03-30 MED ORDER — SODIUM CHLORIDE 0.9% FLUSH: 3.0000 mL | INTRAVENOUS | Status: DC | PRN

## 2021-03-30 MED ORDER — OXYCODONE HCL 5 MG PO TABS
5.0000 mg | ORAL_TABLET | Freq: Three times a day (TID) | ORAL | Status: DC | PRN
  Administered 2021-03-30 – 2021-04-01 (×4): 5 mg via ORAL
  Filled 2021-03-30 (×4): qty 1

## 2021-03-30 MED ORDER — FENTANYL CITRATE (PF) 100 MCG/2ML IJ SOLN
25.0000 ug | Freq: Once | INTRAMUSCULAR | Status: AC
Start: 2021-03-30 — End: 2021-03-30

## 2021-03-30 MED ORDER — SODIUM CHLORIDE 0.9 % IV SOLN
1.0000 g | INTRAVENOUS | Status: DC
  Administered 2021-03-30 – 2021-03-31 (×2): 1 g via INTRAVENOUS
  Filled 2021-03-30 (×2): qty 10

## 2021-03-30 MED ORDER — ACETAMINOPHEN 325 MG PO TABS
650.0000 mg | ORAL_TABLET | Freq: Four times a day (QID) | ORAL | Status: DC | PRN
  Administered 2021-03-31 – 2021-04-02 (×6): 650 mg via ORAL
  Filled 2021-03-30 (×6): qty 2

## 2021-03-30 MED ORDER — SODIUM CHLORIDE 0.9% FLUSH
3.0000 mL | Freq: Two times a day (BID) | INTRAVENOUS | Status: DC
  Administered 2021-03-31: 3 mL via INTRAVENOUS

## 2021-03-30 MED ORDER — ONDANSETRON HCL 4 MG/2ML IJ SOLN
4.0000 mg | Freq: Once | INTRAMUSCULAR | Status: AC
  Administered 2021-03-30: 4 mg via INTRAVENOUS
  Filled 2021-03-30: qty 2

## 2021-03-30 MED ORDER — ONDANSETRON HCL 4 MG PO TABS
4.0000 mg | ORAL_TABLET | Freq: Four times a day (QID) | ORAL | Status: DC | PRN
  Administered 2021-04-01 – 2021-04-02 (×3): 4 mg via ORAL
  Filled 2021-03-30 (×3): qty 1

## 2021-03-30 MED ORDER — HEPARIN SODIUM (PORCINE) 5000 UNIT/ML IJ SOLN
5000.0000 [IU] | Freq: Three times a day (TID) | INTRAMUSCULAR | Status: DC
  Administered 2021-03-30 – 2021-04-02 (×9): 5000 [IU] via SUBCUTANEOUS
  Filled 2021-03-30 (×9): qty 1

## 2021-03-30 NOTE — ED Provider Notes (Signed)
[facility] EMERGENCY DEPARTMENT Provider Note   CSN: [id number] Arrival date & time: 03/30/21  1420     History Chief Complaint  Patient presents with   Abdominal Pain    Daisy [name] is a 60 y.o. female.  HPI Patient is a 60 year old female with a medical history as noted below.  She presents to the emergency department due to dysuria.  Symptoms started about 3 days ago.  She reports associated lower abdominal pain, flank pain, fevers, nausea, vomiting.  Denies any vaginal discharge.  Denies a history of frequent UTIs.  She states that her nausea/vomiting has been intractable and has been unable to tolerate any p.o. intake since her symptoms started.  She has taken a home COVID-19 test which was negative.  Patient states she took a dose of Azo this morning.    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     Family History  Problem Relation Age of Onset   Heart disease Father     Social History    Tobacco Use   Smoking status: Every Day    Packs/day: 1.00    Pack years: 0.00    Types: Cigarettes   Smokeless tobacco: Never  Substance Use Topics   Alcohol use: No   Drug use: Yes    Types: Marijuana    Home Medications Prior to Admission medications   Medication Sig Start Date End Date Taking? Authorizing Provider  acetaminophen (TYLENOL) 650 MG CR tablet Take 1,300 mg by mouth every 8 (eight) hours as needed for pain.   Yes [provider]  amoxicillin (AMOXIL) 500 MG capsule Take 1 capsule (500 mg total) by mouth 3 (three) times daily. Patient not taking: Reported on 03/30/2021 04/21/19   Kathyrn Drown, MD  amoxicillin-clavulanate (AUGMENTIN) 875-125 MG tablet Take one bid for 10 days with food. Patient not taking: Reported on 03/30/2021 12/31/18   Mikey Kirschner, MD  citalopram (CELEXA) 20 MG tablet TAKE 1 TABLET BY MOUTH ONCE DAILY **PATIENT  NEEDS  VIRTUAL  VISIT  IN  SUMMER** Patient not taking: Reported on 03/30/2021 06/28/19   Kathyrn Drown, MD  cyclobenzaprine (FLEXERIL) 10 MG tablet Take 10 mg by mouth at bedtime. Patient not taking: Reported on 03/30/2021    [provider]  EUTHYROX 50 MCG tablet  Take 1 tablet by mouth once daily Patient not taking: Reported on 03/30/2021 06/28/19   Kathyrn Drown, MD  gabapentin (NEURONTIN) 100 MG capsule TAKE 1 CAPSULE BY MOUTH THREE TIMES DAILY MAY CAUSE DROWSINESS Patient not taking: Reported on 03/30/2021 03/30/20   Kathyrn Drown, MD  indomethacin (INDOCIN) 25 MG capsule Take 50 mg by mouth 3 (three) times daily as needed for mild pain or moderate pain. Patient not taking: Reported on 03/30/2021    [provider]  naproxen (NAPROSYN) 500 MG tablet TAKE 1 TABLET BY  MOUTH TWICE DAILY AS NEEDED FOR HEADACHES, NOT FOR FREQUENT USE. Patient not taking: Reported on 03/30/2021 02/07/19   Kathyrn Drown, MD  topiramate (TOPAMAX) 100 MG tablet Take one tablet by mouth twice daily Patient not taking: Reported  on 03/30/2021 01/06/19   Kathyrn Drown, MD    Allergies    Patient has no known allergies.  Review of Systems   Review of Systems  All other systems reviewed and are negative. Ten systems reviewed and are negative for acute change, except as noted in the HPI.   Physical Exam Updated Vital Signs BP 134/70 (BP Location: Right Arm)   Pulse (!) 114   Temp (!) 103.5 F (39.7 C) (Rectal)   Resp 20   Ht 5\' 4"  (1.626 m)   Wt 81.6 kg   SpO2 95%   BMI 30.90 kg/m   Physical Exam Vitals and nursing note reviewed.  Constitutional:      General: She is in acute distress.     Appearance: Normal appearance. She is well-developed. She is ill-appearing. She is not toxic-appearing or diaphoretic.  HENT:     Head: Normocephalic and atraumatic.     Right Ear: External ear normal.     Left Ear: External ear normal.     Nose: Nose normal.     Mouth/Throat:     Mouth: Mucous membranes are moist.     Pharynx: Oropharynx is clear. No oropharyngeal exudate or posterior oropharyngeal erythema.  Eyes:     Extraocular Movements: Extraocular movements intact.  Cardiovascular:     Rate and Rhythm: Regular rhythm. Tachycardia present.     Pulses: Normal pulses.     Heart sounds: Normal heart sounds. No murmur heard.   No friction rub. No gallop.  Pulmonary:     Effort: Pulmonary effort is normal. No respiratory distress.     Breath sounds: Normal breath sounds. No stridor. No wheezing, rhonchi or rales.  Abdominal:     General: Abdomen is flat.     Palpations: Abdomen is soft.     Tenderness: There is abdominal tenderness in the suprapubic area. There is right CVA tenderness. There is no left CVA tenderness. Negative signs include Murphy's sign and McBurney's sign.  Musculoskeletal:        General: Normal range of motion.     Cervical back: Normal range of motion and neck supple. No tenderness.  Skin:    General: Skin is warm and dry.  Neurological:     General: No focal deficit present.      Mental Status: She is alert and oriented to person, place, and time.  Psychiatric:        Mood and Affect: Mood normal.        Behavior: Behavior normal.   ED Results / Procedures / Treatments   Labs (all labs ordered are listed, but only abnormal results are displayed) Labs Reviewed  COMPREHENSIVE METABOLIC PANEL - Abnormal;  Notable for the following components:      Result Value   Glucose, Bld 136 (*)    Creatinine, Ser 1.44 (*)    Calcium 8.6 (*)    GFR, Estimated 42 (*)    All other components within normal limits  CBC WITH DIFFERENTIAL/PLATELET - Abnormal; Notable for the following components:   WBC 19.7 (*)    RBC 5.26 (*)    Hemoglobin 15.1 (*)    HCT 46.7 (*)    Neutro Abs 16.1 (*)    Monocytes Absolute 1.7 (*)    Abs Immature Granulocytes 0.18 (*)    All other components within normal limits  URINALYSIS, ROUTINE W REFLEX MICROSCOPIC - Abnormal; Notable for the following components:   Color, Urine AMBER (*)    APPearance HAZY (*)    Hgb urine dipstick LARGE (*)    Protein, ur 100 (*)    Nitrite POSITIVE (*)    WBC, UA >50 (*)    Bacteria, UA FEW (*)    Non Squamous Epithelial 0-5 (*)    All other components within normal limits  CULTURE, BLOOD (ROUTINE X 2)  CULTURE, BLOOD (ROUTINE X 2)  RESP PANEL BY RT-PCR (FLU A&B, COVID) ARPGX2  URINE CULTURE  LACTIC ACID, PLASMA  PROTIME-INR  APTT  LACTIC ACID, PLASMA    EKG None  Radiology DG Chest Port 1 View  Result Date: 03/30/2021 CLINICAL DATA:  Possible sepsis. EXAM: PORTABLE CHEST 1 VIEW COMPARISON:  Chest x-ray dated October 29, 2017. FINDINGS: The heart size and mediastinal contours are within normal limits. Both lungs are clear. The visualized skeletal structures are unremarkable. IMPRESSION: No active disease. Electronically Signed   By: Titus Dubin M.D.   On: 03/30/2021 15:48    Procedures .Critical Care  Date/Time: 03/30/2021 4:53 PM Performed by: Rayna Sexton, PA-C Authorized by: Rayna Sexton, PA-C   Critical care provider statement:    Critical care time (minutes):  30   Critical care was necessary to treat or prevent imminent or life-threatening deterioration of the following conditions:  Sepsis   Critical care was time spent personally by me on the following activities:  Discussions with consultants, evaluation of patient's response to treatment, examination of patient, ordering and performing treatments and interventions, ordering and review of laboratory studies, ordering and review of radiographic studies, pulse oximetry, re-evaluation of patient's condition, obtaining history from patient or surrogate and review of old charts   Medications Ordered in ED Medications  lactated ringers bolus 1,000 mL (1,000 mLs Intravenous New Bag/Given 03/30/21 1642)  cefTRIAXone (ROCEPHIN) 1 g in sodium chloride 0.9 % 100 mL IVPB (1 g Intravenous New Bag/Given 03/30/21 1635)  lactated ringers bolus 1,000 mL (0 mLs Intravenous Stopped 03/30/21 1642)  acetaminophen (TYLENOL) tablet 650 mg (650 mg Oral Given 03/30/21 1554)  ondansetron (ZOFRAN) injection 4 mg (4 mg Intravenous Given 03/30/21 1543)    ED Course  I have reviewed the triage vital signs and the nursing notes.  Pertinent labs & imaging results that were available during my care of the patient were reviewed by me and considered in my medical decision making (see chart for details).  Clinical Course as of 03/30/21 1720  Sat Mar 30, 2021  1533 WBC(!): 19.7 [LJ]  1614 NEUT#(!): 16.1 [LJ]  1618 Rectal temperature of 103.5 F with a tachycardia of 125.  Significant leukocytosis and neutrophilia.  UA pending.  Given patient's clinical presentation this is likely sepsis secondary to pyelonephritis.  Code sepsis initiated.  Remaining  fluids given based on adjusted body weight.  We will give IV Rocephin.  We will closely monitor and likely admit. [LJ]  1619 Temp(!): 103.5 F (39.7 C) [LJ]  1619 Pulse Rate(!): 125 [LJ]  1619 Nitrite(!):  POSITIVE [LJ]  1641 WBC, UA(!): >50 [LJ]  1641 Bacteria, UA(!): FEW [LJ]  1641 Lactic Acid, Venous: 1.5 [LJ]    Clinical Course User Index [LJ] Rayna Sexton, PA-C   MDM Rules/Calculators/A&P                          Pt is a 60 y.o. female who presents to the emergency department with what appears to be sepsis 2/2 pyelonephritis.  Labs: CBC with leukocytosis of 19.7, hemoglobin of 15.1, neutrophils of 16.1, monocytes 1.7, absolute immature granulocytes of 0.18. UA showing large hemoglobin, 100 protein, nitrate positive, 21-50 RBCs, greater than 50 white blood cells, few bacteria. PT/INR within normal limits. APTT within normal limits. Lactic acid of 1.5. CMP with a glucose of 136, creatinine of 1.44, calcium of 8.6, GFR 42. Respiratory panel is negative. Blood cultures obtained.  Imaging: Chest x-ray is negative.  I, Rayna Sexton, PA-C, personally reviewed and evaluated these images and lab results as part of my medical decision-making.  Physical exam significant for suprapubic pain as well as right flank pain.  Patient tachycardic and febrile.  Code sepsis initiated.  Patient fluid bolused based on adjusted body weight.  Given IV Rocephin as well as APAP for fever/pain.  Was offered additional pain meds but declined.  Tachycardia improving.  Currently around 115 bpm.  No episodes of hypotension since arrival with a reassuring lactic acid.  Does not meet criteria for septic shock.    Feel that patient would benefit from admission for further antibiotics and management.  She is amenable with this plan.  Will discuss with medicine.  Note: Portions of this report may have been transcribed using voice recognition software. Every effort was made to ensure accuracy; however, inadvertent computerized transcription errors may be present.   Final Clinical Impression(s) / ED Diagnoses Final diagnoses:  Sepsis, due to unspecified organism, unspecified whether acute organ dysfunction  present [facility])  Acute cystitis with hematuria   Rx / DC Orders ED Discharge Orders     None        Rayna Sexton, PA-C 03/30/21 [place], MD 03/31/21 1905

## 2021-03-30 NOTE — ED Triage Notes (Signed)
Low abdominal pain with dysuria.

## 2021-03-30 NOTE — H&P (Signed)
Patient Demographics:    Daisy [name], is a 60 y.o. female  MRN: [id number]   DOB - January 04, 1961  Admit Date - 03/30/2021  Outpatient Primary MD for the patient is Kathyrn Drown, MD   Assessment & Plan:    Principal Problem:   Sepsis due to UTI Active Problems:   Suspect Acute lower UTI   Hypothyroidism   AKI (acute kidney injury) ([place])   Depression   Chronic pain syndrome   1)Sepsis secondary to "honeymoon" cystitis/pyelonephritis----IV Rocephin and IV fluids pending blood and urine culture data -Patient met sepsis criteria on admission with fever of 103.5, tachycardia with heart rate up to 130, tachypnea with respiratory rate up to 24 leukocytosis with -WBC 19.7, UA suggestive of UTI -Lactic acid 1.5  2)AKI----acute kidney injury creatinine 1.44 from a baseline between 0.8-1.0 -suspect this is due to dehydration and UTI  -renally adjust medications, avoid nephrotoxic agents / dehydration  / hypotension  3)Social/Ethics--- patient got married on March 16, 2021, plan of care discussed with patient and husband at bedside, patient is a full code  4)Depression and chronic pain syndrome--- okay to give gabapentin, be judicious with opiates  5)H/o hypothyroidism--- PTA patient was not on medications, check TSH in a.m.  Disposition/Need for in-Hospital Stay- patient unable to be discharged at this time due to --sepsis secondary to presumed UTI/pyelonephritis requiring IV antibiotics and IV fluids*  Status is: Inpatient  Remains inpatient appropriate because: Please see disposition above  Dispo: The patient is from: Home              Anticipated d/c is to: Home              Anticipated d/c date is: 2 days              Patient currently is not medically stable to d/c. Barriers: Not Clinically Stable-    With History of - Reviewed by me  Past Medical History:  Diagnosis Date   Chronic pain syndrome    Depression    Fatigue    Fibromyalgia    Normal cardiac stress test 2002   Thyroid disease       Past Surgical History:  Procedure Laterality Date   APPENDECTOMY     CESAREAN SECTION     twice '84 and '89   [place]  2004 and 2005   twice  same site after cholecystectomy   NASAL SINUS SURGERY     twice -enlarged sinuses and cleared scar tissue   TONSILLECTOMY      Chief Complaint  Patient presents with   Abdominal Pain      HPI:    Daisy [name]  is a 60 y.o. female with past medical history relevant for thyroid disorder, chronic pain syndrome and depression who recently got married on March 16, 2021 presents with  complaints of dysuria urinary frequency and urgency since 03/26/2021, developed flank pain on 03/30/2021 -Has nausea but no emesis --Patient met sepsis criteria on admission with fever of 103.5, tachycardia with heart rate up to 130, tachypnea with respiratory rate up to 24 leukocytosis with -WBC 19.7, UA suggestive of UTI -Lactic acid 1.5 -Creatinine jumped to 1.44 from a baseline between 0.8 and 1.0 -No history obtained from patient's husband at bedside -Chest X-ray without acute cardiopulmonary finding    Review of systems:    In addition to the HPI above,   A full Review of  Systems was done, all other systems reviewed are negative except as noted above in HPI , .    Social History:  Reviewed by me    Social History   Tobacco Use   Smoking status: Every Day    Packs/day: 1.00    Pack years: 0.00    Types: Cigarettes   Smokeless tobacco: Never  Substance Use Topics   Alcohol use: No     Family History :  Reviewed by me    Family History  Problem Relation Age of Onset   Heart disease Father      Home Medications:   Prior to Admission medications    Medication Sig Start Date End Date Taking? Authorizing Provider  acetaminophen (TYLENOL) 650 MG CR tablet Take 1,300 mg by mouth every 8 (eight) hours as needed for pain.   Yes [provider]  amoxicillin (AMOXIL) 500 MG capsule Take 1 capsule (500 mg total) by mouth 3 (three) times daily. Patient not taking: Reported on 03/30/2021 04/21/19   Kathyrn Drown, MD  amoxicillin-clavulanate (AUGMENTIN) 875-125 MG tablet Take one bid for 10 days with food. Patient not taking: Reported on 03/30/2021 12/31/18   Mikey Kirschner, MD  citalopram (CELEXA) 20 MG tablet TAKE 1 TABLET BY MOUTH ONCE DAILY **PATIENT  NEEDS  VIRTUAL  VISIT  IN  SUMMER** Patient not taking: Reported on 03/30/2021 06/28/19   Kathyrn Drown, MD  cyclobenzaprine (FLEXERIL) 10 MG tablet Take 10 mg by mouth at bedtime. Patient not taking: Reported on 03/30/2021    [provider]  EUTHYROX 50 MCG tablet Take 1 tablet by mouth once daily Patient not taking: Reported on 03/30/2021 06/28/19   Kathyrn Drown, MD  gabapentin (NEURONTIN) 100 MG capsule TAKE 1 CAPSULE BY MOUTH THREE TIMES DAILY MAY CAUSE DROWSINESS Patient not taking: Reported on 03/30/2021 03/30/20   Kathyrn Drown, MD  indomethacin (INDOCIN) 25 MG capsule Take 50 mg by mouth 3 (three) times daily as needed for mild pain or moderate pain. Patient not taking: Reported on 03/30/2021    [provider]  naproxen (NAPROSYN) 500 MG tablet TAKE 1 TABLET BY  MOUTH TWICE DAILY AS NEEDED FOR HEADACHES, NOT FOR FREQUENT USE. Patient not taking: Reported on 03/30/2021 02/07/19   Kathyrn Drown, MD  topiramate (TOPAMAX) 100 MG tablet Take one tablet by mouth twice daily Patient not taking: Reported on 03/30/2021 01/06/19   Kathyrn Drown, MD     Allergies:    No Known Allergies   Physical Exam:   Vitals  Blood pressure (!) 145/54, pulse (!) 107, temperature 99 F (37.2 C), temperature source Oral, resp. rate 20, height '5\' 4"'  (1.626 m), weight 81.6 kg, SpO2  97 %.  Physical Examination: General appearance - alert,  and in no distress Mental status - alert, oriented to person, place, and time,  Eyes - sclera anicteric Neck - supple,  no JVD elevation , Chest - clear  to auscultation bilaterally, symmetrical air movement,  Heart - S1 and S2 normal, regular  Abdomen - soft, nontender, nondistended, right-sided CVA tenderness Neurological - screening mental status exam normal, neck supple without rigidity, cranial nerves II through XII intact, DTR's normal and symmetric Extremities - no pedal edema noted, intact peripheral pulses  Skin - warm, dry     Data Review:    CBC Recent Labs  Lab 03/30/21 1521  WBC 19.7*  HGB 15.1*  HCT 46.7*  PLT 198  MCV 88.8  MCH 28.7  MCHC 32.3  RDW 14.4  LYMPHSABS 1.7  MONOABS 1.7*  EOSABS 0.0  BASOSABS 0.1   ------------------------------------------------------------------------------------------------------------------  Chemistries  Recent Labs  Lab 03/30/21 1521  NA 135  K 3.6  CL 100  CO2 27  GLUCOSE 136*  BUN 18  CREATININE 1.44*  CALCIUM 8.6*  AST 17  ALT 18  ALKPHOS 73  BILITOT 0.9   ------------------------------------------------------------------------------------------------------------------ estimated creatinine clearance is 43 mL/min (A) (by C-G formula based on SCr of 1.44 mg/dL (H)). ------------------------------------------------------------------------------------------------------------------ No results for input(s): TSH, T4TOTAL, T3FREE, THYROIDAB in the last 72 hours.  Invalid input(s): FREET3   Coagulation profile Recent Labs  Lab 03/30/21 1521  INR 1.1   ------------------------------------------------------------------------------------------------------------------- No results for input(s): DDIMER in the last 72 hours. -------------------------------------------------------------------------------------------------------------------  Cardiac  Enzymes No results for input(s): CKMB, TROPONINI, MYOGLOBIN in the last 168 hours.  Invalid input(s): CK ------------------------------------------------------------------------------------------------------------------ No results found for: BNP   Urinalysis    Component Value Date/Time   COLORURINE AMBER (A) 03/30/2021 1521   APPEARANCEUR HAZY (A) 03/30/2021 1521   LABSPEC 1.019 03/30/2021 1521   PHURINE 5.0 03/30/2021 1521   GLUCOSEU NEGATIVE 03/30/2021 1521   HGBUR LARGE (A) 03/30/2021 1521   BILIRUBINUR NEGATIVE 03/30/2021 1521   KETONESUR NEGATIVE 03/30/2021 1521   PROTEINUR 100 (A) 03/30/2021 1521   UROBILINOGEN 0.2 03/08/2015 2027   NITRITE POSITIVE (A) 03/30/2021 1521   LEUKOCYTESUR NEGATIVE 03/30/2021 1521    ----------------------------------------------------------------------------------------------------------------   Imaging Results:    DG Chest Port 1 View  Result Date: 03/30/2021 CLINICAL DATA:  Possible sepsis. EXAM: PORTABLE CHEST 1 VIEW COMPARISON:  Chest x-ray dated October 29, 2017. FINDINGS: The heart size and mediastinal contours are within normal limits. Both lungs are clear. The visualized skeletal structures are unremarkable. IMPRESSION: No active disease. Electronically Signed   By: Titus Dubin M.D.   On: 03/30/2021 15:48    Radiological Exams on Admission: DG Chest Port 1 View  Result Date: 03/30/2021 CLINICAL DATA:  Possible sepsis. EXAM: PORTABLE CHEST 1 VIEW COMPARISON:  Chest x-ray dated October 29, 2017. FINDINGS: The heart size and mediastinal contours are within normal limits. Both lungs are clear. The visualized skeletal structures are unremarkable. IMPRESSION: No active disease. Electronically Signed   By: Titus Dubin M.D.   On: 03/30/2021 15:48    DVT Prophylaxis -SCD/heparin AM Labs Ordered, also please review Full Orders  Family Communication: Admission, patients condition and plan of care including tests being ordered have been  discussed with the patient and husband who indicate understanding and agree with the plan   Code Status - Full Code  Likely DC to home in a couple of days after resolution of pyelonephritis/UTI  Condition   stable  Roxan Hockey M.D on 03/30/2021 at 10:13 PM Go to [email or web address] -  for contact info  [facility] - Office  [phone]

## 2021-03-30 NOTE — Progress Notes (Signed)
Elink is following this code sepsis ?

## 2021-03-31 DIAGNOSIS — E669 Obesity, unspecified: Secondary | ICD-10-CM | POA: Diagnosis present

## 2021-03-31 LAB — BASIC METABOLIC PANEL
Anion gap: 8 (ref 5–15)
BUN: 16 mg/dL (ref 6–20)
CO2: 25 mmol/L (ref 22–32)
Calcium: 7.6 mg/dL — ABNORMAL LOW (ref 8.9–10.3)
Chloride: 103 mmol/L (ref 98–111)
Creatinine, Ser: 1.44 mg/dL — ABNORMAL HIGH (ref 0.44–1.00)
GFR, Estimated: 42 mL/min — ABNORMAL LOW (ref >60)
Glucose, Bld: 119 mg/dL — ABNORMAL HIGH (ref 70–99)
Potassium: 3.5 mmol/L (ref 3.5–5.1)
Sodium: 136 mmol/L (ref 135–145)

## 2021-03-31 LAB — CBC
HCT: 38.6 % (ref 36.0–46.0)
Hemoglobin: 12.2 g/dL (ref 12.0–15.0)
MCH: 28.6 pg (ref 26.0–34.0)
MCHC: 31.6 g/dL (ref 30.0–36.0)
MCV: 90.4 fL (ref 80.0–100.0)
Platelets: 152 10*3/uL (ref 150–400)
RBC: 4.27 MIL/uL (ref 3.87–5.11)
RDW: 14.5 % (ref 11.5–15.5)
WBC: 13.3 10*3/uL — ABNORMAL HIGH (ref 4.0–10.5)
nRBC: 0 % (ref 0.0–0.2)

## 2021-03-31 LAB — TSH: TSH: 6.868 u[IU]/mL — ABNORMAL HIGH (ref 0.350–4.500)

## 2021-03-31 NOTE — Progress Notes (Signed)
MD made aware via Amion chat. Awaiting order. Ice and cool washcloth applied.

## 2021-03-31 NOTE — Progress Notes (Signed)
PROGRESS NOTE  Daisy [name] ULA:[id number] DOB: November 10, 1960 DOA: 03/30/2021 PCP: Kathyrn Drown, MD  HPI/Recap of past 30 hours: 60 year old female with past medical history of hypothyroidism, obesity and depression presented to the emergency room on 6/25 with 1 day history of flank pain and 4 days of dysuria and increased urinary frequency.  In the emergency room, found to have sepsis secondary to UTI.  Admitted to the hospital service and started on IV fluids plus antibiotics.  Today, patient complains of hurting all over and some nausea.  She feels only minimally better than when she came in yesterday.  Assessment/Plan: Principal Problem:   Sepsis due to UTI: Patient met criteria for sepsis on admission secondary to urinary source given fever, tachycardia, tachypnea and white blood cell count.  She also had mild acute kidney injury.  Started on IV fluids.  Creatinine not yet improved.  White count down to 13.3.  Await urine culture.  Continue antibiotics.  Treat symptomatically Active Problems:   Depression: Continue home medications.    Hypothyroidism: Continue Synthroid.    Chronic pain syndrome    AKI (acute kidney injury) ([place]): Last creatinine was 1.092 years ago.  Continue IV fluids.    Obesity (BMI 30-39.9): Meets criteria BMI greater than 30   Code Status: Full code  Family Communication: Husband at the bedside  Disposition Plan: Anticipate discharge in the next few days once white blood cell count normalized and antibiotics changed over to p.o. and creatinine normalized   Consultants: None  Procedures: None  Antimicrobials: IV Rocephin 6/25-present  DVT prophylaxis: SCDs  Level of care: Telemetry   Objective: Vitals:   03/31/21 0210 03/31/21 0508  BP: 125/65 137/61  Pulse: 97 100  Resp: 20 20  Temp: 99.4 F (37.4 C) (!) 100.5 F (38.1 C)  SpO2: 90% 97%    Intake/Output Summary (Last 24 hours) at 03/31/2021 1042 Last data filed at 03/31/2021  0410 Gross per 24 hour  Intake 3770.2 ml  Output --  Net 3770.2 ml   Filed Weights   03/30/21 1508  Weight: 81.6 kg   Body mass index is 30.9 kg/m.  Exam:  General: Alert and oriented x3, mild distress secondary to not feeling well HEENT: Normocephalic and atraumatic, mucous membranes are slightly dry Cardiovascular: Regular rate and rhythm, borderline tachycardia Respiratory: Clear to auscultation bilaterally Abdomen: Soft, nontender, nondistended, positive bowel sounds Back: Bilateral flank tenderness, more so on the right Musculoskeletal: No clubbing or cyanosis or edema Skin: No skin breaks, tears or lesions Psychiatry: Appropriate, no evidence of psychoses Neurology: No focal deficits   Data Reviewed: CBC: Recent Labs  Lab 03/30/21 1521 03/31/21 0344  WBC 19.7* 13.3*  NEUTROABS 16.1*  --   HGB 15.1* 12.2  HCT 46.7* 38.6  MCV 88.8 90.4  PLT 198 212   Basic Metabolic Panel: Recent Labs  Lab 03/30/21 1521 03/31/21 0344  NA 135 136  K 3.6 3.5  CL 100 103  CO2 27 25  GLUCOSE 136* 119*  BUN 18 16  CREATININE 1.44* 1.44*  CALCIUM 8.6* 7.6*   GFR: Estimated Creatinine Clearance: 43 mL/min (A) (by C-G formula based on SCr of 1.44 mg/dL (H)). Liver Function Tests: Recent Labs  Lab 03/30/21 1521  AST 17  ALT 18  ALKPHOS 73  BILITOT 0.9  PROT 7.8  ALBUMIN 3.6   No results for input(s): LIPASE, AMYLASE in the last 168 hours. No results for input(s): AMMONIA in the last 168 hours. Coagulation Profile: Recent  Labs  Lab 03/30/21 1521  INR 1.1   Cardiac Enzymes: No results for input(s): CKTOTAL, CKMB, CKMBINDEX, TROPONINI in the last 168 hours. BNP (last 3 results) No results for input(s): PROBNP in the last 8760 hours. HbA1C: No results for input(s): HGBA1C in the last 72 hours. CBG: No results for input(s): GLUCAP in the last 168 hours. Lipid Profile: No results for input(s): CHOL, HDL, LDLCALC, TRIG, CHOLHDL, LDLDIRECT in the last 72  hours. Thyroid Function Tests: Recent Labs    03/31/21 0344  TSH 6.868*   Anemia Panel: No results for input(s): VITAMINB12, FOLATE, FERRITIN, TIBC, IRON, RETICCTPCT in the last 72 hours. Urine analysis:    Component Value Date/Time   COLORURINE AMBER (A) 03/30/2021 1521   APPEARANCEUR HAZY (A) 03/30/2021 1521   LABSPEC 1.019 03/30/2021 1521   PHURINE 5.0 03/30/2021 1521   GLUCOSEU NEGATIVE 03/30/2021 1521   HGBUR LARGE (A) 03/30/2021 1521   BILIRUBINUR NEGATIVE 03/30/2021 1521   KETONESUR NEGATIVE 03/30/2021 1521   PROTEINUR 100 (A) 03/30/2021 1521   UROBILINOGEN 0.2 03/08/2015 2027   NITRITE POSITIVE (A) 03/30/2021 1521   LEUKOCYTESUR NEGATIVE 03/30/2021 1521   Sepsis Labs: _0 (procalcitonin:4,lacticidven:4)  ) Recent Results (from the past 240 hour(s))  Resp Panel by RT-PCR (Flu A&B, Covid) Nasopharyngeal Swab     Status: None   Collection Time: 03/30/21  3:22 PM   Specimen: Nasopharyngeal Swab; Nasopharyngeal(NP) swabs in vial transport medium  Result Value Ref Range Status   SARS Coronavirus 2 by RT PCR NEGATIVE NEGATIVE Final    Comment: (NOTE) SARS-CoV-2 target nucleic acids are NOT DETECTED.  The SARS-CoV-2 RNA is generally detectable in upper respiratory specimens during the acute phase of infection. The lowest concentration of SARS-CoV-2 viral copies this assay can detect is 138 copies/mL. A negative result does not preclude SARS-Cov-2 infection and should not be used as the sole basis for treatment or other patient management decisions. A negative result may occur with  improper specimen collection/handling, submission of specimen other than nasopharyngeal swab, presence of viral mutation(s) within the areas targeted by this assay, and inadequate number of viral copies(<138 copies/mL). A negative result must be combined with clinical observations, patient history, and epidemiological information. The expected result is Negative.  Fact Sheet for  Patients:  EntrepreneurPulse.com.au  Fact Sheet for Healthcare Providers:  IncredibleEmployment.be  This test is no t yet approved or cleared by the [place] FDA and  has been authorized for detection and/or diagnosis of SARS-CoV-2 by FDA under an Emergency Use Authorization (EUA). This EUA will remain  in effect (meaning this test can be used) for the duration of the COVID-19 declaration under Section 564(b)(1) of the Act, 21 U.S.C.section 360bbb-3(b)(1), unless the authorization is terminated  or revoked sooner.       Influenza A by PCR NEGATIVE NEGATIVE Final   Influenza B by PCR NEGATIVE NEGATIVE Final    Comment: (NOTE) The Xpert Xpress SARS-CoV-2/FLU/RSV plus assay is intended as an aid in the diagnosis of influenza from Nasopharyngeal swab specimens and should not be used as a sole basis for treatment. Nasal washings and aspirates are unacceptable for Xpert Xpress SARS-CoV-2/FLU/RSV testing.  Fact Sheet for Patients: EntrepreneurPulse.com.au  Fact Sheet for Healthcare Providers: IncredibleEmployment.be  This test is not yet approved or cleared by the [place] FDA and has been authorized for detection and/or diagnosis of SARS-CoV-2 by FDA under an Emergency Use Authorization (EUA). This EUA will remain in effect (meaning this test can be used) for the duration  of the COVID-19 declaration under Section 564(b)(1) of the Act, 21 U.S.C. section 360bbb-3(b)(1), unless the authorization is terminated or revoked.  Performed at [facility], [address] [place], [place] [address]   Blood Culture (routine x 2)     Status: None (Preliminary result)   Collection Time: 03/30/21  4:25 PM   Specimen: Left Antecubital; Blood  Result Value Ref Range Status   Specimen Description   Final    LEFT ANTECUBITAL BOTTLES DRAWN AEROBIC AND ANAEROBIC   Special Requests   Final    Blood Culture adequate  volume Performed at [facility], [address], [place], [place] [address]    Culture PENDING  Incomplete   Report Status PENDING  Incomplete  Blood Culture (routine x 2)     Status: None (Preliminary result)   Collection Time: 03/30/21  4:27 PM   Specimen: BLOOD LEFT ARM  Result Value Ref Range Status   Specimen Description BLOOD LEFT ARM BOTTLES DRAWN AEROBIC AND ANAEROBIC  Final   Special Requests   Final    Blood Culture adequate volume Performed at [facility], [address], [place], [place] [address]    Culture PENDING  Incomplete   Report Status PENDING  Incomplete      Studies: DG Chest Port 1 View  Result Date: 03/30/2021 CLINICAL DATA:  Possible sepsis. EXAM: PORTABLE CHEST 1 VIEW COMPARISON:  Chest x-ray dated October 29, 2017. FINDINGS: The heart size and mediastinal contours are within normal limits. Both lungs are clear. The visualized skeletal structures are unremarkable. IMPRESSION: No active disease. Electronically Signed   By: Titus Dubin M.D.   On: 03/30/2021 15:48    Scheduled Meds:  gabapentin  300 mg Oral TID   heparin  5,000 Units Subcutaneous Q8H   sodium chloride flush  3 mL Intravenous Q12H   sodium chloride flush  3 mL Intravenous Q12H    Continuous Infusions:  sodium chloride 150 mL/hr at 03/30/21 1841   sodium chloride 250 mL (03/30/21 1839)   cefTRIAXone (ROCEPHIN)  IV Stopped (03/30/21 1726)     LOS: 1 day     Annita Brod, MD [facility]   03/31/2021, 10:42 AM

## 2021-04-01 LAB — CBC
HCT: 32.7 % — ABNORMAL LOW (ref 36.0–46.0)
Hemoglobin: 10.5 g/dL — ABNORMAL LOW (ref 12.0–15.0)
MCH: 28.5 pg (ref 26.0–34.0)
MCHC: 32.1 g/dL (ref 30.0–36.0)
MCV: 88.9 fL (ref 80.0–100.0)
Platelets: 144 10*3/uL — ABNORMAL LOW (ref 150–400)
RBC: 3.68 MIL/uL — ABNORMAL LOW (ref 3.87–5.11)
RDW: 14.4 % (ref 11.5–15.5)
WBC: 8.9 10*3/uL (ref 4.0–10.5)
nRBC: 0 % (ref 0.0–0.2)

## 2021-04-01 LAB — BASIC METABOLIC PANEL
Anion gap: 7 (ref 5–15)
BUN: 16 mg/dL (ref 6–20)
CO2: 21 mmol/L — ABNORMAL LOW (ref 22–32)
Calcium: 7.2 mg/dL — ABNORMAL LOW (ref 8.9–10.3)
Chloride: 107 mmol/L (ref 98–111)
Creatinine, Ser: 1.06 mg/dL — ABNORMAL HIGH (ref 0.44–1.00)
GFR, Estimated: >60 mL/min (ref >60)
Glucose, Bld: 104 mg/dL — ABNORMAL HIGH (ref 70–99)
Potassium: 3.4 mmol/L — ABNORMAL LOW (ref 3.5–5.1)
Sodium: 135 mmol/L (ref 135–145)

## 2021-04-01 LAB — BLOOD CULTURE ID PANEL (REFLEXED) - BCID2

## 2021-04-01 MED ORDER — POLYETHYLENE GLYCOL 3350 17 G PO PACK
17.0000 g | PACK | Freq: Every day | ORAL | Status: DC
  Administered 2021-04-01 – 2021-04-02 (×2): 17 g via ORAL
  Filled 2021-04-01: qty 1

## 2021-04-01 MED ORDER — SODIUM CHLORIDE 0.9 % IV SOLN
2.0000 g | INTRAVENOUS | Status: DC
  Administered 2021-04-01 – 2021-04-02 (×2): 2 g via INTRAVENOUS
  Filled 2021-04-01 (×2): qty 20

## 2021-04-01 NOTE — Plan of Care (Signed)
Patient here with urosepsis. Plan for tentative discharge to home tomorrow.   Problem: Education: Goal: Knowledge of General Education information will improve Description: Including pain rating scale, medication(s)/side effects and non-pharmacologic comfort measures Outcome: Progressing   Problem: Health Behavior/Discharge Planning: Goal: Ability to manage health-related needs will improve Outcome: Progressing   Problem: Clinical Measurements: Goal: Ability to maintain clinical measurements within normal limits will improve Outcome: Progressing Goal: Will remain free from infection Outcome: Progressing Goal: Diagnostic test results will improve Outcome: Progressing Goal: Respiratory complications will improve Outcome: Progressing Goal: Cardiovascular complication will be avoided Outcome: Progressing   Problem: Activity: Goal: Risk for activity intolerance will decrease Outcome: Progressing   Problem: Nutrition: Goal: Adequate nutrition will be maintained Outcome: Progressing   Problem: Coping: Goal: Level of anxiety will decrease Outcome: Progressing   Problem: Elimination: Goal: Will not experience complications related to bowel motility Outcome: Progressing Goal: Will not experience complications related to urinary retention Outcome: Progressing   Problem: Pain Managment: Goal: General experience of comfort will improve Outcome: Progressing   Problem: Safety: Goal: Ability to remain free from injury will improve Outcome: Progressing   Problem: Skin Integrity: Goal: Risk for impaired skin integrity will decrease Outcome: Progressing

## 2021-04-01 NOTE — Plan of Care (Signed)

## 2021-04-01 NOTE — Progress Notes (Addendum)
PROGRESS NOTE  [name] HEK:[id number] DOB: 1961-06-08 DOA: 03/30/2021 PCP: Kathyrn Drown, MD  HPI/Recap of past 68 hours: 60 year old female with past medical history of hypothyroidism, obesity and depression presented to the emergency room on 6/25 with 1 day history of flank pain and 4 days of dysuria and increased urinary frequency.  In the emergency room, found to have sepsis secondary to UTI.  Admitted to the hospital service and started on IV fluids plus antibiotics.  Overnight, blood cultures positive for E. coli, pansensitive.  Renal function improved with hemoglobin down to 10.5.  Patient states that earlier this morning she was feeling better however now she is hurting all over.  No bowel movement since admission.  Assessment/Plan: Principal Problem: E. coli sepsis due to UTI: Patient met criteria for sepsis on admission secondary to urinary source given fever, tachycardia, tachypnea and white blood cell count.  She also had mild acute kidney injury.  Started on IV fluids.  Creatinine much improved and white count normalizing.  Continue IV antibiotics, adjusted to 2 g daily given positive blood cultures Active Problems:   Depression: Continue home medications.    Hypothyroidism: Continue Synthroid.    Chronic pain syndrome    AKI (acute kidney injury) ([place]): Last creatinine was 1.092 years ago.  Continue IV fluids.  Creatinine today at 1.06.  Anemia: Hemoglobin down to 10.5 from 12.2 yesterday however given creatinine, more likely initially falsely elevated due to hemoconcentration    Obesity (BMI 30-39.9): Meets criteria BMI greater than 30  Elevated TSH: Only mildly elevated and difficult to determine in the setting of acute infection.  Would favor rechecking in a month after fully recovered from infection   Code Status: Full code  Family Communication: Left message for husband  Disposition Plan: Anticipate discharge in the next 1 to 2  days   Consultants: None  Procedures: None  Antimicrobials: IV Rocephin 6/25-present  DVT prophylaxis: SCDs  Level of care: Telemetry   Objective: Vitals:   03/31/21 2113 04/01/21 0515  BP: (!) 142/74 137/74  Pulse: 96 95  Resp: 20 18  Temp: 100.3 F (37.9 C) 99.6 F (37.6 C)  SpO2: 96% 91%    Intake/Output Summary (Last 24 hours) at 04/01/2021 1224 Last data filed at 04/01/2021 0900 Gross per 24 hour  Intake 480 ml  Output --  Net 480 ml    Filed Weights   03/30/21 1508  Weight: 81.6 kg   Body mass index is 30.9 kg/m.  Exam:  General: Alert and oriented x3, fatigued HEENT: Normocephalic and atraumatic, mucous membranes are slightly dry Cardiovascular: Regular rate and rhythm, S1-S2 Respiratory: Clear to auscultation bilaterally Abdomen: Soft, nontender, mildly distended, positive bowel sounds Back: Bilateral flank tenderness, more so on the right Musculoskeletal: No clubbing or cyanosis or edema Skin: No skin breaks, tears or lesions Psychiatry: Appropriate, no evidence of psychoses Neurology: No focal deficits   Data Reviewed: CBC: Recent Labs  Lab 03/30/21 1521 03/31/21 0344 04/01/21 0519  WBC 19.7* 13.3* 8.9  NEUTROABS 16.1*  --   --   HGB 15.1* 12.2 10.5*  HCT 46.7* 38.6 32.7*  MCV 88.8 90.4 88.9  PLT 198 152 144*    Basic Metabolic Panel: Recent Labs  Lab 03/30/21 1521 03/31/21 0344 04/01/21 0519  NA 135 136 135  K 3.6 3.5 3.4*  CL 100 103 107  CO2 27 25 21*  GLUCOSE 136* 119* 104*  BUN _0 CREATININE 1.44* 1.44* 1.06*  CALCIUM  8.6* 7.6* 7.2*    GFR: Estimated Creatinine Clearance: 58.4 mL/min (A) (by C-G formula based on SCr of 1.06 mg/dL (H)). Liver Function Tests: Recent Labs  Lab 03/30/21 1521  AST 17  ALT 18  ALKPHOS 73  BILITOT 0.9  PROT 7.8  ALBUMIN 3.6    No results for input(s): LIPASE, AMYLASE in the last 168 hours. No results for input(s): AMMONIA in the last 168 hours. Coagulation  Profile: Recent Labs  Lab 03/30/21 1521  INR 1.1    Cardiac Enzymes: No results for input(s): CKTOTAL, CKMB, CKMBINDEX, TROPONINI in the last 168 hours. BNP (last 3 results) No results for input(s): PROBNP in the last 8760 hours. HbA1C: No results for input(s): HGBA1C in the last 72 hours. CBG: No results for input(s): GLUCAP in the last 168 hours. Lipid Profile: No results for input(s): CHOL, HDL, LDLCALC, TRIG, CHOLHDL, LDLDIRECT in the last 72 hours. Thyroid Function Tests: Recent Labs    03/31/21 0344  TSH 6.868*    Anemia Panel: No results for input(s): VITAMINB12, FOLATE, FERRITIN, TIBC, IRON, RETICCTPCT in the last 72 hours. Urine analysis:    Component Value Date/Time   COLORURINE AMBER (A) 03/30/2021 1521   APPEARANCEUR HAZY (A) 03/30/2021 1521   LABSPEC 1.019 03/30/2021 1521   PHURINE 5.0 03/30/2021 1521   GLUCOSEU NEGATIVE 03/30/2021 1521   HGBUR LARGE (A) 03/30/2021 1521   BILIRUBINUR NEGATIVE 03/30/2021 1521   KETONESUR NEGATIVE 03/30/2021 1521   PROTEINUR 100 (A) 03/30/2021 1521   UROBILINOGEN 0.2 03/08/2015 2027   NITRITE POSITIVE (A) 03/30/2021 1521   LEUKOCYTESUR NEGATIVE 03/30/2021 1521   Sepsis Labs: _0 (procalcitonin:4,lacticidven:4)  ) Recent Results (from the past 240 hour(s))  Urine culture     Status: Abnormal (Preliminary result)   Collection Time: 03/30/21  3:21 PM   Specimen: In/Out Cath Urine  Result Value Ref Range Status   Specimen Description   Final    IN/OUT CATH URINE Performed at [facility], [address] [place], [place] [address]    Special Requests   Final    NONE Performed at [facility], [address] [place], [place] [address]    Culture (A)  Final    >=100,000 COLONIES/mL GRAM NEGATIVE RODS SUSCEPTIBILITIES TO FOLLOW Performed at [facility], [place] [address] [place], [place] [address]    Report Status PENDING  Incomplete  Resp Panel by RT-PCR (Flu A&B, Covid) Nasopharyngeal Swab     Status:  None   Collection Time: 03/30/21  3:22 PM   Specimen: Nasopharyngeal Swab; Nasopharyngeal(NP) swabs in vial transport medium  Result Value Ref Range Status   SARS Coronavirus 2 by RT PCR NEGATIVE NEGATIVE Final    Comment: (NOTE) SARS-CoV-2 target nucleic acids are NOT DETECTED.  The SARS-CoV-2 RNA is generally detectable in upper respiratory specimens during the acute phase of infection. The lowest concentration of SARS-CoV-2 viral copies this assay can detect is 138 copies/mL. A negative result does not preclude SARS-Cov-2 infection and should not be used as the sole basis for treatment or other patient management decisions. A negative result may occur with  improper specimen collection/handling, submission of specimen other than nasopharyngeal swab, presence of viral mutation(s) within the areas targeted by this assay, and inadequate number of viral copies(<138 copies/mL). A negative result must be combined with clinical observations, patient history, and epidemiological information. The expected result is Negative.  Fact Sheet for Patients:  EntrepreneurPulse.com.au  Fact Sheet for Healthcare Providers:  IncredibleEmployment.be  This test is no  t yet approved or cleared by the [place] and  has been authorized for detection and/or diagnosis of SARS-CoV-2 by FDA under an Emergency Use Authorization (EUA). This EUA will remain  in effect (meaning this test can be used) for the duration of the COVID-19 declaration under Section 564(b)(1) of the Act, 21 U.S.C.section 360bbb-3(b)(1), unless the authorization is terminated  or revoked sooner.       Influenza A by PCR NEGATIVE NEGATIVE Final   Influenza B by PCR NEGATIVE NEGATIVE Final    Comment: (NOTE) The Xpert Xpress SARS-CoV-2/FLU/RSV plus assay is intended as an aid in the diagnosis of influenza from Nasopharyngeal swab specimens and should not be used as a sole basis for  treatment. Nasal washings and aspirates are unacceptable for Xpert Xpress SARS-CoV-2/FLU/RSV testing.  Fact Sheet for Patients: EntrepreneurPulse.com.au  Fact Sheet for Healthcare Providers: IncredibleEmployment.be  This test is not yet approved or cleared by the [place] FDA and has been authorized for detection and/or diagnosis of SARS-CoV-2 by FDA under an Emergency Use Authorization (EUA). This EUA will remain in effect (meaning this test can be used) for the duration of the COVID-19 declaration under Section 564(b)(1) of the Act, 21 U.S.C. section 360bbb-3(b)(1), unless the authorization is terminated or revoked.  Performed at [facility], [address], [place], [place] [address]   Blood Culture (routine x 2)     Status: None (Preliminary result)   Collection Time: 03/30/21  4:25 PM   Specimen: Left Antecubital; Blood  Result Value Ref Range Status   Specimen Description   Final    LEFT ANTECUBITAL BOTTLES DRAWN AEROBIC AND ANAEROBIC   Special Requests Blood Culture adequate volume  Final   Culture   Final    NO GROWTH 2 DAYS Performed at [facility], [address] [place], [place] [address]    Report Status PENDING  Incomplete  Blood Culture (routine x 2)     Status: None (Preliminary result)   Collection Time: 03/30/21  4:27 PM   Specimen: BLOOD LEFT ARM  Result Value Ref Range Status   Specimen Description   Final    BLOOD LEFT ARM BOTTLES DRAWN AEROBIC AND ANAEROBIC Performed at [facility], [address] [place], [place] [address]    Special Requests   Final    Blood Culture adequate volume Performed at [facility], [address] [place], [place] [address]    Culture  Setup Time   Final    AEROBIC BOTTLE ONLY GRAM NEGATIVE RODS CRITICAL RESULT CALLED TO, READ BACK BY AND VERIFIED WITH: [id number] 03/31/2021 COLEMAN,R Organism ID to follow Performed at [facility], [place] [address], [place],  [place] [address]    Culture GRAM NEGATIVE RODS  Final   Report Status PENDING  Incomplete  Blood Culture ID Panel (Reflexed)     Status: Abnormal   Collection Time: 03/30/21  4:27 PM  Result Value Ref Range Status   Enterococcus faecalis NOT DETECTED NOT DETECTED Final   Enterococcus Faecium NOT DETECTED NOT DETECTED Final   Listeria monocytogenes NOT DETECTED NOT DETECTED Final   Staphylococcus species NOT DETECTED NOT DETECTED Final   Staphylococcus aureus (BCID) NOT DETECTED NOT DETECTED Final   Staphylococcus epidermidis NOT DETECTED NOT DETECTED Final   Staphylococcus lugdunensis NOT DETECTED NOT DETECTED Final   Streptococcus species NOT DETECTED NOT DETECTED Final   Streptococcus agalactiae NOT DETECTED NOT DETECTED Final   Streptococcus pneumoniae NOT DETECTED NOT DETECTED Final   Streptococcus  pyogenes NOT DETECTED NOT DETECTED Final   A.calcoaceticus-baumannii NOT DETECTED NOT DETECTED Final   Bacteroides fragilis NOT DETECTED NOT DETECTED Final   Enterobacterales DETECTED (A) NOT DETECTED Final    Comment: Enterobacterales represent a large order of gram negative bacteria, not a single organism. CRITICAL RESULT CALLED TO, READ BACK BY AND VERIFIED WITH: BUNDURANT RN, AT 0142 04/01/21 D. VANHOOK    Enterobacter cloacae complex NOT DETECTED NOT DETECTED Final   Escherichia coli DETECTED (A) NOT DETECTED Final    Comment: CRITICAL RESULT CALLED TO, READ BACK BY AND VERIFIED WITH: BUNDURANT RN, AT 0142 04/01/21 D. VANHOOK    Klebsiella aerogenes NOT DETECTED NOT DETECTED Final   Klebsiella oxytoca NOT DETECTED NOT DETECTED Final   Klebsiella pneumoniae NOT DETECTED NOT DETECTED Final   Proteus species NOT DETECTED NOT DETECTED Final   Salmonella species NOT DETECTED NOT DETECTED Final   Serratia marcescens NOT DETECTED NOT DETECTED Final   Haemophilus influenzae NOT DETECTED NOT DETECTED Final   Neisseria meningitidis NOT DETECTED NOT DETECTED Final   Pseudomonas aeruginosa NOT  DETECTED NOT DETECTED Final   Stenotrophomonas maltophilia NOT DETECTED NOT DETECTED Final   Candida albicans NOT DETECTED NOT DETECTED Final   Candida auris NOT DETECTED NOT DETECTED Final   Candida glabrata NOT DETECTED NOT DETECTED Final   Candida krusei NOT DETECTED NOT DETECTED Final   Candida parapsilosis NOT DETECTED NOT DETECTED Final   Candida tropicalis NOT DETECTED NOT DETECTED Final   Cryptococcus neoformans/gattii NOT DETECTED NOT DETECTED Final   CTX-M ESBL NOT DETECTED NOT DETECTED Final   Carbapenem resistance IMP NOT DETECTED NOT DETECTED Final   Carbapenem resistance KPC NOT DETECTED NOT DETECTED Final   Carbapenem resistance NDM NOT DETECTED NOT DETECTED Final   Carbapenem resist OXA 48 LIKE NOT DETECTED NOT DETECTED Final   Carbapenem resistance VIM NOT DETECTED NOT DETECTED Final    Comment: Performed at [facility], [address] [place], [place] [address]      Studies: No results found.  Scheduled Meds:  gabapentin  300 mg Oral TID   heparin  5,000 Units Subcutaneous Q8H   sodium chloride flush  3 mL Intravenous Q12H   sodium chloride flush  3 mL Intravenous Q12H    Continuous Infusions:  sodium chloride 150 mL/hr at 04/01/21 0925   sodium chloride 250 mL (03/30/21 1839)   cefTRIAXone (ROCEPHIN)  IV       LOS: 2 days     Annita Brod, MD [facility]   04/01/2021, 12:24 PM

## 2021-04-02 DIAGNOSIS — A4151 Sepsis due to Escherichia coli [E. coli]: Principal | ICD-10-CM

## 2021-04-02 DIAGNOSIS — K5900 Constipation, unspecified: Secondary | ICD-10-CM

## 2021-04-02 LAB — BASIC METABOLIC PANEL
Anion gap: 4 — ABNORMAL LOW (ref 5–15)
BUN: 15 mg/dL (ref 6–20)
CO2: 23 mmol/L (ref 22–32)
Calcium: 7.6 mg/dL — ABNORMAL LOW (ref 8.9–10.3)
Chloride: 112 mmol/L — ABNORMAL HIGH (ref 98–111)
Creatinine, Ser: 0.95 mg/dL (ref 0.44–1.00)
GFR, Estimated: >60 mL/min (ref >60)
Glucose, Bld: 104 mg/dL — ABNORMAL HIGH (ref 70–99)
Potassium: 3.4 mmol/L — ABNORMAL LOW (ref 3.5–5.1)
Sodium: 139 mmol/L (ref 135–145)

## 2021-04-02 LAB — URINE CULTURE: Culture: >=100000 — AB

## 2021-04-02 LAB — CBC
HCT: 34.2 % — ABNORMAL LOW (ref 36.0–46.0)
Hemoglobin: 10.8 g/dL — ABNORMAL LOW (ref 12.0–15.0)
MCH: 28.1 pg (ref 26.0–34.0)
MCHC: 31.6 g/dL (ref 30.0–36.0)
MCV: 89.1 fL (ref 80.0–100.0)
Platelets: 146 10*3/uL — ABNORMAL LOW (ref 150–400)
RBC: 3.84 MIL/uL — ABNORMAL LOW (ref 3.87–5.11)
RDW: 14.5 % (ref 11.5–15.5)
WBC: 6.3 10*3/uL (ref 4.0–10.5)
nRBC: 0 % (ref 0.0–0.2)

## 2021-04-02 MED ORDER — POLYETHYLENE GLYCOL 3350 17 G PO PACK: 17.0000 g | PACK | Freq: Every day | ORAL | 0 refills | Status: DC | PRN

## 2021-04-02 MED ORDER — LACTULOSE 10 GM/15ML PO SOLN
10.0000 g | ORAL | Status: AC
  Administered 2021-04-02: 10 g via ORAL
  Filled 2021-04-02: qty 30

## 2021-04-02 MED ORDER — ONDANSETRON HCL 4 MG PO TABS: 4.0000 mg | ORAL_TABLET | Freq: Four times a day (QID) | ORAL | 0 refills | Status: DC | PRN

## 2021-04-02 MED ORDER — TRAMADOL HCL 50 MG PO TABS: 50.0000 mg | ORAL_TABLET | Freq: Four times a day (QID) | ORAL | 0 refills | Status: DC | PRN

## 2021-04-02 MED ORDER — CIPROFLOXACIN HCL 500 MG PO TABS: 500.0000 mg | ORAL_TABLET | Freq: Two times a day (BID) | ORAL | 0 refills | Status: AC

## 2021-04-02 NOTE — Progress Notes (Signed)
Discharge paper work given to patient.  Iv removed.  Patient leave floor in stable condition.

## 2021-04-02 NOTE — Discharge Summary (Signed)
Discharge Summary  Daisy [name] WER:[id number] DOB: 11-15-1960  PCP: Kathyrn Drown, MD  Admit date: 03/30/2021 Discharge date: 04/02/2021  Time spent: 35 minutes  Recommendations for Outpatient Follow-up:  New medication: Ultram 50 mg p.o. every 6 hours as needed for pain New medication: Cipro 500 mg p.o. twice daily x7 days New medication: Zofran 4 mg p.o. as needed for nausea  Discharge Diagnoses:  Active Hospital Problems   Diagnosis Date Noted   Sepsis due to UTI 03/30/2021   Obesity (BMI 30-39.9) 03/31/2021   Suspect Acute lower UTI 03/30/2021   AKI (acute kidney injury) ([place]) 03/30/2021   Hypothyroidism 04/29/2013   Chronic pain syndrome 04/29/2013   Depression 02/17/2013    Resolved Hospital Problems  No resolved problems to display.    Discharge Condition: Improved, being discharged home  Diet recommendation: Heart healthy  Vitals:   04/01/21 2123 04/02/21 0640  BP: 134/76 (!) 153/90  Pulse: 82 73  Resp: 20 18  Temp: 98.7 F (37.1 C) 98.1 F (36.7 C)  SpO2: 95% 96%    History of present illness:  60 year old female with past medical history of hypothyroidism, obesity and depression presented to the emergency room on 6/25 with 1 day history of flank pain and 4 days of dysuria and increased urinary frequency.  In the emergency room, found to have sepsis secondary to UTI.  Admitted to the hospital service and started on IV fluids plus antibiotics.  Hospital Course:  E. coli sepsis due to UTI: Patient met criteria for sepsis on admission secondary to urinary source given fever, tachycardia, tachypnea and white blood cell count.  She also had mild acute kidney injury.  Started on IV fluids.  By following day, blood cultures also positive for E. coli.  Urine grew out ESBL E. coli, sensitive to Rocephin and p.o. Cipro.  By 6/28, creatinine normalized as well as white count.  Patient feeling better so plan to discharge home with 7 more days of p.o. antibiotics for  total of 10 days of therapy. Active Problems:   Depression: Continue home medications.  Constipation: Patient with no bowel movement since admission.  Was given MiraLAX starting on 6/27 followed by Dulcolax suppository but not much output.  Trying dose of lactulose.  If she moves her bowels, okay for discharge on 6/28     Hypothyroidism: TSH checked during hospitalization and found be mildly elevated at 6 and difficult to determine in the setting of acute infection.  She is supposed to be on a desiccated thyroid, but has not really been taking this medication.  Will defer to her PCP.     Chronic pain syndrome     AKI (acute kidney injury) [facility]): Creatinine on day of discharge down to 0.95      Obesity (BMI 30-39.9): Meets criteria BMI greater than 30   Procedures: None  Consultations: None  Discharge Exam: BP (!) 153/90 (BP Location: Right Arm)   Pulse 73   Temp 98.1 F (36.7 C) (Oral)   Resp 18   Ht '5\' 4"'  (1.626 m)   Wt 81.6 kg   SpO2 96%   BMI 30.90 kg/m   General: Alert and oriented x3, no acute distress Cardiovascular: Regular rate and rhythm, S1-S2 Respiratory: Clear to auscultation bilaterally  Discharge Instructions You were cared for by a hospitalist during your hospital stay. If you have any questions about your discharge medications or the care you received while you were in the hospital after you are discharged, you can  call the unit and asked to speak with the hospitalist on call if the hospitalist that took care of you is not available. Once you are discharged, your primary care physician will handle any further medical issues. Please note that NO REFILLS for any discharge medications will be authorized once you are discharged, as it is imperative that you return to your primary care physician (or establish a relationship with a primary care physician if you do not have one) for your aftercare needs so that they can reassess your need for medications and monitor  your lab values.   Allergies as of 04/02/2021   No Known Allergies      Medication List     TAKE these medications    acetaminophen 650 MG CR tablet Commonly known as: TYLENOL Take 1,300 mg by mouth every 8 (eight) hours as needed for pain.   ciprofloxacin 500 MG tablet Commonly known as: Cipro Take 1 tablet (500 mg total) by mouth 2 (two) times daily for 7 days.   ondansetron 4 MG tablet Commonly known as: ZOFRAN Take 1 tablet (4 mg total) by mouth every 6 (six) hours as needed for nausea.   polyethylene glycol 17 g packet Commonly known as: MIRALAX / GLYCOLAX Take 17 g by mouth daily as needed for mild constipation.   traMADol 50 MG tablet Commonly known as: Ultram Take 1 tablet (50 mg total) by mouth every 6 (six) hours as needed.       ASK your doctor about these medications    Euthyrox 50 MCG tablet Generic drug: levothyroxine Take 1 tablet by mouth once daily       No Known Allergies    The results of significant diagnostics from this hospitalization (including imaging, microbiology, ancillary and laboratory) are listed below for reference.    Significant Diagnostic Studies: DG Chest Port 1 View  Result Date: 03/30/2021 CLINICAL DATA:  Possible sepsis. EXAM: PORTABLE CHEST 1 VIEW COMPARISON:  Chest x-ray dated October 29, 2017. FINDINGS: The heart size and mediastinal contours are within normal limits. Both lungs are clear. The visualized skeletal structures are unremarkable. IMPRESSION: No active disease. Electronically Signed   By: Titus Dubin M.D.   On: 03/30/2021 15:48    Microbiology: Recent Results (from the past 240 hour(s))  Urine culture     Status: Abnormal   Collection Time: 03/30/21  3:21 PM   Specimen: In/Out Cath Urine  Result Value Ref Range Status   Specimen Description   Final    IN/OUT CATH URINE Performed at [facility], [address], [place], [place] [address]    Special Requests   Final    NONE Performed at [facility], [address] [place], [place] [address]    Culture (A)  Final    >=100,000 COLONIES/mL ESCHERICHIA COLI Confirmed Extended Spectrum Beta-Lactamase Producer (ESBL).  In bloodstream infections from ESBL organisms, carbapenems are preferred over piperacillin/tazobactam. They are shown to have a lower risk of mortality.    Report Status 04/02/2021 FINAL  Final   Organism ID, Bacteria ESCHERICHIA COLI (A)  Final      Susceptibility   Escherichia coli - MIC*    AMPICILLIN >=32 RESISTANT Resistant     CEFAZOLIN RESISTANT Resistant     CEFEPIME <=0.12 SENSITIVE Sensitive     CEFTRIAXONE 0.5 SENSITIVE Sensitive     CIPROFLOXACIN <=0.25 SENSITIVE Sensitive     GENTAMICIN <=1 SENSITIVE Sensitive     IMIPENEM <=0.25 SENSITIVE Sensitive     NITROFURANTOIN <=  16 SENSITIVE Sensitive     TRIMETH/SULFA <=20 SENSITIVE Sensitive     AMPICILLIN/SULBACTAM >=32 RESISTANT Resistant     PIP/TAZO <=4 SENSITIVE Sensitive     * >=100,000 COLONIES/mL ESCHERICHIA COLI  Resp Panel by RT-PCR (Flu A&B, Covid) Nasopharyngeal Swab     Status: None   Collection Time: 03/30/21  3:22 PM   Specimen: Nasopharyngeal Swab; Nasopharyngeal(NP) swabs in vial transport medium  Result Value Ref Range Status   SARS Coronavirus 2 by RT PCR NEGATIVE NEGATIVE Final    Comment: (NOTE) SARS-CoV-2 target nucleic acids are NOT DETECTED.  The SARS-CoV-2 RNA is generally detectable in upper respiratory specimens during the acute phase of infection. The lowest concentration of SARS-CoV-2 viral copies this assay can detect is 138 copies/mL. A negative result does not preclude SARS-Cov-2 infection and should not be used as the sole basis for treatment or other patient management decisions. A negative result may occur with  improper specimen collection/handling, submission of specimen other than nasopharyngeal swab, presence of viral mutation(s) within the areas targeted by this assay, and inadequate number of viral copies(<138  copies/mL). A negative result must be combined with clinical observations, patient history, and epidemiological information. The expected result is Negative.  Fact Sheet for Patients:  EntrepreneurPulse.com.au  Fact Sheet for Healthcare Providers:  IncredibleEmployment.be  This test is no t yet approved or cleared by the [place] FDA and  has been authorized for detection and/or diagnosis of SARS-CoV-2 by FDA under an Emergency Use Authorization (EUA). This EUA will remain  in effect (meaning this test can be used) for the duration of the COVID-19 declaration under Section 564(b)(1) of the Act, 21 U.S.C.section 360bbb-3(b)(1), unless the authorization is terminated  or revoked sooner.       Influenza A by PCR NEGATIVE NEGATIVE Final   Influenza B by PCR NEGATIVE NEGATIVE Final    Comment: (NOTE) The Xpert Xpress SARS-CoV-2/FLU/RSV plus assay is intended as an aid in the diagnosis of influenza from Nasopharyngeal swab specimens and should not be used as a sole basis for treatment. Nasal washings and aspirates are unacceptable for Xpert Xpress SARS-CoV-2/FLU/RSV testing.  Fact Sheet for Patients: EntrepreneurPulse.com.au  Fact Sheet for Healthcare Providers: IncredibleEmployment.be  This test is not yet approved or cleared by the [place] FDA and has been authorized for detection and/or diagnosis of SARS-CoV-2 by FDA under an Emergency Use Authorization (EUA). This EUA will remain in effect (meaning this test can be used) for the duration of the COVID-19 declaration under Section 564(b)(1) of the Act, 21 U.S.C. section 360bbb-3(b)(1), unless the authorization is terminated or revoked.  Performed at [facility], [address], [place], [place] [address]   Blood Culture (routine x 2)     Status: None (Preliminary result)   Collection Time: 03/30/21  4:25 PM   Specimen: Left Antecubital; Blood   Result Value Ref Range Status   Specimen Description   Final    LEFT ANTECUBITAL BOTTLES DRAWN AEROBIC AND ANAEROBIC   Special Requests Blood Culture adequate volume  Final   Culture   Final    NO GROWTH 3 DAYS Performed at [facility], [address], [place], [place] [address]    Report Status PENDING  Incomplete  Blood Culture (routine x 2)     Status: Abnormal (Preliminary result)   Collection Time: 03/30/21  4:27 PM   Specimen: BLOOD LEFT ARM  Result Value Ref Range Status   Specimen Description   Final    BLOOD LEFT  ARM BOTTLES DRAWN AEROBIC AND ANAEROBIC Performed at [facility], [address], [place], [place] [address]    Special Requests   Final    Blood Culture adequate volume Performed at [facility], [address] [place], [place] [address]    Culture  Setup Time   Final    AEROBIC BOTTLE ONLY GRAM NEGATIVE RODS CRITICAL RESULT CALLED TO, READ BACK BY AND VERIFIED WITH: [facility] 1539 03/31/2021 COLEMAN,R Organism ID to follow    Culture (A)  Final    ESCHERICHIA COLI REPEATING SUSCEPTIBILITY Performed at [facility], [place] [address] [place], [place] [address]    Report Status PENDING  Incomplete  Blood Culture ID Panel (Reflexed)     Status: Abnormal   Collection Time: 03/30/21  4:27 PM  Result Value Ref Range Status   Enterococcus faecalis NOT DETECTED NOT DETECTED Final   Enterococcus Faecium NOT DETECTED NOT DETECTED Final   Listeria monocytogenes NOT DETECTED NOT DETECTED Final   Staphylococcus species NOT DETECTED NOT DETECTED Final   Staphylococcus aureus (BCID) NOT DETECTED NOT DETECTED Final   Staphylococcus epidermidis NOT DETECTED NOT DETECTED Final   Staphylococcus lugdunensis NOT DETECTED NOT DETECTED Final   Streptococcus species NOT DETECTED NOT DETECTED Final   Streptococcus agalactiae NOT DETECTED NOT DETECTED Final   Streptococcus pneumoniae NOT DETECTED NOT DETECTED Final   Streptococcus pyogenes NOT DETECTED NOT DETECTED  Final   A.calcoaceticus-baumannii NOT DETECTED NOT DETECTED Final   Bacteroides fragilis NOT DETECTED NOT DETECTED Final   Enterobacterales DETECTED (A) NOT DETECTED Final    Comment: Enterobacterales represent a large order of gram negative bacteria, not a single organism. CRITICAL RESULT CALLED TO, READ BACK BY AND VERIFIED WITH: BUNDURANT RN, AT 0142 04/01/21 D. VANHOOK    Enterobacter cloacae complex NOT DETECTED NOT DETECTED Final   Escherichia coli DETECTED (A) NOT DETECTED Final    Comment: CRITICAL RESULT CALLED TO, READ BACK BY AND VERIFIED WITH: BUNDURANT RN, AT 0142 04/01/21 D. VANHOOK    Klebsiella aerogenes NOT DETECTED NOT DETECTED Final   Klebsiella oxytoca NOT DETECTED NOT DETECTED Final   Klebsiella pneumoniae NOT DETECTED NOT DETECTED Final   Proteus species NOT DETECTED NOT DETECTED Final   Salmonella species NOT DETECTED NOT DETECTED Final   Serratia marcescens NOT DETECTED NOT DETECTED Final   Haemophilus influenzae NOT DETECTED NOT DETECTED Final   Neisseria meningitidis NOT DETECTED NOT DETECTED Final   Pseudomonas aeruginosa NOT DETECTED NOT DETECTED Final   Stenotrophomonas maltophilia NOT DETECTED NOT DETECTED Final   Candida albicans NOT DETECTED NOT DETECTED Final   Candida auris NOT DETECTED NOT DETECTED Final   Candida glabrata NOT DETECTED NOT DETECTED Final   Candida krusei NOT DETECTED NOT DETECTED Final   Candida parapsilosis NOT DETECTED NOT DETECTED Final   Candida tropicalis NOT DETECTED NOT DETECTED Final   Cryptococcus neoformans/gattii NOT DETECTED NOT DETECTED Final   CTX-M ESBL NOT DETECTED NOT DETECTED Final   Carbapenem resistance IMP NOT DETECTED NOT DETECTED Final   Carbapenem resistance KPC NOT DETECTED NOT DETECTED Final   Carbapenem resistance NDM NOT DETECTED NOT DETECTED Final   Carbapenem resist OXA 48 LIKE NOT DETECTED NOT DETECTED Final   Carbapenem resistance VIM NOT DETECTED NOT DETECTED Final    Comment: Performed at [facility], [address], [place],  [address]     Labs: Basic Metabolic Panel: Recent Labs  Lab 03/30/21 1521 03/31/21 0344 04/01/21 0519 04/02/21 0451  NA 135 136 135  139  K 3.6 3.5 3.4* 3.4*  CL 100 103 107 112*  CO2 27 25 21* 23  GLUCOSE 136* 119* 104* 104*  BUN '18 16 16 15  ' CREATININE 1.44* 1.44* 1.06* 0.95  CALCIUM 8.6* 7.6* 7.2* 7.6*   Liver Function Tests: Recent Labs  Lab 03/30/21 1521  AST 17  ALT 18  ALKPHOS 73  BILITOT 0.9  PROT 7.8  ALBUMIN 3.6   No results for input(s): LIPASE, AMYLASE in the last 168 hours. No results for input(s): AMMONIA in the last 168 hours. CBC: Recent Labs  Lab 03/30/21 1521 03/31/21 0344 04/01/21 0519 04/02/21 0451  WBC 19.7* 13.3* 8.9 6.3  NEUTROABS 16.1*  --   --   --   HGB 15.1* 12.2 10.5* 10.8*  HCT 46.7* 38.6 32.7* 34.2*  MCV 88.8 90.4 88.9 89.1  PLT 198 152 144* 146*   Cardiac Enzymes: No results for input(s): CKTOTAL, CKMB, CKMBINDEX, TROPONINI in the last 168 hours. BNP: BNP (last 3 results) No results for input(s): BNP in the last 8760 hours.  ProBNP (last 3 results) No results for input(s): PROBNP in the last 8760 hours.  CBG: No results for input(s): GLUCAP in the last 168 hours.     Signed:  Annita Brod, MD [facility] 04/02/2021, 1:57 PM

## 2021-04-02 NOTE — Plan of Care (Signed)

## 2021-04-03 LAB — CULTURE, BLOOD (ROUTINE X 2): Special Requests: ADEQUATE

## 2021-04-04 LAB — CULTURE, BLOOD (ROUTINE X 2)
Culture: NO GROWTH
Special Requests: ADEQUATE

## 2021-04-22 ENCOUNTER — Ambulatory Visit (INDEPENDENT_AMBULATORY_CARE_PROVIDER_SITE_OTHER): Payer: Medicaid Other | Admitting: Family Medicine

## 2021-04-22 ENCOUNTER — Encounter: Payer: Self-pay | Admitting: Family Medicine

## 2021-04-22 ENCOUNTER — Other Ambulatory Visit: Payer: Self-pay | Admitting: Family Medicine

## 2021-04-22 ENCOUNTER — Other Ambulatory Visit: Payer: Self-pay

## 2021-04-22 ENCOUNTER — Telehealth: Payer: Self-pay | Admitting: Family Medicine

## 2021-04-22 ENCOUNTER — Ambulatory Visit (HOSPITAL_COMMUNITY)
Admission: RE | Admit: 2021-04-22 | Discharge: 2021-04-22 | Disposition: A | Discharge status: Home / Self Care | Payer: Self-pay | Source: Ambulatory Visit | Attending: Family Medicine | Admitting: Family Medicine

## 2021-04-22 ENCOUNTER — Other Ambulatory Visit (HOSPITAL_COMMUNITY)
Admission: RE | Admit: 2021-04-22 | Discharge: 2021-04-22 | Disposition: A | Discharge status: Home / Self Care | Payer: Medicaid Other | Source: Ambulatory Visit | Attending: Family Medicine | Admitting: Family Medicine

## 2021-04-22 VITALS — BP 168/112 | HR 103 | Temp 97.7°F | Wt 181.6 lb

## 2021-04-22 DIAGNOSIS — R109 Unspecified abdominal pain: Secondary | ICD-10-CM | POA: Insufficient documentation

## 2021-04-22 DIAGNOSIS — R3129 Other microscopic hematuria: Secondary | ICD-10-CM

## 2021-04-22 DIAGNOSIS — A419 Sepsis, unspecified organism: Secondary | ICD-10-CM | POA: Insufficient documentation

## 2021-04-22 DIAGNOSIS — R3989 Other symptoms and signs involving the genitourinary system: Secondary | ICD-10-CM | POA: Insufficient documentation

## 2021-04-22 DIAGNOSIS — R9389 Abnormal findings on diagnostic imaging of other specified body structures: Secondary | ICD-10-CM

## 2021-04-22 DIAGNOSIS — Z87440 Personal history of urinary (tract) infections: Secondary | ICD-10-CM

## 2021-04-22 DIAGNOSIS — Z87448 Personal history of other diseases of urinary system: Secondary | ICD-10-CM

## 2021-04-22 DIAGNOSIS — D649 Anemia, unspecified: Secondary | ICD-10-CM

## 2021-04-22 LAB — BASIC METABOLIC PANEL
Anion gap: 9 (ref 5–15)
BUN: 20 mg/dL (ref 6–20)
CO2: 22 mmol/L (ref 22–32)
Calcium: 9.4 mg/dL (ref 8.9–10.3)
Chloride: 103 mmol/L (ref 98–111)
Creatinine, Ser: 1.11 mg/dL — ABNORMAL HIGH (ref 0.44–1.00)
GFR, Estimated: 57 mL/min — ABNORMAL LOW (ref >60)
Glucose, Bld: 106 mg/dL — ABNORMAL HIGH (ref 70–99)
Potassium: 4.5 mmol/L (ref 3.5–5.1)
Sodium: 134 mmol/L — ABNORMAL LOW (ref 135–145)

## 2021-04-22 LAB — POCT URINALYSIS DIPSTICK
Spec Grav, UA: 1.01 (ref 1.010–1.025)
pH, UA: 6 (ref 5.0–8.0)

## 2021-04-22 LAB — CBC WITH DIFFERENTIAL/PLATELET
Abs Immature Granulocytes: 0.06 10*3/uL (ref 0.00–0.07)
Basophils Absolute: 0.1 10*3/uL (ref 0.0–0.1)
Basophils Relative: 1 %
Eosinophils Absolute: 0.2 10*3/uL (ref 0.0–0.5)
Eosinophils Relative: 2 %
HCT: 47.5 % — ABNORMAL HIGH (ref 36.0–46.0)
Hemoglobin: 15.2 g/dL — ABNORMAL HIGH (ref 12.0–15.0)
Immature Granulocytes: 1 %
Lymphocytes Relative: 26 %
Lymphs Abs: 2.8 10*3/uL (ref 0.7–4.0)
MCH: 28 pg (ref 26.0–34.0)
MCHC: 32 g/dL (ref 30.0–36.0)
MCV: 87.6 fL (ref 80.0–100.0)
Monocytes Absolute: 0.9 10*3/uL (ref 0.1–1.0)
Monocytes Relative: 8 %
Neutro Abs: 6.8 10*3/uL (ref 1.7–7.7)
Neutrophils Relative %: 62 %
Platelets: 281 10*3/uL (ref 150–400)
RBC: 5.42 MIL/uL — ABNORMAL HIGH (ref 3.87–5.11)
RDW: 14.6 % (ref 11.5–15.5)
WBC: 10.9 10*3/uL — ABNORMAL HIGH (ref 4.0–10.5)
nRBC: 0 % (ref 0.0–0.2)

## 2021-04-22 MED ORDER — CIPROFLOXACIN HCL 500 MG PO TABS: 500.0000 mg | ORAL_TABLET | Freq: Two times a day (BID) | ORAL | 0 refills | Status: DC

## 2021-04-22 NOTE — Telephone Encounter (Signed)
Pls tell pt sent in now, sorry for delay   Thx   Dr. Darene Lamer

## 2021-04-22 NOTE — Telephone Encounter (Signed)
Patient aware- see result note

## 2021-04-22 NOTE — Telephone Encounter (Signed)
Patient was seen this morning and told antibiotic would be called in. She states went by pharmacy and they didn't have anything. [facility]

## 2021-04-22 NOTE — Progress Notes (Signed)
Patient ID: Daisy [name], female    DOB: 05-07-1961, 60 y.o.   MRN: [id number]   Chief Complaint  Patient presents with   Urinary Tract Infection   Subjective:    HPI Pt here for hospital follow up on UTI. Pt was running a low grade fever, chills and burning after urination. Pt stayed in [facility]  for 4 days. Pt diagnosed with sepsis. Pt having constant pain and burning.Pt states no energy. Pt states Hgb and platelets were low.  Pt also having right sided back pain. Pt has been drinking plenty of fluids and Tylenol.   Pain in vaginal area and in urethral area. Starting to hurt constantly now. Sharp pains at the urethra.  Nausea and pain in the rt back.   Admit date: 03/30/2021 Discharge date: 04/02/2021 Dx- E.coli sepsis, duet to UTI and sepsis.  Given iv abx and fluids.  Blood cultures positive for E.coli. Urine grew out ESBL E,coli sensistive to rocephin and p.o cipro. Thyroid lab, TSH elevated, needing recheck.  Pt was given ultram 50mg  q6hrs, cipro 500mg , zofran.  Just got married in June and was on honeymoon and this happened after going on honeymoon.  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  Pain mostly in urethral area with burning and sharp pain that is constant.  Medical History Daisy [name] has a past medical history of Chronic pain syndrome, Depression, Fatigue, Fibromyalgia, Normal cardiac stress test (2002), and Thyroid disease.   Outpatient Encounter Medications as of 04/22/2021  Medication Sig   acetaminophen (TYLENOL) 650 MG CR tablet Take 1,300 mg by mouth every 8 (eight) hours as needed for pain.   ciprofloxacin (CIPRO) 500 MG tablet Take 1 tablet (500 mg total) by mouth 2 (two) times daily.   ondansetron (ZOFRAN) 4 MG tablet Take 1 tablet (4 mg total) by mouth every 6 (six) hours as needed for nausea.   polyethylene glycol (MIRALAX / GLYCOLAX) 17 g packet Take 17 g by mouth daily as needed for mild  constipation.   traMADol (ULTRAM) 50 MG tablet Take 1 tablet (50 mg total) by mouth every 6 (six) hours as needed.   [DISCONTINUED] citalopram (CELEXA) 20 MG tablet TAKE 1 TABLET BY MOUTH ONCE DAILY **PATIENT  NEEDS  VIRTUAL  VISIT  IN  SUMMER** (Patient not taking: Reported on 03/30/2021)   [DISCONTINUED] EUTHYROX 50 MCG tablet Take 1 tablet by mouth once daily (Patient not taking: Reported on 03/30/2021)   [DISCONTINUED] gabapentin (NEURONTIN) 100 MG capsule TAKE 1 CAPSULE BY MOUTH THREE TIMES DAILY MAY CAUSE DROWSINESS (Patient not taking: Reported on 03/30/2021)   [DISCONTINUED] topiramate (TOPAMAX) 100 MG tablet Take one tablet by mouth twice daily (Patient not taking: Reported on 03/30/2021)   No facility-administered encounter medications on file as of 04/22/2021.     Review of Systems  Constitutional:  Negative for chills and fever.  HENT:  Negative for congestion, rhinorrhea and sore throat.   Respiratory:  Negative for cough, shortness of breath and wheezing.   Cardiovascular:  Negative for chest pain and leg swelling.  Gastrointestinal:  Negative for abdominal pain, diarrhea, nausea and vomiting.  Genitourinary:  Positive for flank pain (rt back) and pelvic pain (pain at urethra). Negative for difficulty urinating, dysuria, frequency, hematuria, vaginal bleeding, vaginal discharge and vaginal pain.  Musculoskeletal:  Negative for arthralgias and back pain.  Skin:  Negative for rash.  Neurological:  Negative for dizziness, weakness and headaches.  Vitals BP (!) 168/112   Pulse (!) 103   Temp 97.7 F (36.5 C)   Wt 181 lb 9.6 oz (82.4 kg)   SpO2 98%   BMI 31.17 kg/m   Objective:   Physical Exam Vitals and nursing note reviewed.  Constitutional:      General: She is in acute distress (due to pain in urethra).     Appearance: Normal appearance.  HENT:     Head: Normocephalic and atraumatic.     Nose: Nose normal.     Mouth/Throat:     Mouth: Mucous membranes are moist.      Pharynx: Oropharynx is clear.  Eyes:     Extraocular Movements: Extraocular movements intact.     Conjunctiva/sclera: Conjunctivae normal.     Pupils: Pupils are equal, round, and reactive to light.  Cardiovascular:     Rate and Rhythm: Regular rhythm. Tachycardia present.     Pulses: Normal pulses.     Heart sounds: Normal heart sounds.  Pulmonary:     Effort: Pulmonary effort is normal.     Breath sounds: Normal breath sounds. No wheezing, rhonchi or rales.  Genitourinary:    General: Normal vulva.     Comments: Urethra normal. No bleeding, sores, or ulcers. Musculoskeletal:        General: Normal range of motion.     Right lower leg: No edema.     Left lower leg: No edema.  Skin:    General: Skin is warm and dry.     Findings: No lesion or rash.  Neurological:     General: No focal deficit present.     Mental Status: She is alert and oriented to person, place, and time.  Psychiatric:        Mood and Affect: Mood normal.        Behavior: Behavior normal.     Assessment and Plan   1. Sepsis without acute organ dysfunction, due to unspecified organism ([place]) - POCT Urinalysis Dipstick - Urine Culture - CT Abdomen Pelvis Wo Contrast - CBC with Differential - Basic Metabolic Panel (BMET) - ciprofloxacin (CIPRO) 500 MG tablet; Take 1 tablet (500 mg total) by mouth 2 (two) times daily.  Dispense: 14 tablet; Refill: 0  2. Anemia, unspecified type - CBC with Differential - Basic Metabolic Panel (BMET)  3. Abdominal pain, unspecified abdominal location - CT Abdomen Pelvis Wo Contrast - CBC with Differential - Basic Metabolic Panel (BMET)  4. Right flank pain - CT Abdomen Pelvis Wo Contrast - CBC with Differential - Basic Metabolic Panel (BMET) - ciprofloxacin (CIPRO) 500 MG tablet; Take 1 tablet (500 mg total) by mouth 2 (two) times daily.  Dispense: 14 tablet; Refill: 0  5. Other microscopic hematuria - CT Abdomen Pelvis Wo Contrast - CBC with Differential -  Basic Metabolic Panel (BMET)  6. Urethral pain - CT Abdomen Pelvis Wo Contrast - CBC with Differential - Basic Metabolic Panel (BMET)  7. History of acute pyelonephritis - CT Abdomen Pelvis Wo Contrast - CBC with Differential - Basic Metabolic Panel (BMET)   Rt flank pain- ordered CT abd/pelvis- rule out kidney stone.  Anemia- Hb at 10.8 on dc from hospital, was at 12.2 on 03/31/21, initally in ER.  Reviewed urine- +RBC, neg nitrite, LE. On micro- bacteria present. Treated UTI- gave cipro.  Return if symptoms worsen or fail to improve.

## 2021-04-25 LAB — URINE CULTURE

## 2021-04-30 ENCOUNTER — Ambulatory Visit (HOSPITAL_COMMUNITY)
Admission: RE | Admit: 2021-04-30 | Discharge: 2021-04-30 | Disposition: A | Discharge status: Home / Self Care | Payer: Self-pay | Source: Ambulatory Visit | Attending: Family Medicine | Admitting: Family Medicine

## 2021-04-30 ENCOUNTER — Other Ambulatory Visit: Payer: Self-pay

## 2021-04-30 DIAGNOSIS — R9389 Abnormal findings on diagnostic imaging of other specified body structures: Secondary | ICD-10-CM | POA: Insufficient documentation

## 2021-05-01 ENCOUNTER — Other Ambulatory Visit: Payer: Self-pay | Admitting: *Deleted

## 2021-05-01 DIAGNOSIS — R3989 Other symptoms and signs involving the genitourinary system: Secondary | ICD-10-CM

## 2021-05-03 ENCOUNTER — Other Ambulatory Visit: Payer: Self-pay | Admitting: Family Medicine

## 2021-05-03 DIAGNOSIS — N2889 Other specified disorders of kidney and ureter: Secondary | ICD-10-CM

## 2021-05-05 ENCOUNTER — Encounter: Payer: Self-pay | Admitting: Family Medicine

## 2021-05-08 ENCOUNTER — Other Ambulatory Visit: Payer: Self-pay

## 2021-05-08 ENCOUNTER — Ambulatory Visit (INDEPENDENT_AMBULATORY_CARE_PROVIDER_SITE_OTHER): Payer: Self-pay | Admitting: Family Medicine

## 2021-05-08 ENCOUNTER — Encounter: Payer: Self-pay | Admitting: Family Medicine

## 2021-05-08 VITALS — BP 130/68 | Temp 95.5°F | Wt 182.6 lb

## 2021-05-08 DIAGNOSIS — R109 Unspecified abdominal pain: Secondary | ICD-10-CM

## 2021-05-08 LAB — POCT URINALYSIS DIPSTICK
Spec Grav, UA: 1.01 (ref 1.010–1.025)
pH, UA: 5 (ref 5.0–8.0)

## 2021-05-08 MED ORDER — HYDROCODONE-ACETAMINOPHEN 5-325 MG PO TABS: 1.0000 | ORAL_TABLET | Freq: Four times a day (QID) | ORAL | 0 refills | Status: DC | PRN

## 2021-05-08 MED ORDER — ONDANSETRON HCL 4 MG PO TABS: 4.0000 mg | ORAL_TABLET | Freq: Four times a day (QID) | ORAL | 4 refills | Status: DC | PRN

## 2021-05-08 NOTE — Progress Notes (Signed)
   Subjective:    Patient ID: Daisy [name], female    DOB: 02/16/1961, 60 y.o.   MRN: [id number]  HPI Pt here due to ongoing back pain in back that transfers to groin area. Pt also having nausea and some vomiting. Pt would like refill on Zofran.   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

## 2021-05-10 LAB — URINE CULTURE

## 2021-05-14 LAB — URINALYSIS

## 2021-05-14 LAB — URINE CULTURE

## 2021-05-17 ENCOUNTER — Other Ambulatory Visit: Payer: Self-pay

## 2021-05-17 ENCOUNTER — Ambulatory Visit (HOSPITAL_COMMUNITY)
Admission: RE | Admit: 2021-05-17 | Discharge: 2021-05-17 | Disposition: A | Discharge status: Home / Self Care | Payer: Self-pay | Source: Ambulatory Visit | Attending: Family Medicine | Admitting: Family Medicine

## 2021-05-17 DIAGNOSIS — N2889 Other specified disorders of kidney and ureter: Secondary | ICD-10-CM | POA: Insufficient documentation

## 2021-05-17 MED ORDER — GADOBUTROL 1 MMOL/ML IV SOLN
7.0000 mL | Freq: Once | INTRAVENOUS | Status: AC | PRN
  Administered 2021-05-17: 7 mL via INTRAVENOUS

## 2021-05-31 ENCOUNTER — Other Ambulatory Visit: Payer: Self-pay

## 2021-05-31 ENCOUNTER — Ambulatory Visit (INDEPENDENT_AMBULATORY_CARE_PROVIDER_SITE_OTHER): Payer: Self-pay | Admitting: Urology

## 2021-05-31 ENCOUNTER — Encounter: Payer: Self-pay | Admitting: Urology

## 2021-05-31 VITALS — BP 153/93 | HR 105

## 2021-05-31 DIAGNOSIS — N390 Urinary tract infection, site not specified: Secondary | ICD-10-CM

## 2021-05-31 DIAGNOSIS — N2889 Other specified disorders of kidney and ureter: Secondary | ICD-10-CM

## 2021-05-31 LAB — URINALYSIS, ROUTINE W REFLEX MICROSCOPIC
Bilirubin, UA: NEGATIVE
Glucose, UA: NEGATIVE
Ketones, UA: NEGATIVE
Leukocytes,UA: NEGATIVE
Nitrite, UA: NEGATIVE
Protein,UA: NEGATIVE
Specific Gravity, UA: 1.01 (ref 1.005–1.030)
Urobilinogen, Ur: 0.2 mg/dL (ref 0.2–1.0)
pH, UA: 6 (ref 5.0–7.5)

## 2021-05-31 LAB — MICROSCOPIC EXAMINATION
Epithelial Cells (non renal): >10 /hpf — AB (ref 0–10)
Renal Epithel, UA: NONE SEEN /hpf

## 2021-05-31 LAB — BLADDER SCAN AMB NON-IMAGING: Scan Result: 13

## 2021-05-31 MED ORDER — SULFAMETHOXAZOLE-TRIMETHOPRIM 800-160 MG PO TABS: 1.0000 | ORAL_TABLET | Freq: Two times a day (BID) | ORAL | 0 refills | Status: DC

## 2021-05-31 NOTE — Patient Instructions (Signed)
Renal Mass  A renal mass is an abnormal growth in the kidney. It may be found while performing an MRI, CT scan, or ultrasound to evaluate other problems of the abdomen. A renal mass that is cancerous (malignant) may grow or spread quickly. Others are not cancerous (benign). Renal masses include: Tumors. These may be malignant or benign. The most common type of kidney cancer in adults is renal cell carcinoma. In children, the most common type of kidney cancer is Wilms tumor. The most common benign tumors of the kidney include renal adenomas, oncocytomas, and angiomyolipoma (AML). Cysts. These are fluid-filled sacs that form on or in the kidney. What are the causes? Certain types of cancers, infections, or injuries can cause a renal mass. It isnot always known what causes a cyst to develop in or on the kidney. What are the signs or symptoms? Often, a renal mass does not cause any signs or symptoms; most kidney cysts donot cause symptoms. How is this diagnosed? Your health care provider may recommend tests to diagnose the cause of your renal mass. These tests may be done if a renal mass is found: Physical exam. Blood tests. Urine tests. Imaging tests, such as ultrasound, CT scan, or MRI. Biopsy. This is a small sample that is removed from the renal mass and tested in a lab. The exact tests and how often they are done will depend on: The size and appearance of the renal mass. Risk factors or medical conditions that increase your risk for problems. Any symptoms associated with the renal mass, or concerns that you have about it. Tests and physical exams may be done once, or they may be done regularly for a period of time. Tests and exams that are done regularly will help monitorwhether the mass is growing and beginning to cause problems. How is this treated? Treatment is not always needed for this condition. Your health care provider may recommend careful monitoring and regular tests and exams.  Treatment willdepend on the cause of the mass. Treatment for a cancerous renal mass may include surgical removal,chemotherapy, radiation, or immunotherapy. Most kidney cysts do not need to be treated. Follow these instructions at home: What you need to do at home will depend on the cause of the mass. Follow the instructions that your health care provider gives to you. In general: Take over-the-counter and prescription medicines only as told by your health care provider. If you were prescribed an antibiotic medicine, take it as told by your health care provider. Do not stop taking the antibiotic even if you start to feel better. Follow any restrictions that are given to you by your health care provider. Keep all follow-up visits. This is important. You may need to see your health care provider once or twice a year to have CT scans and ultrasounds. These tests will show if your renal mass has changed or grown. Contact a health care provider if you: Have pain in your side or back (flank pain). Have a fever. Feel full soon after eating. Have pain or swelling in the abdomen. Lose weight. Get help right away if: Your pain gets worse. There is blood in your urine. You cannot urinate. You have chest pain. You have trouble breathing. These symptoms may represent a serious problem that is an emergency. Do not wait to see if the symptoms will go away. Get medical help right away. Call your local emergency services (911 in the [place] Summary A renal mass is an abnormal growth in the kidney.   It may be cancerous (malignant) and grow or spread quickly, or it may not be cancerous (benign). Renal masses often do not have any signs or symptoms. Renal masses may be found while performing an MRI, CT scan, or ultrasound for other problems of the abdomen. Your health care provider may recommend that you have tests to diagnose the cause of your renal mass. These may include a physical exam, blood tests, urine  tests, imaging, or a biopsy. Treatment is not always needed for this condition. Careful monitoring may be recommended. This information is not intended to replace advice given to you by your health care provider. Make sure you discuss any questions you have with your healthcare provider. Document Revised: 03/19/2020 Document Reviewed: 03/19/2020 Elsevier Patient Education  2022 [employer].  

## 2021-05-31 NOTE — Progress Notes (Signed)
05/31/2021 12:12 PM   Daisy [name] Aug 27, 1961 [id number]  Referring provider: Kathyrn Drown, MD [place],  [place] [address]  Right renal mass   HPI: Daisy [name] is a 60yo here for evaluation of a right renal mass. She underwent renal US 7/26 which showed bilateral renal cysts and a right renal mass. She then underwent MRI abd 8/12 which showed a 1.1cm right renal mass consistent with a  Bosniak 68F cyst. She was admitted to the hospital with sepsis from urinary source which was treated with multiple courses of cipro. That was her first UTI. UA today is concerning for infection. She has intermittent right flank pain that radiates to her groin.    PMH: Past Medical History:  Diagnosis Date   Chronic pain syndrome    Depression    Fatigue    Fibromyalgia    Normal cardiac stress test 2002   Thyroid disease     Surgical History: Past Surgical History:  Procedure Laterality Date   APPENDECTOMY     CESAREAN SECTION     twice '84 and '89   [place]  2004 and 2005   twice  same site after cholecystectomy   NASAL SINUS SURGERY     twice -enlarged sinuses and cleared scar tissue   TONSILLECTOMY      Home Medications:  Allergies as of 05/31/2021   No Known Allergies      Medication List        Accurate as of May 31, 2021 12:12 PM. If you have any questions, ask your nurse or doctor.          acetaminophen 650 MG CR tablet Commonly known as: TYLENOL Take 1,300 mg by mouth every 8 (eight) hours as needed for pain.   ondansetron 4 MG tablet Commonly known as: ZOFRAN Take 1 tablet (4 mg total) by mouth every 6 (six) hours as needed for nausea.   polyethylene glycol 17 g packet Commonly known as: MIRALAX / GLYCOLAX Take 17 g by mouth daily as needed for mild constipation.        Allergies: No Known Allergies  Family History: Family History  Problem  Relation Age of Onset   Heart disease Father     Social History:  reports that she has been smoking cigarettes. She has been smoking an average of 1 pack per day. She has never used smokeless tobacco. She reports current drug use. Drug: Marijuana. She reports that she does not drink alcohol.  ROS: All other review of systems were reviewed and are negative except what is noted above in HPI  Physical Exam: BP (!) 153/93   Pulse (!) 105   Constitutional:  Alert and oriented, No acute distress. HEENT: [place] AT, moist mucus membranes.  Trachea midline, no masses. Cardiovascular: No clubbing, cyanosis, or edema. Respiratory: Normal respiratory effort, no increased work of breathing. GI: Abdomen is soft, nontender, nondistended, no abdominal masses GU: No CVA tenderness.  Lymph: No cervical or inguinal lymphadenopathy. Skin: No rashes, bruises or suspicious lesions. Neurologic: Grossly intact, no focal deficits, moving all 4 extremities. Psychiatric: Normal mood and affect.  Laboratory Data: Lab Results  Component Value Date   WBC 10.9 (H) 04/22/2021   HGB 15.2 (H) 04/22/2021   HCT 47.5 (H) 04/22/2021   MCV 87.6 04/22/2021   PLT 281 04/22/2021    Lab Results  Component Value Date   CREATININE 1.11 (H) 04/22/2021    No results found for: PSA  No results found for: TESTOSTERONE  No results found for: HGBA1C  Urinalysis    Component Value Date/Time   COLORURINE AMBER (A) 03/30/2021 1521   APPEARANCEUR HAZY (A) 03/30/2021 1521   LABSPEC 1.019 03/30/2021 1521   PHURINE 5.0 03/30/2021 1521   GLUCOSEU CANCELED 05/01/2021 0000   HGBUR LARGE (A) 03/30/2021 1521   BILIRUBINUR NEGATIVE 03/30/2021 1521   KETONESUR NEGATIVE 03/30/2021 1521   PROTEINUR CANCELED 05/01/2021 0000   PROTEINUR 100 (A) 03/30/2021 1521   UROBILINOGEN 0.2 03/08/2015 2027   NITRITE POSITIVE (A) 03/30/2021 1521   LEUKOCYTESUR NEGATIVE 03/30/2021 1521    Lab Results  Component Value Date   BACTERIA FEW  (A) 03/30/2021    Pertinent Imaging: MRI 05/17/2021: Images reviewed and discussed with the patient No results found for this or any previous visit.  No results found for this or any previous visit.  No results found for this or any previous visit.  No results found for this or any previous visit.  Results for orders placed in visit on 04/22/21  US Renal  Narrative CLINICAL DATA:  Follow-up from CT, area on right kidney.  EXAM: RENAL / URINARY TRACT ULTRASOUND COMPLETE  COMPARISON:  None.  FINDINGS: Right Kidney:  Renal measurements: 10.0 x 4.7 x 5 2 cm = volume: 129 mL. Echogenicity within normal limits. Hydronephrosis. Solid lesion of the upper pole of the right kidney with internal calcifications measuring 1.5 x 1 4 x 1.8 cm. Cyst of the lower pole the right kidney measuring 1.3 x 1 1 x 1.1 cm.  Left Kidney:  Renal measurements: 10.6 x 5.8 x 5.2 cm = volume: 168 mL. Echogenicity within normal limits. No hydronephrosis. Cyst of the central mid region the left kidney measuring 3.6 by 3.0 x 3.4 cm. Minimally complex cyst of the peripheral mid region the left kidney associated small calcification measuring 1.8 x 1.3 x 1 7 cm.  Bladder:  Appears normal for degree of bladder distention.  Other:  None.  IMPRESSION: Solid lesion of the upper pole the right kidney measuring to 1.8 cm. Recommend further evaluation with contrast enhanced renal protocol CT or MRI.   Electronically Signed By: Yetta Glassman MD On: 05/02/2021 09:27  No results found for this or any previous visit.  No results found for this or any previous visit.  No results found for this or any previous visit.   Assessment & Plan:    1. Recurrent UTI -Urine for culture. We will start bactrim DS BID for 14 days. RTC 2 weeks withUA - BLADDER SCAN AMB NON-IMAGING - Urinalysis, Routine w reflex microscopic  2. Renal mass We discussed the natural hx of renal masses and the 80/20  malignant/benign likelihood. We disucssed the treatment options including active surveillance. Renal ablation, partial and radical nephrectomy. We will proceed with surveillance. RTC 6 months with MRI   No follow-ups on file.  Nicolette Bang, MD  [facility] [place]

## 2021-05-31 NOTE — Progress Notes (Signed)
Urological Symptom Review PVR 13 Patient is experiencing the following symptoms: Hard to postpone urination Get up at night to urinate Leakage of urine Stream starts and stops   Review of Systems  Gastrointestinal (upper)  : Nausea  Gastrointestinal (lower) : Constipation  Constitutional : Night Sweats Fatigue  Skin: Negative for skin symptoms  Eyes: Negative for eye symptoms  Ear/Nose/Throat : Negative for Ear/Nose/Throat symptoms  Hematologic/Lymphatic: Negative for Hematologic/Lymphatic symptoms  Cardiovascular : Negative for cardiovascular symptoms  Respiratory : Negative for respiratory symptoms  Endocrine: Negative for endocrine symptoms  Musculoskeletal: Back pain Joint pain  Neurological: Headaches  Psychologic: Depression Anxiety

## 2021-06-02 LAB — URINE CULTURE

## 2021-06-14 ENCOUNTER — Other Ambulatory Visit: Payer: Medicaid Other

## 2021-06-14 ENCOUNTER — Other Ambulatory Visit: Payer: Self-pay

## 2021-06-14 ENCOUNTER — Encounter: Payer: Self-pay | Admitting: Urology

## 2021-06-14 DIAGNOSIS — N390 Urinary tract infection, site not specified: Secondary | ICD-10-CM

## 2021-06-14 LAB — URINALYSIS, DIPSTICK ONLY
Bilirubin, UA: NEGATIVE
Glucose, UA: NEGATIVE
Ketones, UA: NEGATIVE
Leukocytes,UA: NEGATIVE
Protein,UA: NEGATIVE
Specific Gravity, UA: 1.01 (ref 1.005–1.030)
Urobilinogen, Ur: 0.2 mg/dL (ref 0.2–1.0)
pH, UA: 5.5 (ref 5.0–7.5)

## 2021-06-18 ENCOUNTER — Telehealth: Payer: Self-pay

## 2021-06-18 NOTE — Telephone Encounter (Signed)
Patient called stating she is still having dysuria and  back pain. Patients ua drop off Friday was negative. Please advise.

## 2021-06-25 NOTE — Telephone Encounter (Signed)
Patient called with no answer. Message left to return call to office. 

## 2021-07-02 NOTE — Telephone Encounter (Signed)
Patient called with no answer. Message left to return call. 

## 2021-07-10 NOTE — Telephone Encounter (Signed)
Patient sent mychart message.

## 2021-08-19 ENCOUNTER — Other Ambulatory Visit: Payer: Medicaid Other

## 2021-08-19 ENCOUNTER — Other Ambulatory Visit: Payer: Self-pay

## 2021-08-19 ENCOUNTER — Telehealth: Payer: Self-pay

## 2021-08-19 DIAGNOSIS — N390 Urinary tract infection, site not specified: Secondary | ICD-10-CM

## 2021-08-19 NOTE — Telephone Encounter (Signed)
Patient called this am with UTI symptoms. Patient will come to office to leave urine sample.

## 2021-08-20 ENCOUNTER — Telehealth: Payer: Self-pay

## 2021-08-20 LAB — URINALYSIS, ROUTINE W REFLEX MICROSCOPIC
Bilirubin, UA: NEGATIVE
Glucose, UA: NEGATIVE
Ketones, UA: NEGATIVE
Leukocytes,UA: NEGATIVE
Nitrite, UA: NEGATIVE
Protein,UA: NEGATIVE
RBC, UA: NEGATIVE
Specific Gravity, UA: 1.017 (ref 1.005–1.030)
Urobilinogen, Ur: 0.2 mg/dL (ref 0.2–1.0)
pH, UA: 5.5 (ref 5.0–7.5)

## 2021-08-20 NOTE — Telephone Encounter (Signed)
Received call from patient stating that she is still having lower back pain, burning, and pain while urinating. Ua is negative. Please advise.

## 2021-08-21 LAB — URINE CULTURE: Organism ID, Bacteria: NO GROWTH

## 2021-08-21 NOTE — Telephone Encounter (Signed)
Patient called and advised to take OTC per Dr. Alyson Ingles.  Patient voiced understanding.

## 2021-10-23 ENCOUNTER — Other Ambulatory Visit: Payer: Self-pay

## 2021-10-23 ENCOUNTER — Telehealth: Payer: Self-pay

## 2021-10-23 ENCOUNTER — Ambulatory Visit (INDEPENDENT_AMBULATORY_CARE_PROVIDER_SITE_OTHER): Payer: Medicaid Other | Admitting: Physician Assistant

## 2021-10-23 ENCOUNTER — Encounter: Payer: Self-pay | Admitting: Physician Assistant

## 2021-10-23 VITALS — BP 167/100 | HR 142 | Ht 64.0 in | Wt 182.0 lb

## 2021-10-23 DIAGNOSIS — R3129 Other microscopic hematuria: Secondary | ICD-10-CM | POA: Diagnosis not present

## 2021-10-23 DIAGNOSIS — S37012A Minor contusion of left kidney, initial encounter: Secondary | ICD-10-CM | POA: Insufficient documentation

## 2021-10-23 DIAGNOSIS — R109 Unspecified abdominal pain: Secondary | ICD-10-CM | POA: Insufficient documentation

## 2021-10-23 DIAGNOSIS — N2889 Other specified disorders of kidney and ureter: Secondary | ICD-10-CM

## 2021-10-23 DIAGNOSIS — N390 Urinary tract infection, site not specified: Secondary | ICD-10-CM | POA: Diagnosis not present

## 2021-10-23 DIAGNOSIS — R030 Elevated blood-pressure reading, without diagnosis of hypertension: Secondary | ICD-10-CM

## 2021-10-23 LAB — URINALYSIS, ROUTINE W REFLEX MICROSCOPIC
Bilirubin, UA: NEGATIVE
Glucose, UA: NEGATIVE
Ketones, UA: NEGATIVE
Leukocytes,UA: NEGATIVE
Nitrite, UA: NEGATIVE
Protein,UA: NEGATIVE
Specific Gravity, UA: 1.02 (ref 1.005–1.030)
Urobilinogen, Ur: 0.2 mg/dL (ref 0.2–1.0)
pH, UA: 5.5 (ref 5.0–7.5)

## 2021-10-23 LAB — MICROSCOPIC EXAMINATION: Renal Epithel, UA: NONE SEEN /hpf

## 2021-10-23 MED ORDER — NITROFURANTOIN MONOHYD MACRO 100 MG PO CAPS: 100.0000 mg | ORAL_CAPSULE | Freq: Two times a day (BID) | ORAL | 0 refills | Status: AC

## 2021-10-23 NOTE — Telephone Encounter (Signed)
Patient complaining of UTI symptoms. Patient placed on schedule today to see PA.

## 2021-10-23 NOTE — Progress Notes (Signed)
Urological Symptom Review  Patient is experiencing the following symptoms: Frequent urination Hard to postpone urination Get up at night to urinate Leakage of urine   Review of Systems  Gastrointestinal (upper)  : Nausea Indigestion/heartburn  Gastrointestinal (lower) : Diarrhea  Constitutional : Fatigue  Skin: Negative for skin symptoms  Eyes: Negative for eye symptoms  Ear/Nose/Throat : Negative for Ear/Nose/Throat symptoms  Hematologic/Lymphatic: Negative for Hematologic/Lymphatic symptoms  Cardiovascular : Negative for cardiovascular symptoms  Respiratory : Negative for respiratory symptoms  Endocrine: Negative for endocrine symptoms  Musculoskeletal: Back pain  Neurological: Negative for neurological symptoms  Psychologic: Negative for psychiatric symptoms

## 2021-10-23 NOTE — Progress Notes (Signed)
Assessment: 1. Recurrent UTI   2. Renal mass   3. Microscopic hematuria   4. Flank pain   5. Elevated blood pressure reading     Plan: Macrobid for suspected UTI and culture ordered. Pt noted to have ongoing microscopic hematuria. Will complete workup with cysto exam-first available. Pt has h/o elevated Bps in past and is advised to FU with PCP for further eval. She declines Zofran Rx for nausea stating she has some at home. Pt will go to ED if she develops worsening pain, fever, or vomiting.  Chief Complaint: No chief complaint on file. "I have another UTI"  HPI: Daisy [name] is a 61 y.o. female who presents for acute evaluation of urinary frequency, urgency, nocturia, and incontinence for approximately 5 days. Pt also reports some nausea without vomiting and right sided flank pain. No fever, chills, hematuria. Pt hx significant for 6/22 hospital admission due to E. Coli urosepsis sens to Rocephin and Cipro.   UA-0-5 WBC, 3-10 RBCs, and few bacteria Nitrite negative  Urine cultures since hospital DC: 05/31/21 Mixed flora fewer than 10K colonies 08/19/21 No growth  05/31/21 Ms Daisy [name] is a 61yo here for evaluation of a right renal mass. She underwent renal US 7/26 which showed bilateral renal cysts and a right renal mass. She then underwent MRI abd 8/12 which showed a 1.1cm right renal mass consistent with a  Bosniak 65F cyst. She was admitted to the hospital with sepsis from urinary source which was treated with multiple courses of cipro. That was her first UTI. UA today is concerning for infection. She has intermittent right flank pain that radiates to her groin.  Portions of the above documentation were copied from a prior visit for review purposes only.  Allergies: No Known Allergies  PMH: Past Medical History:  Diagnosis Date   Chronic pain syndrome    Depression    Fatigue    Fibromyalgia    Normal cardiac stress test 2002   Thyroid disease     PSH: Past Surgical  History:  Procedure Laterality Date   APPENDECTOMY     CESAREAN SECTION     twice '84 and '89   [place]  2004 and 2005   twice  same site after cholecystectomy   NASAL SINUS SURGERY     twice -enlarged sinuses and cleared scar tissue   TONSILLECTOMY      SH: Social History   Tobacco Use   Smoking status: Every Day    Packs/day: 1.00    Types: Cigarettes   Smokeless tobacco: Never  Substance Use Topics   Alcohol use: No   Drug use: Yes    Types: Marijuana    ROS: Constitutional:  Negative for fever, chills, weight loss CV: Negative for chest pain, previous MI, hypertension Respiratory:  Negative for shortness of breath, wheezing, sleep apnea, frequent cough GI:  Negative for vomiting, bloody stool, GERD  PE: BP (!) 167/100    Pulse (!) 142    Ht 5\' 4"  (1.626 m)    Wt 182 lb (82.6 kg)    BMI 31.24 kg/m  GENERAL APPEARANCE:  Well appearing, well developed, well nourished, NAD HEENT:  Atraumatic, normocephalic NECK:  Supple. Trachea midline CV: Pt tachycardic with elevated BP today Chest: Unlabored respirations ABDOMEN:  Soft, non-tender, no masses EXTREMITIES:  Moves all extremities well, without clubbing, cyanosis, or edema NEUROLOGIC:  Alert  and oriented x 3, normal gait, CN II-XII grossly intact MENTAL STATUS:  appropriate BACK:  Non-tender to palpation, Right sided CVAT SKIN:  Warm, dry, and intact   Results:  MR Abdomen W Wo Contrast CLINICAL DATA:  Further characterization of right lower pole renal mass  EXAM: MRI ABDOMEN WITHOUT AND WITH CONTRAST  TECHNIQUE: Multiplanar multisequence MR imaging of the abdomen was performed both before and after the administration of intravenous contrast.  CONTRAST:  64mL GADAVIST GADOBUTROL 1 MMOL/ML IV SOLN  COMPARISON:  CT April 22, 2021.  FINDINGS: Lower chest: No acute findings.  Hepatobiliary: Mild loss of signal on out of phase  imaging in the hepatic parenchyma, consistent with hepatic steatosis. No arterially enhancing hepatic lesion. Gallbladder surgically absent. Mild prominence of the intra and extrahepatic biliary tree, similar prior studies, favored reservoir effect post cholecystectomy.  Pancreas: Intrinsic T1 signal of the pancreatic parenchyma is within normal limits. No pancreatic ductal dilation. No cystic or arterially enhancing solid pancreatic lesion visualized.  Spleen:  Within normal limits.  Adrenals/Urinary Tract:  Bilateral adrenal glands are unremarkable.  No hydronephrosis.  Left kidney: Renal sinus cyst measuring 3.1 cm on image 26/8. Well-circumscribed T2 hyperintense 1.4 cm interpolar lesion with a nodular focus of intrinsic T1 hyperintensity without postcontrast enhancement consistent with a Bosniak classification 2 renal cysts.  Right kidney: In the upper pole there is an 11 mm lesion which heterogeneously T2 hypointense with near homogeneous intrinsic T1 hyperintense signal intensity on image 50/15, with 2-3 thin internal enhancing septations and some questionable nodular postcontrast enhancement on subtraction image 47/20. There are multiple T2 hyperintense lesions scattered throughout the right kidney the preponderance of which demonstrate intrinsic T1 hyperintensity, without postcontrast enhancement, thickened enhancing septations or soft tissue nodularity, for instance a 1.1 cm in the lower pole on image 68/25.  Stomach/Bowel: Visualized portions within the abdomen are unremarkable.  Vascular/Lymphatic: No pathologically enlarged lymph nodes identified. No abdominal aortic aneurysm demonstrated.  Other: Postsurgical change in the anterior abdominal wall. No abdominal ascites.  Musculoskeletal: No suspicious bone lesions identified.  IMPRESSION: 1. Right upper pole 11 mm lesion which is difficult to accurately characterize given its small size, demonstrates near  homogeneous intrinsic T1 hyperintense signal intensity but with 2-3 thin internal enhancing septations and with a questionable nodular focus of postcontrast enhancement. While this lesion is favored to reflect a benign hemorrhagic/proteinaceous cyst, given the small lesions making accurate characterization difficult as well as the multiple septations and possible nodular area of enhancement would make this an indeterminate Bosniak classification 47f renal cyst. Recommend follow-up renal protocol MRI with and without contrast in 6 months to ensure stability. This recommendation follows ACR consensus guidelines: Management of the Incidental Renal Mass on CT: A White Paper of the ACR Incidental Findings Committee. J Am Coll Radiol 2018;15:264-273. 2. Additional bilateral benign Bosniak classification 1-2 cysts bilateral renal cysts. 3. Hepatic steatosis.  Electronically Signed   By: Dahlia Bailiff M.D.   On: 05/20/2021 08:59    No results found for this or any previous visit (from the past 24 hour(s)).

## 2021-12-02 ENCOUNTER — Encounter: Payer: Self-pay | Admitting: Urology

## 2021-12-02 ENCOUNTER — Ambulatory Visit (INDEPENDENT_AMBULATORY_CARE_PROVIDER_SITE_OTHER): Payer: Medicaid Other | Admitting: Urology

## 2021-12-02 ENCOUNTER — Telehealth: Payer: Self-pay

## 2021-12-02 ENCOUNTER — Other Ambulatory Visit: Payer: Self-pay

## 2021-12-02 VITALS — BP 129/87 | HR 112

## 2021-12-02 DIAGNOSIS — N390 Urinary tract infection, site not specified: Secondary | ICD-10-CM | POA: Diagnosis not present

## 2021-12-02 DIAGNOSIS — N2889 Other specified disorders of kidney and ureter: Secondary | ICD-10-CM

## 2021-12-02 LAB — URINALYSIS, ROUTINE W REFLEX MICROSCOPIC
Bilirubin, UA: NEGATIVE
Glucose, UA: NEGATIVE
Ketones, UA: NEGATIVE
Leukocytes,UA: NEGATIVE
Nitrite, UA: NEGATIVE
Protein,UA: NEGATIVE
Specific Gravity, UA: <1.005 — ABNORMAL LOW (ref 1.005–1.030)
Urobilinogen, Ur: 0.2 mg/dL (ref 0.2–1.0)
pH, UA: 5.5 (ref 5.0–7.5)

## 2021-12-02 LAB — MICROSCOPIC EXAMINATION: Renal Epithel, UA: NONE SEEN /hpf

## 2021-12-02 MED ORDER — ONDANSETRON HCL 4 MG PO TABS: 4.0000 mg | ORAL_TABLET | Freq: Four times a day (QID) | ORAL | 1 refills | Status: DC | PRN

## 2021-12-02 MED ORDER — MIRABEGRON ER 25 MG PO TB24: 25.0000 mg | ORAL_TABLET | Freq: Every day | ORAL | 0 refills | Status: DC

## 2021-12-02 NOTE — Progress Notes (Signed)
12/02/2021 11:07 AM   Daisy [name] [birth year] [id number]  Referring provider: Kathyrn Drown, MD [address] [place],  [place] [address]  Followup UTI and renal mass   HPI: Mr Sleeth is a 61yo here for followup for recurrent UTI and right renal mass. She did not get an MRI. She is having urinary urgency, frequency, and severe suprapubic pressure when she needs to urinate. UA today shows 3-10 RBCs. No dysuria or gross hematuria. She has never tried an OAb med for her bladder spasms.    PMH: Past Medical History:  Diagnosis Date   Chronic pain syndrome    Depression    Fatigue    Fibromyalgia    Normal cardiac stress test 2002   Thyroid disease     Surgical History: Past Surgical History:  Procedure Laterality Date   APPENDECTOMY     CESAREAN SECTION     twice '84 and '89   [place]  2004 and 2005   twice  same site after cholecystectomy   NASAL SINUS SURGERY     twice -enlarged sinuses and cleared scar tissue   TONSILLECTOMY      Home Medications:  Allergies as of 12/02/2021   No Known Allergies      Medication List        Accurate as of December 02, 2021 11:07 AM. If you have any questions, ask your nurse or doctor.          acetaminophen 650 MG CR tablet Commonly known as: TYLENOL Take 1,300 mg by mouth every 8 (eight) hours as needed for pain.   ondansetron 4 MG tablet Commonly known as: ZOFRAN Take 1 tablet (4 mg total) by mouth every 6 (six) hours as needed for nausea.   polyethylene glycol 17 g packet Commonly known as: MIRALAX / GLYCOLAX Take 17 g by mouth daily as needed for mild constipation.        Allergies: No Known Allergies  Family History: Family History  Problem Relation Age of Onset   Heart disease Father     Social History:  reports that she has been smoking cigarettes. She has been smoking an average of 1 pack per day. She has  never used smokeless tobacco. She reports current drug use. Drug: Marijuana. She reports that she does not drink alcohol.  ROS: All other review of systems were reviewed and are negative except what is noted above in HPI  Physical Exam: BP 129/87    Pulse (!) 112   Constitutional:  Alert and oriented, No acute distress. HEENT: [place] AT, moist mucus membranes.  Trachea midline, no masses. Cardiovascular: No clubbing, cyanosis, or edema. Respiratory: Normal respiratory effort, no increased work of breathing. GI: Abdomen is soft, nontender, nondistended, no abdominal masses GU: No CVA tenderness.  Lymph: No cervical or inguinal lymphadenopathy. Skin: No rashes, bruises or suspicious lesions. Neurologic: Grossly intact, no focal deficits, moving all 4 extremities. Psychiatric: Normal mood and affect.  Laboratory Data: Lab Results  Component Value Date   WBC 10.9 (H) 04/22/2021   HGB 15.2 (H) 04/22/2021   HCT 47.5 (H) 04/22/2021   MCV 87.6 04/22/2021   PLT 281 04/22/2021    Lab Results  Component Value Date   CREATININE 1.11 (H) 04/22/2021    No results found for: PSA  No results found for: TESTOSTERONE  No results found for: HGBA1C  Urinalysis    Component Value Date/Time   COLORURINE AMBER (A) 03/30/2021 1521   APPEARANCEUR Clear 10/23/2021 1416   LABSPEC 1.019 03/30/2021 1521   PHURINE 5.0 03/30/2021 1521   GLUCOSEU Negative 10/23/2021 1416   HGBUR LARGE (A) 03/30/2021 1521   BILIRUBINUR Negative 10/23/2021 1416   KETONESUR NEGATIVE 03/30/2021 1521   PROTEINUR Negative 10/23/2021 1416   PROTEINUR 100 (A) 03/30/2021 1521   UROBILINOGEN 0.2 03/08/2015 2027   NITRITE Negative 10/23/2021 1416   NITRITE POSITIVE (A) 03/30/2021 1521   LEUKOCYTESUR Negative 10/23/2021 1416   LEUKOCYTESUR NEGATIVE 03/30/2021 1521    Lab Results  Component Value Date   LABMICR See below: 10/23/2021   WBCUA 0-5 10/23/2021   LABEPIT 0-10 10/23/2021   BACTERIA Few 10/23/2021     Pertinent Imaging:  No results found for this or any previous visit.  No results found for this or any previous visit.  No results found for this or any previous visit.  No results found for this or any previous visit.  Results for orders placed in visit on 04/22/21  US Renal  Narrative CLINICAL DATA:  Follow-up from CT, area on right kidney.  EXAM: RENAL / URINARY TRACT ULTRASOUND COMPLETE  COMPARISON:  None.  FINDINGS: Right Kidney:  Renal measurements: 10.0 x 4.7 x 5 2 cm = volume: 129 mL. Echogenicity within normal limits. Hydronephrosis. Solid lesion of the upper pole of the right kidney with internal calcifications measuring 1.5 x 1 4 x 1.8 cm. Cyst of the lower pole the right kidney measuring 1.3 x 1 1 x 1.1 cm.  Left Kidney:  Renal measurements: 10.6 x 5.8 x 5.2 cm = volume: 168 mL. Echogenicity within normal limits. No hydronephrosis. Cyst of the central mid region the left kidney measuring 3.6 by 3.0 x 3.4 cm. Minimally complex cyst of the peripheral mid region the left kidney associated small calcification measuring 1.8 x 1.3 x 1 7 cm.  Bladder:  Appears normal for degree of bladder distention.  Other:  None.  IMPRESSION: Solid lesion of the upper pole the right kidney measuring to 1.8 cm. Recommend further evaluation with contrast enhanced renal protocol CT or MRI.   Electronically Signed By: Yetta Glassman MD On: 05/02/2021 09:27  No results found for this or any previous visit.  No results found for this or any previous visit.  No results found for this or any previous visit.   Assessment & Plan:    1. Renal mass -CT hematuria protocol - Urinalysis, Routine w reflex microscopic  2. Recurrent UTI with microhematuria BMP CT hematuria Cystoscopy  3. Bladder spasms -We will trial mirabegron 25mg  daily   No follow-ups on file.  Nicolette Bang, MD  [facility] [place]

## 2021-12-02 NOTE — Telephone Encounter (Signed)
Received voicemail from patient asking for nausea and pain medications-  Reviewed with Dr. Alyson Ingles, order for nausea medication sent in. Patient was given myrbetriq samples- per Dr. Alyson Ingles that will help with bladder spasms.  Patient notified and voiced understanding,

## 2021-12-02 NOTE — Patient Instructions (Signed)

## 2021-12-02 NOTE — Addendum Note (Signed)
Addended by: Cleon Gustin on: 12/02/2021 01:50 PM   Modules accepted: Orders

## 2021-12-03 LAB — BASIC METABOLIC PANEL
BUN/Creatinine Ratio: 19 (ref 12–28)
BUN: 21 mg/dL (ref 8–27)
CO2: 20 mmol/L (ref 20–29)
Calcium: 9.7 mg/dL (ref 8.7–10.3)
Chloride: 107 mmol/L — ABNORMAL HIGH (ref 96–106)
Creatinine, Ser: 1.11 mg/dL — ABNORMAL HIGH (ref 0.57–1.00)
Glucose: 100 mg/dL — ABNORMAL HIGH (ref 70–99)
Potassium: 4.9 mmol/L (ref 3.5–5.2)
Sodium: 143 mmol/L (ref 134–144)
eGFR: 57 mL/min/{1.73_m2} — ABNORMAL LOW (ref >59)

## 2021-12-09 ENCOUNTER — Other Ambulatory Visit: Payer: Self-pay

## 2021-12-09 ENCOUNTER — Encounter (HOSPITAL_BASED_OUTPATIENT_CLINIC_OR_DEPARTMENT_OTHER): Payer: Self-pay

## 2021-12-09 ENCOUNTER — Ambulatory Visit (HOSPITAL_BASED_OUTPATIENT_CLINIC_OR_DEPARTMENT_OTHER)
Admission: RE | Admit: 2021-12-09 | Discharge: 2021-12-09 | Disposition: A | Discharge status: Home / Self Care | Payer: Medicaid Other | Source: Ambulatory Visit | Attending: Urology | Admitting: Urology

## 2021-12-09 DIAGNOSIS — N2889 Other specified disorders of kidney and ureter: Secondary | ICD-10-CM

## 2021-12-09 DIAGNOSIS — N390 Urinary tract infection, site not specified: Secondary | ICD-10-CM | POA: Insufficient documentation

## 2021-12-09 MED ORDER — IOHEXOL 300 MG/ML  SOLN
100.0000 mL | Freq: Once | INTRAMUSCULAR | Status: AC | PRN
  Administered 2021-12-09: 85 mL via INTRAVENOUS

## 2021-12-10 ENCOUNTER — Encounter: Payer: Self-pay | Admitting: Family Medicine

## 2021-12-10 DIAGNOSIS — R160 Hepatomegaly, not elsewhere classified: Secondary | ICD-10-CM

## 2021-12-10 NOTE — Telephone Encounter (Signed)
Nurses ?I did review over the CAT scan ?You may share this note with Mirella ? ?Nurses ?Patient does need CMP as well as CBC because of hepatomegaly. ?Hepatomegaly basically means the liver is enlarged.  We need to help determine if there is fatty liver versus even the possibility of cirrhosis.  Not enough information on the CAT scan to make this determination.  I recommend lab work plus also office visit.  Then based upon this we may also need to get gastroenterology involved. ?Please set up lab work and follow-up office visit thank you ?

## 2021-12-13 LAB — COMPREHENSIVE METABOLIC PANEL
ALT: 14 IU/L (ref 0–32)
AST: 17 IU/L (ref 0–40)
Albumin/Globulin Ratio: 1.4 (ref 1.2–2.2)
Albumin: 4.4 g/dL (ref 3.8–4.9)
Alkaline Phosphatase: 86 IU/L (ref 44–121)
BUN/Creatinine Ratio: 18 (ref 12–28)
BUN: 21 mg/dL (ref 8–27)
Bilirubin Total: <0.2 mg/dL (ref 0.0–1.2)
CO2: 20 mmol/L (ref 20–29)
Calcium: 9.7 mg/dL (ref 8.7–10.3)
Chloride: 103 mmol/L (ref 96–106)
Creatinine, Ser: 1.16 mg/dL — ABNORMAL HIGH (ref 0.57–1.00)
Globulin, Total: 3.1 g/dL (ref 1.5–4.5)
Glucose: 126 mg/dL — ABNORMAL HIGH (ref 70–99)
Potassium: 4.1 mmol/L (ref 3.5–5.2)
Sodium: 142 mmol/L (ref 134–144)
Total Protein: 7.5 g/dL (ref 6.0–8.5)
eGFR: 54 mL/min/{1.73_m2} — ABNORMAL LOW (ref >59)

## 2021-12-13 LAB — CBC WITH DIFFERENTIAL/PLATELET
Basophils Absolute: 0.1 10*3/uL (ref 0.0–0.2)
Basos: 1 %
EOS (ABSOLUTE): 0.2 10*3/uL (ref 0.0–0.4)
Eos: 1 %
Hematocrit: 46.4 % (ref 34.0–46.6)
Hemoglobin: 15.5 g/dL (ref 11.1–15.9)
Immature Grans (Abs): 0.1 10*3/uL (ref 0.0–0.1)
Immature Granulocytes: 1 %
Lymphocytes Absolute: 2.8 10*3/uL (ref 0.7–3.1)
Lymphs: 21 %
MCH: 27.5 pg (ref 26.6–33.0)
MCHC: 33.4 g/dL (ref 31.5–35.7)
MCV: 82 fL (ref 79–97)
Monocytes Absolute: 0.7 10*3/uL (ref 0.1–0.9)
Monocytes: 6 %
Neutrophils Absolute: 9.2 10*3/uL — ABNORMAL HIGH (ref 1.4–7.0)
Neutrophils: 70 %
Platelets: 287 10*3/uL (ref 150–450)
RBC: 5.63 x10E6/uL — ABNORMAL HIGH (ref 3.77–5.28)
RDW: 14.6 % (ref 11.7–15.4)
WBC: 13.1 10*3/uL — ABNORMAL HIGH (ref 3.4–10.8)

## 2021-12-16 ENCOUNTER — Encounter: Payer: Self-pay | Admitting: Family Medicine

## 2021-12-16 ENCOUNTER — Emergency Department (HOSPITAL_COMMUNITY): Payer: Self-pay

## 2021-12-16 ENCOUNTER — Ambulatory Visit (INDEPENDENT_AMBULATORY_CARE_PROVIDER_SITE_OTHER): Payer: Self-pay | Admitting: Family Medicine

## 2021-12-16 ENCOUNTER — Encounter (HOSPITAL_COMMUNITY): Payer: Self-pay

## 2021-12-16 ENCOUNTER — Inpatient Hospital Stay (HOSPITAL_COMMUNITY)
Admission: EM | Admit: 2021-12-16 | Discharge: 2021-12-20 | DRG: 871 | Disposition: A | Discharge status: Home / Self Care | Payer: Self-pay | Attending: Internal Medicine | Admitting: Internal Medicine

## 2021-12-16 ENCOUNTER — Other Ambulatory Visit: Payer: Self-pay

## 2021-12-16 VITALS — BP 109/74 | HR 107 | Temp 97.3°F | Wt 187.6 lb

## 2021-12-16 DIAGNOSIS — M7981 Nontraumatic hematoma of soft tissue: Secondary | ICD-10-CM | POA: Diagnosis present

## 2021-12-16 DIAGNOSIS — K5730 Diverticulosis of large intestine without perforation or abscess without bleeding: Secondary | ICD-10-CM | POA: Diagnosis present

## 2021-12-16 DIAGNOSIS — N3281 Overactive bladder: Secondary | ICD-10-CM | POA: Diagnosis present

## 2021-12-16 DIAGNOSIS — G894 Chronic pain syndrome: Secondary | ICD-10-CM | POA: Diagnosis present

## 2021-12-16 DIAGNOSIS — Z20822 Contact with and (suspected) exposure to covid-19: Secondary | ICD-10-CM | POA: Diagnosis present

## 2021-12-16 DIAGNOSIS — N179 Acute kidney failure, unspecified: Secondary | ICD-10-CM

## 2021-12-16 DIAGNOSIS — F17210 Nicotine dependence, cigarettes, uncomplicated: Secondary | ICD-10-CM | POA: Diagnosis present

## 2021-12-16 DIAGNOSIS — F17201 Nicotine dependence, unspecified, in remission: Secondary | ICD-10-CM

## 2021-12-16 DIAGNOSIS — A419 Sepsis, unspecified organism: Secondary | ICD-10-CM

## 2021-12-16 DIAGNOSIS — Z23 Encounter for immunization: Secondary | ICD-10-CM

## 2021-12-16 DIAGNOSIS — M797 Fibromyalgia: Secondary | ICD-10-CM | POA: Diagnosis present

## 2021-12-16 DIAGNOSIS — Q649 Congenital malformation of urinary system, unspecified: Secondary | ICD-10-CM

## 2021-12-16 DIAGNOSIS — Z79899 Other long term (current) drug therapy: Secondary | ICD-10-CM

## 2021-12-16 DIAGNOSIS — Z8249 Family history of ischemic heart disease and other diseases of the circulatory system: Secondary | ICD-10-CM

## 2021-12-16 DIAGNOSIS — S37012A Minor contusion of left kidney, initial encounter: Secondary | ICD-10-CM

## 2021-12-16 DIAGNOSIS — R109 Unspecified abdominal pain: Principal | ICD-10-CM

## 2021-12-16 DIAGNOSIS — R0902 Hypoxemia: Secondary | ICD-10-CM

## 2021-12-16 DIAGNOSIS — N151 Renal and perinephric abscess: Secondary | ICD-10-CM | POA: Diagnosis present

## 2021-12-16 DIAGNOSIS — R1084 Generalized abdominal pain: Secondary | ICD-10-CM

## 2021-12-16 DIAGNOSIS — R319 Hematuria, unspecified: Secondary | ICD-10-CM | POA: Diagnosis present

## 2021-12-16 DIAGNOSIS — J449 Chronic obstructive pulmonary disease, unspecified: Secondary | ICD-10-CM | POA: Diagnosis present

## 2021-12-16 DIAGNOSIS — N390 Urinary tract infection, site not specified: Secondary | ICD-10-CM | POA: Diagnosis present

## 2021-12-16 DIAGNOSIS — N1831 Chronic kidney disease, stage 3a: Secondary | ICD-10-CM | POA: Diagnosis present

## 2021-12-16 DIAGNOSIS — Z720 Tobacco use: Secondary | ICD-10-CM

## 2021-12-16 DIAGNOSIS — A4151 Sepsis due to Escherichia coli [E. coli]: Principal | ICD-10-CM | POA: Diagnosis present

## 2021-12-16 LAB — CBC
HCT: 41.9 % (ref 36.0–46.0)
Hemoglobin: 13.3 g/dL (ref 12.0–15.0)
MCH: 27.9 pg (ref 26.0–34.0)
MCHC: 31.7 g/dL (ref 30.0–36.0)
MCV: 88 fL (ref 80.0–100.0)
Platelets: 234 K/uL (ref 150–400)
RBC: 4.76 MIL/uL (ref 3.87–5.11)
RDW: 14.8 % (ref 11.5–15.5)
WBC: 16.8 K/uL — ABNORMAL HIGH (ref 4.0–10.5)
nRBC: 0 % (ref 0.0–0.2)

## 2021-12-16 LAB — LIPASE, BLOOD: Lipase: 24 U/L (ref 11–51)

## 2021-12-16 LAB — COMPREHENSIVE METABOLIC PANEL WITH GFR
ALT: 15 U/L (ref 0–44)
AST: 12 U/L — ABNORMAL LOW (ref 15–41)
Albumin: 3.7 g/dL (ref 3.5–5.0)
Alkaline Phosphatase: 63 U/L (ref 38–126)
Anion gap: 6 (ref 5–15)
BUN: 25 mg/dL — ABNORMAL HIGH (ref 6–20)
CO2: 28 mmol/L (ref 22–32)
Calcium: 8.5 mg/dL — ABNORMAL LOW (ref 8.9–10.3)
Chloride: 100 mmol/L (ref 98–111)
Creatinine, Ser: 1.36 mg/dL — ABNORMAL HIGH (ref 0.44–1.00)
GFR, Estimated: 45 mL/min — ABNORMAL LOW (ref >60)
Glucose, Bld: 126 mg/dL — ABNORMAL HIGH (ref 70–99)
Potassium: 4.7 mmol/L (ref 3.5–5.1)
Sodium: 134 mmol/L — ABNORMAL LOW (ref 135–145)
Total Bilirubin: 0.8 mg/dL (ref 0.3–1.2)
Total Protein: 7.7 g/dL (ref 6.5–8.1)

## 2021-12-16 LAB — URINALYSIS, ROUTINE W REFLEX MICROSCOPIC
Bilirubin Urine: NEGATIVE
Glucose, UA: NEGATIVE mg/dL
Ketones, ur: NEGATIVE mg/dL
Nitrite: NEGATIVE
Protein, ur: 100 mg/dL — AB
Specific Gravity, Urine: 1.019 (ref 1.005–1.030)
pH: 6 (ref 5.0–8.0)

## 2021-12-16 LAB — RESP PANEL BY RT-PCR (FLU A&B, COVID) ARPGX2
Influenza A by PCR: NEGATIVE
Influenza B by PCR: NEGATIVE
SARS Coronavirus 2 by RT PCR: NEGATIVE

## 2021-12-16 LAB — LACTIC ACID, PLASMA
Lactic Acid, Venous: 1.2 mmol/L (ref 0.5–1.9)
Lactic Acid, Venous: 1.9 mmol/L (ref 0.5–1.9)

## 2021-12-16 MED ORDER — POLYETHYLENE GLYCOL 3350 17 G PO PACK
17.0000 g | PACK | Freq: Every day | ORAL | Status: DC
  Administered 2021-12-16 – 2021-12-20 (×4): 17 g via ORAL
  Filled 2021-12-16 (×5): qty 1

## 2021-12-16 MED ORDER — LACTATED RINGERS IV BOLUS (SEPSIS)
1000.0000 mL | Freq: Once | INTRAVENOUS | Status: AC
  Administered 2021-12-16: 1000 mL via INTRAVENOUS

## 2021-12-16 MED ORDER — METRONIDAZOLE 500 MG/100ML IV SOLN
500.0000 mg | Freq: Once | INTRAVENOUS | Status: AC
  Administered 2021-12-16: 500 mg via INTRAVENOUS
  Filled 2021-12-16: qty 100

## 2021-12-16 MED ORDER — IOHEXOL 300 MG/ML  SOLN
100.0000 mL | Freq: Once | INTRAMUSCULAR | Status: AC | PRN
  Administered 2021-12-16: 100 mL via INTRAVENOUS

## 2021-12-16 MED ORDER — PNEUMOCOCCAL 20-VAL CONJ VACC 0.5 ML IM SUSY
0.5000 mL | PREFILLED_SYRINGE | INTRAMUSCULAR | Status: AC
  Administered 2021-12-17: 0.5 mL via INTRAMUSCULAR
  Filled 2021-12-16: qty 0.5

## 2021-12-16 MED ORDER — ACETAMINOPHEN 325 MG PO TABS
650.0000 mg | ORAL_TABLET | Freq: Four times a day (QID) | ORAL | Status: DC | PRN
  Administered 2021-12-16 – 2021-12-18 (×6): 650 mg via ORAL
  Filled 2021-12-16 (×6): qty 2

## 2021-12-16 MED ORDER — ONDANSETRON HCL 4 MG/2ML IJ SOLN
4.0000 mg | Freq: Once | INTRAMUSCULAR | Status: AC
  Administered 2021-12-16: 4 mg via INTRAVENOUS
  Filled 2021-12-16: qty 2

## 2021-12-16 MED ORDER — VANCOMYCIN HCL 1500 MG/300ML IV SOLN
1500.0000 mg | INTRAVENOUS | Status: AC
  Administered 2021-12-16: 1500 mg via INTRAVENOUS
  Filled 2021-12-16: qty 300

## 2021-12-16 MED ORDER — MORPHINE SULFATE (PF) 4 MG/ML IV SOLN
4.0000 mg | Freq: Once | INTRAVENOUS | Status: AC
  Administered 2021-12-16: 4 mg via INTRAVENOUS
  Filled 2021-12-16: qty 1

## 2021-12-16 MED ORDER — VANCOMYCIN HCL 1250 MG/250ML IV SOLN
1250.0000 mg | INTRAVENOUS | Status: DC
  Administered 2021-12-17: 1250 mg via INTRAVENOUS
  Filled 2021-12-16: qty 250

## 2021-12-16 MED ORDER — INFLUENZA VAC SPLIT QUAD 0.5 ML IM SUSY
0.5000 mL | PREFILLED_SYRINGE | INTRAMUSCULAR | Status: DC
  Filled 2021-12-16: qty 0.5

## 2021-12-16 MED ORDER — ACETAMINOPHEN 650 MG RE SUPP: 650.0000 mg | Freq: Four times a day (QID) | RECTAL | Status: DC | PRN

## 2021-12-16 MED ORDER — ONDANSETRON HCL 4 MG PO TABS
4.0000 mg | ORAL_TABLET | Freq: Four times a day (QID) | ORAL | Status: DC | PRN
  Administered 2021-12-16 – 2021-12-19 (×4): 4 mg via ORAL
  Filled 2021-12-16 (×4): qty 1

## 2021-12-16 MED ORDER — SODIUM CHLORIDE 0.9 % IV SOLN
2.0000 g | Freq: Once | INTRAVENOUS | Status: AC
  Administered 2021-12-16: 2 g via INTRAVENOUS
  Filled 2021-12-16: qty 20

## 2021-12-16 MED ORDER — LACTATED RINGERS IV SOLN: INTRAVENOUS | Status: DC

## 2021-12-16 MED ORDER — BISACODYL 5 MG PO TBEC: 5.0000 mg | DELAYED_RELEASE_TABLET | Freq: Every day | ORAL | Status: DC | PRN

## 2021-12-16 MED ORDER — MORPHINE SULFATE (PF) 2 MG/ML IV SOLN
2.0000 mg | Freq: Once | INTRAVENOUS | Status: AC
  Administered 2021-12-16: 2 mg via INTRAVENOUS
  Filled 2021-12-16: qty 1

## 2021-12-16 MED ORDER — MIRABEGRON ER 25 MG PO TB24
25.0000 mg | ORAL_TABLET | Freq: Every day | ORAL | Status: DC
  Administered 2021-12-17 – 2021-12-20 (×4): 25 mg via ORAL
  Filled 2021-12-16 (×4): qty 1

## 2021-12-16 MED ORDER — LACTATED RINGERS IV SOLN: INTRAVENOUS | Status: AC

## 2021-12-16 NOTE — H&P (Signed)
?History and Physical  ? ? ?Patient: Daisy [name] MWU:[id number] DOB: [birth year] ?DOA: 12/16/2021 ?DOS: the patient was seen and examined on 12/16/2021 ?PCP: Kathyrn Drown, MD  ?Patient coming from: Home ? ?Chief Complaint:  ?Chief Complaint  ?Patient presents with  ? Abdominal Pain  ? ?HPI: Daisy [name] is a 61 y.o. female with medical history significant of frequent UTIs, chronic pain syndrome, depression, fibromyalgia. ? ?Patient presented from home to ED on 12/16/21 after appointment with her PCP who recommended presenting to the ED.  ?She states that she has been having abdominal pain in LLQ that is severe in nature and has been present for a couple days. She describes the pain to similar to her previous kidney infection on the right side.  ?Denies SOB, chest pain, dysuria. ?Endorses nausea, anorexia, fever. ?On recent abdominal CT scan, she was found to have a right renal cyst.  ? ?In the ED: ?Significant findings include: Cr 1.36 (from normal baseline), Na+ 134, K+ 4.7, WBC 16.8, urinalysis positive for hematuria, casts, LA negative x2, respiratory panel negative.  ?CT abdomen showed left subcapsular renal hematoma- which was reviewed by urologist, Dr. Alyson Ingles who described the finding as more consistent with an abscess, per report from [place]. Also, mild colonic diverticulosis without acute diverticulitis.  ?Initial vitals significant for Tmax 103, heart rate up to 124, BP 149/70, and requiring 2L [place]. No oxygen use at baseline and acquired this new requirement after receiving morphine.  ? ?They received morphine, zofran, miralax, 3L LR boluses, followed by maintenance fluids, flagyl, tylenol.  ?  ?Review of Systems: As mentioned in the history of present illness. All other systems reviewed and are negative. ?Past Medical History:  ?Diagnosis Date  ? Chronic pain syndrome   ? Depression   ? Fatigue   ? Fibromyalgia   ? Normal cardiac stress test 2002  ? Thyroid disease   ? ?Past Surgical History:   ?Procedure Laterality Date  ? APPENDECTOMY    ? CESAREAN SECTION    ? twice '84 and '89  ? CHOLECYSTECTOMY    ? ECTOPIC PREGNANCY SURGERY    ? FINGER SURGERY    ? HERNIA REPAIR  2004 and 2005  ? twice  same site after cholecystectomy  ? NASAL SINUS SURGERY    ? twice -enlarged sinuses and cleared scar tissue  ? TONSILLECTOMY    ? ?Social History:  reports that she has been smoking cigarettes. She has been smoking an average of 1 pack per day. She has never used smokeless tobacco. She reports current drug use. Drug: Marijuana. She reports that she does not drink alcohol. ? ?No Known Allergies ? ?Family History  ?Problem Relation Age of Onset  ? Heart disease Father   ? ? ?Prior to Admission medications   ?Medication Sig Start Date End Date Taking? Authorizing Provider  ?acetaminophen (TYLENOL) 650 MG CR tablet Take 1,300 mg by mouth every 8 (eight) hours as needed for pain.   Yes [provider]  ?mirabegron ER (MYRBETRIQ) 25 MG TB24 tablet Take 1 tablet (25 mg total) by mouth daily. 12/02/21  Yes McKenzie, Candee Furbish, MD  ?ondansetron (ZOFRAN) 4 MG tablet Take 1 tablet (4 mg total) by mouth every 6 (six) hours as needed for nausea. 12/02/21  Yes McKenzie, Candee Furbish, MD  ?polyethylene glycol (MIRALAX / GLYCOLAX) 17 g packet Take 17 g by mouth daily as needed for mild constipation. 04/02/21  Yes Annita Brod, MD  ? ? ?Physical Exam: ?Vitals:  ?  12/16/21 1916 12/16/21 1930 12/16/21 2020 12/16/21 2100  ?BP:  (!) 144/73 (!) 149/70 112/60  ?Pulse:  (!) 112 (!) 124 99  ?Resp:  '18 18 19  '$ ?Temp:  (!) 103 ?F (39.4 ?C) (!) 102.6 ?F (39.2 ?C) 100 ?F (37.8 ?C)  ?TempSrc:  Oral Oral   ?SpO2: 95% 93% 95% 93%  ?Weight:   85.7 kg   ?Height:   '5\' 9"'$  (1.753 m)   ? ?Physical Exam ?Vitals and nursing note reviewed. Exam conducted with a chaperone present.  ?Constitutional:   ?   General: She is not in acute distress. ?   Appearance: She is obese. She is not ill-appearing or diaphoretic.  ?HENT:  ?   Head: Normocephalic.   ?Cardiovascular:  ?   Rate and Rhythm: Regular rhythm. Tachycardia present.  ?   Heart sounds: Normal heart sounds.  ?Pulmonary:  ?   Effort: Pulmonary effort is normal.  ?   Breath sounds: Normal breath sounds.  ?Abdominal:  ?   General: Abdomen is protuberant. Bowel sounds are normal. There is no distension.  ?   Palpations: Abdomen is soft.  ?   Tenderness: There is generalized abdominal tenderness.  ?Skin: ?   General: Skin is warm and dry.  ?   Capillary Refill: Capillary refill takes less than 2 seconds.  ?   Findings: No rash.  ?Neurological:  ?   General: No focal deficit present.  ?   Mental Status: She is alert and oriented to person, place, and time.  ?Psychiatric:     ?   Mood and Affect: Mood normal.     ?   Behavior: Behavior normal.  ? ? ?Data Reviewed: ?Significant findings include: Cr 1.36 (from normal baseline), Na+ 134, K+ 4.7, WBC 16.8, urinalysis positive for hematuria, casts, LA negative x2, respiratory panel negative.  ?CT abdomen showed left subcapsular renal hematoma- which was reviewed by urologist, Dr. Alyson Ingles who described the finding as more consistent with an abscess, per report from [place]. Also, mild colonic diverticulosis without acute diverticulitis.  ?Initial vitals significant for Tmax 103, heart rate up to 124, BP 149/70, and requiring 2L [place]. No oxygen use at baseline and acquired this new requirement after receiving morphine.  ? ?Assessment and Plan: ?Daisy [name] is a 61 y.o. female with medical history significant of frequent UTIs, chronic pain syndrome, depression, fibromyalgia. ?Patient presented from home to ED on 12/16/21 after appointment with her PCP who recommended presenting to the ED. She presented with abdominal pain thought to be related to finding on abdominal CT which is consistent with abscess and correlates with patient febrile.  ? ?Sepsis- renal/urinary source. Tmax 103 in ED. Started on flagyl, rocephin, and vancomycin. Positive hematuria on urinalysis. LA  negative x2.  ?- urology following, appreciate recs ?- renal US  ?- analgesia PRN ?- continue Abx as above, per pharmacy ?- continue IV maintenance fluids ?- CBC am ?- f/u cultures ?- antiemetics ? ?AKI- Cr 1.36 from normal baseline ?- BMP am ?- avoid nephrotoxic agents ?- mIVF ? ? ? ? Advance Care Planning:   Code Status: Full Code  ? ?Consults: urology  ? ?Family Communication: husband at bedside ? ?Severity of Illness: ?The appropriate patient status for this patient is INPATIENT. Inpatient status is judged to be reasonable and necessary in order to provide the required intensity of service to ensure the patient's safety. The patient's presenting symptoms, physical exam findings, and initial radiographic and laboratory data in the context of  their chronic comorbidities is felt to place them at high risk for further clinical deterioration. Furthermore, it is not anticipated that the patient will be medically stable for discharge from the hospital within 2 midnights of admission.  ? ?* I certify that at the point of admission it is my clinical judgment that the patient will require inpatient hospital care spanning beyond 2 midnights from the point of admission due to high intensity of service, high risk for further deterioration and high frequency of surveillance required.* ? ?Author: ?Richarda Osmond, MD ?12/16/2021 10:16 PM ? ?For on call review [email or web address].  ?

## 2021-12-16 NOTE — ED Provider Notes (Cosign Needed)
[facility] EMERGENCY DEPARTMENT Provider Note   CSN: [id number] Arrival date & time: 12/16/21  1122     History  Chief Complaint  Patient presents with   Abdominal Pain    Daisy [name] is a 61 y.o. female.   Abdominal Pain Associated symptoms: nausea and vomiting   Associated symptoms: no chest pain, no diarrhea and no shortness of breath       Daisy [name] is a 61 y.o. female who presents to the Emergency Department complaining of left flank and abdominal pain since yesterday.  She describes constant pain to her flank area that is nonradiating.  She was febrile yesterday at 101.6.  She endorses some intermittent nausea and vomiting as well.  She was treated 2 weeks ago for urinary tract infection in which she had symptoms on the right side.  She notes having some dark-colored urine this morning but states she has not been eating or drinking due to her current symptoms.  Last significant meal was Saturday.  She states she had a CT scan of her abdomen on 12/09/2021 ordered by her urologist.  She was noted to have diverticulosis on her CT scan without diverticulitis.     Home Medications Prior to Admission medications   Medication Sig Start Date End Date Taking? Authorizing Provider  acetaminophen (TYLENOL) 650 MG CR tablet Take 1,300 mg by mouth every 8 (eight) hours as needed for pain.   Yes [provider]  mirabegron ER (MYRBETRIQ) 25 MG TB24 tablet Take 1 tablet (25 mg total) by mouth daily. 12/02/21  Yes McKenzie, Candee Furbish, MD  ondansetron (ZOFRAN) 4 MG tablet Take 1 tablet (4 mg total) by mouth every 6 (six) hours as needed for nausea. 12/02/21  Yes McKenzie, Candee Furbish, MD  polyethylene glycol (MIRALAX / GLYCOLAX) 17 g packet Take 17 g by mouth daily as needed for mild constipation. 04/02/21  Yes Annita Brod, MD      Allergies    Patient has no known allergies.    Review of Systems   Review of Systems  Constitutional:  Positive for appetite change.   Respiratory:  Negative for shortness of breath.   Cardiovascular:  Negative for chest pain.  Gastrointestinal:  Positive for abdominal pain, nausea and vomiting. Negative for diarrhea.  Genitourinary:  Positive for flank pain. Negative for difficulty urinating, frequency and pelvic pain.  Musculoskeletal:  Negative for back pain.  Neurological:  Negative for dizziness, weakness and numbness.   Physical Exam Updated Vital Signs BP (!) 149/74    Pulse (!) 102    Temp 98.8 F (37.1 C) (Oral)    Resp 16    Ht '5\' 3"'$  (1.6 m)    Wt 84.8 kg    SpO2 93%    BMI 33.13 kg/m  Physical Exam Vitals and nursing note reviewed.  Constitutional:      Appearance: She is well-developed.     Comments: Patient is uncomfortable appearing  HENT:     Mouth/Throat:     Mouth: Mucous membranes are moist.  Cardiovascular:     Rate and Rhythm: Normal rate and regular rhythm.     Pulses: Normal pulses.  Pulmonary:     Effort: Pulmonary effort is normal.     Breath sounds: Normal breath sounds.  Chest:     Chest wall: No tenderness.  Abdominal:     General: There is no distension.     Palpations: Abdomen is soft. There is no mass.  Tenderness: There is abdominal tenderness. There is left CVA tenderness. There is no guarding.     Comments: Tenderness of the left flank and left lower quadrant area.  There is some left CVA tenderness on exam.  Musculoskeletal:        General: Normal range of motion.  Skin:    General: Skin is warm.     Capillary Refill: Capillary refill takes less than 2 seconds.     Findings: No rash.  Neurological:     General: No focal deficit present.     Mental Status: She is alert.     Sensory: No sensory deficit.     Motor: No weakness.    ED Results / Procedures / Treatments   Labs (all labs ordered are listed, but only abnormal results are displayed) Labs Reviewed  COMPREHENSIVE METABOLIC PANEL - Abnormal; Notable for the following components:      Result Value    Sodium 134 (*)    Glucose, Bld 126 (*)    BUN 25 (*)    Creatinine, Ser 1.36 (*)    Calcium 8.5 (*)    AST 12 (*)    GFR, Estimated 45 (*)    All other components within normal limits  CBC - Abnormal; Notable for the following components:   WBC 16.8 (*)    All other components within normal limits  URINALYSIS, ROUTINE W REFLEX MICROSCOPIC - Abnormal; Notable for the following components:   APPearance HAZY (*)    Hgb urine dipstick MODERATE (*)    Protein, ur 100 (*)    Leukocytes,Ua SMALL (*)    Bacteria, UA MANY (*)    All other components within normal limits  CULTURE, BLOOD (SINGLE)  RESP PANEL BY RT-PCR (FLU A&B, COVID) ARPGX2  URINE CULTURE  LIPASE, BLOOD  LACTIC ACID, PLASMA  LACTIC ACID, PLASMA    EKG None  Radiology CT ABDOMEN PELVIS W CONTRAST  Result Date: 12/16/2021 CLINICAL DATA:  Left lower quadrant abdominal pain Sepsis Nausea and vomiting EXAM: CT ABDOMEN AND PELVIS WITH CONTRAST TECHNIQUE: Multidetector CT imaging of the abdomen and pelvis was performed using the standard protocol following bolus administration of intravenous contrast. RADIATION DOSE REDUCTION: This exam was performed according to the departmental dose-optimization program which includes automated exposure control, adjustment of the mA and/or kV according to patient size and/or use of iterative reconstruction technique. CONTRAST:  137m OMNIPAQUE IOHEXOL 300 MG/ML  SOLN COMPARISON:  12/09/2021 FINDINGS: Lower chest: No acute abnormality. Hepatobiliary: No focal liver abnormality is seen. Status post cholecystectomy. No biliary dilatation. Pancreas: Unremarkable. No pancreatic ductal dilatation or surrounding inflammatory changes. Spleen: Normal in size without focal abnormality. Adrenals/Urinary Tract: Adrenal glands are normal. Known Bosniak 90F right upper pole lesion is not well visualized on this current examination. Additional subcentimeter right renal hypodensities are too small to fully  characterize, however most likely also represent simple cysts. Interval development of left subcapsular renal hematoma. Small amount of fat stranding also noted in the left paracolic gutter/retroperitoneum. Ureters are unremarkable. Diffuse bladder wall thickening likely due to under distension. Chronic cystitis and outlet obstruction can have a similar appearance. Stomach/Bowel: Mild distal colonic diverticulosis without evidence of acute diverticulitis. No bowel dilatation to indicate ileus or obstruction. Appendix is not definitively identified. Vascular/Lymphatic: No significant vascular findings are present. No enlarged abdominal or pelvic lymph nodes. Reproductive: Uterus and bilateral adnexa are unremarkable. Other: Surgical mesh material noted in the anterior abdominal wall. Musculoskeletal: Advanced degenerative changes of the lower lumbar spine.  No acute osseous abnormality. No acute osseous abnormality. IMPRESSION: 1. Left subcapsular renal hematoma. 2. Mild colonic diverticulosis without evidence of acute diverticulitis. These results were called by telephone at the time of interpretation on 12/16/2021 at 4:14 pm to provider [facility] Bevin Das , who verbally acknowledged these results. Electronically Signed   By: Miachel Roux M.D.   On: 12/16/2021 16:15    Procedures Procedures    Medications Ordered in ED Medications  lactated ringers infusion ( Intravenous New Bag/Given 12/16/21 1616)  lactated ringers bolus 1,000 mL (1,000 mLs Intravenous New Bag/Given 12/16/21 1610)    And  lactated ringers bolus 1,000 mL (1,000 mLs Intravenous New Bag/Given 12/16/21 1613)    And  lactated ringers bolus 1,000 mL (has no administration in time range)  metroNIDAZOLE (FLAGYL) IVPB 500 mg (500 mg Intravenous New Bag/Given 12/16/21 1612)  cefTRIAXone (ROCEPHIN) 2 g in sodium chloride 0.9 % 100 mL IVPB (0 g Intravenous Stopped 12/16/21 1613)  ondansetron (ZOFRAN) injection 4 mg (4 mg Intravenous Given 12/16/21 1525)   morphine (PF) 4 MG/ML injection 4 mg (4 mg Intravenous Given 12/16/21 1525)  iohexol (OMNIPAQUE) 300 MG/ML solution 100 mL (100 mLs Intravenous Contrast Given 12/16/21 1601)    ED Course/ Medical Decision Making/ A&P                           Medical Decision Making Patient here for evaluation of left flank and left lower quadrant pain.  Onset yesterday.  Febrile yesterday with max temp of 101.6 at home.  She notes having some nausea and vomiting as well.  Describes pain is constant.  Notes history of recent urinary tract infection treated with antibiotics 2 weeks ago.  She had CT of the abdomen and pelvis on 12/09/2021 with evidence of diverticulosis.  On exam, patient is uncomfortable appearing.  She is afebrile but tachycardic.  Differential at this time would include pyelonephritis, acute diverticulitis, other intra-abdominal process.  This may represent a evolving sepsis  Amount and/or Complexity of Data Reviewed External Data Reviewed: labs.    Details: Prior medical records reviewed by me including CT from 12/09/2021 Labs: ordered.    Details: Labs interpreted by me, show urinalysis with moderate hemoglobin, mild proteinuria, small leukocytes 11-20 red cells and 21-50 white cells with many bacteria.  Squamous cells are also present.  This may represent contaminated urine.  Urine culture pending, significant leukocytosis with white count of greater than 16,000, chemistries show blood sugar of 126.  BUN slightly elevated at 25 creatinine 1.36, this is elevated from her baseline.  She does appear mildly dehydrated.  Initial lactic acid is reassuring.  COVID and flu test are negative, lipase unremarkable Radiology: ordered.    Details: CT abdomen and pelvis ordered on 12/09/2021 was reviewed by me, since patient has recent left flank lower quadrant pain I feel that repeat imaging is warranted.  We will proceed with repeat CT abdomen pelvis  I was contacted by radiologist, presence of left  subcapsular renal hematoma.  Will discuss further with urology. Discussion of management or test interpretation with external provider(s): Discussed findings with urology, Dr. Alyson Ingles who feels findings likely represent abscess versus hematoma.  He request patient be admitted to hospitalist service he will see in consultation and patient will likely need renal ultrasound tomorrow.  Agreeable to continue with IV Rocephin.  Discussed findings with [facility] hospitalist, Dr. Ouida Sills who agrees to admit.  Risk Prescription drug management. Decision regarding hospitalization.  Patient here with left flank left lower quadrant pain onset yesterday.  Initially, felt to evolving sepsis.  Sepsis protocol initiated patient given LR and IV antibiotics.  CT abdomen pelvis shows renal hematoma.  On further questioning, patient denies any recent trauma or fall.  There is no ecchymosis of the flank.  She does not take blood thinners.  1700 I was notified by nursing staff that patient's O2 sat dropped to 88% on room air, she was given IV morphine approximately 1.5 hours ago.  She is getting IV fluids.  She was placed on 2 L O2 by nasal cannula, current O2 saturation is 93%.         Final Clinical Impression(s) / ED Diagnoses Final diagnoses:  Flank pain, acute    Rx / DC Orders ED Discharge Orders     None         Kem Parkinson, PA-C 12/16/21 1843

## 2021-12-16 NOTE — Progress Notes (Addendum)
Pharmacy Antibiotic Note ? ?Daisy [name] is a 61 y.o. female admitted on 12/16/2021 with sepsis.  Pharmacy has been consulted for Vancomycin dosing. ? ?Plan: ?Vancomycin '1500mg'$  IV once, then '1250mg'$  IV q 24h ?Monitor renal function, culture data and clinical status. ? ?Height: '5\' 9"'$  (175.3 cm) ?Weight: 85.7 kg (189 lb) ?IBW/kg (Calculated) : 66.2 ?Cal AUC 476 ? ?Temp (24hrs), Avg:100.4 ?F (38 ?C), Min:97.3 ?F (36.3 ?C), Max:103 ?F (39.4 ?C) ? ?Recent Labs  ?Lab 12/11/21 ?1357 12/16/21 ?1155 12/16/21 ?1456 12/16/21 ?1649  ?WBC 13.1* 16.8*  --   --   ?CREATININE 1.16* 1.36*  --   --   ?LATICACIDVEN  --   --  1.2 1.9  ?  ?Estimated Creatinine Clearance: 51.4 mL/min (A) (by C-G formula based on SCr of 1.36 mg/dL (H)).   ? ?No Known Allergies ? ?Antimicrobials this admission: ?3/13 Rocephin x1 ?3/13 Metronidazole x1 ? ?Microbiology results: ?3/13 Bld (single) >> ?3/13 Ucx >> ? ? ?Thank you for allowing pharmacy to be a part of this patient?s care. ? ?Ferdinand Lango ?12/16/2021 9:31 PM ? ?

## 2021-12-16 NOTE — ED Notes (Signed)
MD at bedside. 

## 2021-12-16 NOTE — ED Notes (Signed)
Patients O2 sats 88% on RA. Patient placed on 2L [place]. PA made aware.  ?

## 2021-12-16 NOTE — ED Notes (Signed)
Patient transported to CT 

## 2021-12-16 NOTE — ED Triage Notes (Signed)
Patient reports abdominal pain with nausea/vomiting for the past few days. PCP concerned for Infection per patient. States that she has been running fever.  ?

## 2021-12-16 NOTE — Progress Notes (Signed)
? ?  Subjective:  ? ? Patient ID: Daisy [name], female    DOB: Oct 17, 1960, 61 y.o.   MRN: [id number] ? ?HPI ?Pt had abdominal swelling and pain yesterday. Pt had fever of 101.6, vomiting and diarrhea. Pt states she did have some mucus in diarrhea and foul smell. Pt states LUQ is sore and tender to touch. Unable to eat or sleep.  ?She relates severe abdominal pain and discomfort worse on the left side aches severely kept her awake all night long ? ?Had recent CT scan of follow-up a complex renal cyst which showed some diverticula ?Also showed some potential liver nodularity ?Recent lab work last week shows chronic kidney disease as well as normal liver enzymes ? ?Review of Systems ? ?   ?Objective:  ? Physical Exam ? ?General-in no acute distress ?Eyes-no discharge ?Lungs-respiratory rate normal, CTA ?CV-no murmurs,RRR ?Extremities skin warm dry no edema ?Neuro grossly normal ?Behavior normal, alert ?Abd- tender tender throughout, worse on the left side, some rebound, ?Shared discussion with the patient and she agrees that ER would be the best route ? ?   ?Assessment & Plan:  ?With patient stating that she is unable to keep anything down along with fever and severe tenderness she needs ER evaluation ?Referral to the ER for IV fluids antibiotics and CT scan ?Possibility of diverticulitis ?We will follow along if she is discharged later today we will provide close follow-up ?If she is kept in the hospital we will provide follow-up after her admission and discharge ?ER charge nurse was spoken with they are expecting her ?

## 2021-12-17 DIAGNOSIS — Z720 Tobacco use: Secondary | ICD-10-CM

## 2021-12-17 DIAGNOSIS — A419 Sepsis, unspecified organism: Secondary | ICD-10-CM

## 2021-12-17 DIAGNOSIS — N281 Cyst of kidney, acquired: Secondary | ICD-10-CM

## 2021-12-17 DIAGNOSIS — R0902 Hypoxemia: Secondary | ICD-10-CM

## 2021-12-17 DIAGNOSIS — F17201 Nicotine dependence, unspecified, in remission: Secondary | ICD-10-CM

## 2021-12-17 DIAGNOSIS — S37092A Other injury of left kidney, initial encounter: Secondary | ICD-10-CM

## 2021-12-17 HISTORY — DX: Sepsis, unspecified organism: A41.9

## 2021-12-17 LAB — BASIC METABOLIC PANEL
Anion gap: 7 (ref 5–15)
BUN: 22 mg/dL (ref 8–23)
CO2: 27 mmol/L (ref 22–32)
Calcium: 7.8 mg/dL — ABNORMAL LOW (ref 8.9–10.3)
Chloride: 103 mmol/L (ref 98–111)
Creatinine, Ser: 1.48 mg/dL — ABNORMAL HIGH (ref 0.44–1.00)
GFR, Estimated: 40 mL/min — ABNORMAL LOW (ref >60)
Glucose, Bld: 112 mg/dL — ABNORMAL HIGH (ref 70–99)
Potassium: 4.5 mmol/L (ref 3.5–5.1)
Sodium: 137 mmol/L (ref 135–145)

## 2021-12-17 LAB — CBC
HCT: 37.5 % (ref 36.0–46.0)
Hemoglobin: 11.4 g/dL — ABNORMAL LOW (ref 12.0–15.0)
MCH: 26.8 pg (ref 26.0–34.0)
MCHC: 30.4 g/dL (ref 30.0–36.0)
MCV: 88 fL (ref 80.0–100.0)
Platelets: 193 10*3/uL (ref 150–400)
RBC: 4.26 MIL/uL (ref 3.87–5.11)
RDW: 14.6 % (ref 11.5–15.5)
WBC: 17.7 10*3/uL — ABNORMAL HIGH (ref 4.0–10.5)
nRBC: 0 % (ref 0.0–0.2)

## 2021-12-17 MED ORDER — SODIUM CHLORIDE 0.9 % IV SOLN
2.0000 g | Freq: Two times a day (BID) | INTRAVENOUS | Status: DC
  Administered 2021-12-17 – 2021-12-19 (×5): 2 g via INTRAVENOUS
  Filled 2021-12-17 (×5): qty 2

## 2021-12-17 MED ORDER — SODIUM CHLORIDE 0.9 % IV SOLN: INTRAVENOUS | Status: DC

## 2021-12-17 MED ORDER — KETOROLAC TROMETHAMINE 15 MG/ML IJ SOLN
15.0000 mg | Freq: Once | INTRAMUSCULAR | Status: AC
  Administered 2021-12-17: 15 mg via INTRAVENOUS
  Filled 2021-12-17: qty 1

## 2021-12-17 NOTE — Consult Note (Signed)
Urology Consult  ?Referring physician: Dr. Carles Collet  ?Reason for referral: Left renal abscess/left renal subcapsular hematoma ? ?Chief Complaint: Left flank pain ? ?History of Present Illness: Daisy [name] is a 61yo with a history of recurrent UTI and a right renal cyst who was admitted least night with fever, left flank pain and a left renal subcapsular hematoma. 5 days ago she developed new urinary urgency, urinary frequency and dysuria. 2 days ago she developed left flank pain. The pain is sharp, constant moderate to severe and nonraditing. She denies gross hematuria. She developed a fever 2 days ago. She has associated nausea and vomiting. WBC 17.7, creatinine 1.48.  UA is concerning for infection. She had a fever 103.2 last night. CT abd/pelvis from yesterday shows a 4cm area in the mid pole of the left kidney concerning for possible abscess and a left subcapsular hematoma. Of note her CT abd/pelvis from 3/6 showed a stable right renal cyst and no left renal abnormalities.  ? ?Past Medical History:  ?Diagnosis Date  ? Chronic pain syndrome   ? Depression   ? Fatigue   ? Fibromyalgia   ? Normal cardiac stress test 2002  ? Thyroid disease   ? ?Past Surgical History:  ?Procedure Laterality Date  ? APPENDECTOMY    ? CESAREAN SECTION    ? twice '84 and '89  ? CHOLECYSTECTOMY    ? ECTOPIC PREGNANCY SURGERY    ? FINGER SURGERY    ? HERNIA REPAIR  2004 and 2005  ? twice  same site after cholecystectomy  ? NASAL SINUS SURGERY    ? twice -enlarged sinuses and cleared scar tissue  ? TONSILLECTOMY    ? ? ?Medications: I have reviewed the patient's current medications. ?Allergies: No Known Allergies ? ?Family History  ?Problem Relation Age of Onset  ? Heart disease Father   ? ?Social History:  reports that she has been smoking cigarettes. She has been smoking an average of 1 pack per day. She has never used smokeless tobacco. She reports current drug use. Drug: Marijuana. She reports that she does not drink alcohol. ? ?Review of  Systems  ?Gastrointestinal:  Positive for nausea and vomiting.  ?Genitourinary:  Positive for dysuria, frequency and urgency.  ?All other systems reviewed and are negative. ? ?Physical Exam:  ?Vital signs in last 24 hours: ?Temp:  [97.3 ?F (36.3 ?C)-103.2 ?F (39.6 ?C)] 98.2 ?F (36.8 ?C) (03/14 0831) ?Pulse Rate:  [95-127] 95 (03/14 0831) ?Resp:  [16-22] 18 (03/14 0831) ?BP: (105-157)/(57-87) 110/69 (03/14 0831) ?SpO2:  [88 %-99 %] 94 % (03/14 0831) ?FiO2 (%):  [28 %] 28 % (03/13 1916) ?Weight:  [84.8 kg-85.7 kg] 85.7 kg (03/13 2020) ?Physical Exam ?Vitals reviewed.  ?Constitutional:   ?   Appearance: She is well-developed.  ?HENT:  ?   Head: Normocephalic and atraumatic.  ?Eyes:  ?   Extraocular Movements: Extraocular movements intact.  ?   Pupils: Pupils are equal, round, and reactive to light.  ?Cardiovascular:  ?   Rate and Rhythm: Normal rate and regular rhythm.  ?Pulmonary:  ?   Effort: Pulmonary effort is normal. No respiratory distress.  ?Abdominal:  ?   General: Abdomen is flat. There is no distension.  ?   Tenderness: There is left CVA tenderness.  ?Skin: ?   General: Skin is warm and dry.  ?Neurological:  ?   General: No focal deficit present.  ?   Mental Status: She is alert and oriented to person, place, and time.  ?  Psychiatric:     ?   Mood and Affect: Mood normal.     ?   Behavior: Behavior normal.  ? ? ?Laboratory Data:  ?Results for orders placed or performed during the hospital encounter of 12/16/21 (from the past 72 hour(s))  ?Lipase, blood     Status: None  ? Collection Time: 12/16/21 11:55 AM  ?Result Value Ref Range  ? Lipase 24 11 - 51 U/L  ?  Comment: Performed at [facility], [address], [place], [place] [address]  ?Comprehensive metabolic panel     Status: Abnormal  ? Collection Time: 12/16/21 11:55 AM  ?Result Value Ref Range  ? Sodium 134 (L) 135 - 145 mmol/L  ? Potassium 4.7 3.5 - 5.1 mmol/L  ? Chloride 100 98 - 111 mmol/L  ? CO2 28 22 - 32 mmol/L  ? Glucose, Bld 126 (H) 70 - 99 mg/dL   ?  Comment: Glucose reference range applies only to samples taken after fasting for at least 8 hours.  ? BUN 25 (H) 6 - 20 mg/dL  ? Creatinine, Ser 1.36 (H) 0.44 - 1.00 mg/dL  ? Calcium 8.5 (L) 8.9 - 10.3 mg/dL  ? Total Protein 7.7 6.5 - 8.1 g/dL  ? Albumin 3.7 3.5 - 5.0 g/dL  ? AST 12 (L) 15 - 41 U/L  ? ALT 15 0 - 44 U/L  ? Alkaline Phosphatase 63 38 - 126 U/L  ? Total Bilirubin 0.8 0.3 - 1.2 mg/dL  ? GFR, Estimated 45 (L) >60 mL/min  ?  Comment: (NOTE) ?Calculated using the CKD-EPI Creatinine Equation (2021) ?  ? Anion gap 6 5 - 15  ?  Comment: Performed at [facility], [address], [place], [place] [address]  ?CBC     Status: Abnormal  ? Collection Time: 12/16/21 11:55 AM  ?Result Value Ref Range  ? WBC 16.8 (H) 4.0 - 10.5 K/uL  ? RBC 4.76 3.87 - 5.11 MIL/uL  ? Hemoglobin 13.3 12.0 - 15.0 g/dL  ? HCT 41.9 36.0 - 46.0 %  ? MCV 88.0 80.0 - 100.0 fL  ? MCH 27.9 26.0 - 34.0 pg  ? MCHC 31.7 30.0 - 36.0 g/dL  ? RDW 14.8 11.5 - 15.5 %  ? Platelets 234 150 - 400 K/uL  ? nRBC 0.0 0.0 - 0.2 %  ?  Comment: Performed at [facility], [address] [place], [place] [address]  ?Urinalysis, Routine w reflex microscopic Urine, Clean Catch     Status: Abnormal  ? Collection Time: 12/16/21 11:55 AM  ?Result Value Ref Range  ? Color, Urine YELLOW YELLOW  ? APPearance HAZY (A) CLEAR  ? Specific Gravity, Urine 1.019 1.005 - 1.030  ? pH 6.0 5.0 - 8.0  ? Glucose, UA NEGATIVE NEGATIVE mg/dL  ? Hgb urine dipstick MODERATE (A) NEGATIVE  ? Bilirubin Urine NEGATIVE NEGATIVE  ? Ketones, ur NEGATIVE NEGATIVE mg/dL  ? Protein, ur 100 (A) NEGATIVE mg/dL  ? Nitrite NEGATIVE NEGATIVE  ? Leukocytes,Ua SMALL (A) NEGATIVE  ? RBC / HPF 11-20 0 - 5 RBC/hpf  ? WBC, UA 21-50 0 - 5 WBC/hpf  ? Bacteria, UA MANY (A) NONE SEEN  ? Squamous Epithelial / LPF 11-20 0 - 5  ? Mucus PRESENT   ? Hyaline Casts, UA PRESENT   ?  Comment: Performed at [facility], [address], [place], [place] [address]  ?Lactic acid, plasma     Status: None  ? Collection Time:  12/16/21  2:56 PM  ?  Result Value Ref Range  ? Lactic Acid, Venous 1.2 0.5 - 1.9 mmol/L  ?  Comment: Performed at [facility], [address] [place], [place] [address]  ?Culture, blood (single)     Status: None (Preliminary result)  ? Collection Time: 12/16/21  2:57 PM  ? Specimen: Left Antecubital; Blood  ?Result Value Ref Range  ? Specimen Description LEFT ANTECUBITAL   ? Special Requests    ?  BOTTLES DRAWN AEROBIC AND ANAEROBIC Blood Culture adequate volume ?Performed at [facility], [address], [place], [place] [address] ?  ? Culture PENDING   ? Report Status PENDING   ?Resp Panel by RT-PCR (Flu A&B, Covid) Nasopharyngeal Swab     Status: None  ? Collection Time: 12/16/21  2:57 PM  ? Specimen: Nasopharyngeal Swab; Nasopharyngeal(NP) swabs in vial transport medium  ?Result Value Ref Range  ? SARS Coronavirus 2 by RT PCR NEGATIVE NEGATIVE  ?  Comment: (NOTE) ?SARS-CoV-2 target nucleic acids are NOT DETECTED. ? ?The SARS-CoV-2 RNA is generally detectable in upper respiratory ?specimens during the acute phase of infection. The lowest ?concentration of SARS-CoV-2 viral copies this assay can detect is ?138 copies/mL. A negative result does not preclude SARS-Cov-2 ?infection and should not be used as the sole basis for treatment or ?other patient management decisions. A negative result may occur with  ?improper specimen collection/handling, submission of specimen other ?than nasopharyngeal swab, presence of viral mutation(s) within the ?areas targeted by this assay, and inadequate number of viral ?copies(<138 copies/mL). A negative result must be combined with ?clinical observations, patient history, and epidemiological ?information. The expected result is Negative. ? ?Fact Sheet for Patients:  ?EntrepreneurPulse.com.au ? ?Fact Sheet for Healthcare Providers:  ?IncredibleEmployment.be ? ?This test is no t yet approved or cleared by the [place] FDA and  ?has been authorized  for detection and/or diagnosis of SARS-CoV-2 by ?FDA under an Emergency Use Authorization (EUA). This EUA will remain  ?in effect (meaning this test can be used) for the duration of the ?COVID-19 de

## 2021-12-17 NOTE — Progress Notes (Addendum)
Pharmacy Antibiotic Note [name] is a 61 y.o. female admitted on 12/16/2021 with sepsis.  Pharmacy has been consulted for Cefepime dosing. ? ?Plan: ?Cefepime 2gm IV q12h ?Continue Vancomycin '1250mg'$  IV q24h ? Goal AUC 400-550. ?Expected AUC: 521 ?SCr used: 1.48  ?F/U cxs and clinical progress ?Monitor V/S,labs ? ?Height: '5\' 9"'$  (175.3 cm) ?Weight: 85.7 kg (189 lb) ?IBW/kg (Calculated) : 66.2 ? ?Temp (24hrs), Avg:101 ?F (38.3 ?C), Min:97.3 ?F (36.3 ?C), Max:103.2 ?F (39.6 ?C) ? ?Recent Labs  ?Lab 12/11/21 ?1357 12/16/21 ?1155 12/16/21 ?1456 12/16/21 ?1649 12/17/21 ?0454  ?WBC 13.1* 16.8*  --   --  17.7*  ?CREATININE 1.16* 1.36*  --   --  1.48*  ?LATICACIDVEN  --   --  1.2 1.9  --   ?  ?Estimated Creatinine Clearance: 46.6 mL/min (A) (by C-G formula based on SCr of 1.48 mg/dL (H)).   ? ?No Known Allergies ? ?Antimicrobials this admission: ?cefepime 3/14 >>  ?Vancomycin 3/13>> ?Ceftriaxone and flagyl 3/13 x 1 dose ? ?Microbiology results: ?3/13 BCx: pending ?3/13 UCx: pending  ? ?Thank you for allowing pharmacy to be a part of this patientDaisys care. ? ?Daisy [name], BS Pharm D, BCPS ?Clinical Pharmacist ?Pager [phone] ?12/17/2021 8:27 AM ? ?

## 2021-12-17 NOTE — TOC Progression Note (Signed)
?  Transition of Care (TOC) Screening Note ? ? ?Patient Details  ?Name: Daisy [name] ?Date of Birth: 07/04/1961 ? ? ?Transition of Care (TOC) CM/SW Contact:    ?Shade Flood, LCSW ?Phone Number: ?12/17/2021, 11:35 AM ? ? ? ?Received update that pt's husband requesting information on assistance with pt's hospital bill. Referred to [facility] in financial counseling who stated she would contact pt/husband to review options. ? ?Transition of Care Department [facility]) has reviewed patient and no TOC needs have been identified at this time. We will continue to monitor patient advancement through interdisciplinary progression rounds. If new patient transition needs arise, please place a TOC consult. ? ? ?

## 2021-12-17 NOTE — Assessment & Plan Note (Addendum)
Patient noted to have oxygen saturation 88% on room air ?She is not particularly short of breath ?Stable on 2 L presently ?Suspect the patient has underlying COPD ?She has over 80-pack-year history and continues to smoke 2 packs/day ?Currently appears to be weaned down to room air ?

## 2021-12-17 NOTE — Progress Notes (Addendum)
?  ?       ?PROGRESS NOTE ? ?Daisy [name] ERD:[id number] DOB: [birth year] DOA: 12/16/2021 ?PCP: Kathyrn Drown, MD ? ?Brief History:  ?61 year old female with a history of fibromyalgia, overactive bladder, and tobacco abuse presenting with 1 day history of left lower quadrant and left flank pain that started acutely on 12/15/2021.  The patient had been in her usual state of health up until the early afternoon 12/15/2021.  The patient denies any recent injury or trauma.  She had fevers up to 101.6 ?F at home with nausea and vomiting.  She has had urinary urgency and dysuria.  In addition, the patient has had some loose stools but denies any hematochezia or melena.  The patient had been following urology, Dr. Alyson Ingles.  She had recently been started on Myrbetriq.  There was no other new medicines.  She has not been on any antibiotics recently.  She denies any headache, chest pain, shortness of breath, cough, hemoptysis.  She continues to smoke 2 packs/day. ?In the ED, the patient was febrile to 103.2 ?F.  She was tachycardic in the 110s.  She was hemodynamically stable.  Oxygen saturation was 88% on room air.  She was placed on 2 L with saturation up to 95%.  CT of the abdomen and pelvis showed a known right complex renal cyst.  There is a new left subcapsular renal hematoma with a small amount of fat stranding in the ?Left paracolic gutter.  Bilateral ureters were unremarkable.  There is diffuse wall thickening of the urinary bladder.  The patient was started on vancomycin, ceftriaxone, and metronidazole.  Urology, Dr. Alyson Ingles was consulted.  ? ? ?Assessment and Plan: ?* Sepsis due to undetermined organism [facility]) ?Secondary to urinary source ?EDP spoke with Dr. Alyson Ingles who felt that left renal abnormality may represent abscess ?Urology consult ?Lactic acid 1.2>>1.9 ?Continue vancomycin ?Start cefepime ?UA 21-50 WBC ?Follow blood and urine culture data  ?continue IV fluids ? ?Renal hematoma, left ?Concerned about  underlying abscess/infected hematoma ?Urology consult ?Continue vancomycin and cefepime pending culture data ? ?Hypoxia ?Patient noted to have oxygen saturation 88% on room air ?She is not particularly short of breath ?Stable on 2 L presently ?Suspect the patient has underlying COPD ?She has over 80-pack-year history and continues to smoke 2 packs/day ? ?Acute renal failure superimposed on stage 3a chronic kidney disease ([place]) ?Baseline creatinine 0.9-1.1 ?Serum creatinine peaking 1.48 ?Continue IV fluids ? ?Tobacco abuse ?Tobacco cessation discussed ? ? ? ? ? ? ? ? ?Status is: Inpatient ?Remains inpatient appropriate because: severity of illness requiring urology consult and IV abx ? ? ? ?Family Communication:  no Family at bedside ? ?Consultants:  urology, Dr. Alyson Ingles ? ?Code Status:  FULL  ? ?DVT Prophylaxis:  SCDs ? ? ?Procedures: ?As Listed in Progress Note Above ? ?Antibiotics: ?Vanco 3/13>> ?Cefepime 3/14>> ? ? ? ? ? ?Subjective: ?Patient continues to have pain in the left flank but it is a little better than yesterday.  She has some nausea without emesis.  She denies any headache, chest pain, shortness of breath, cough, hemoptysis.  There is no diarrhea or hematochezia or melena. ? ?Objective: ?Vitals:  ? 12/16/21 2245 12/16/21 2353 12/17/21 0112 12/17/21 0523  ?BP: 129/60 126/70 (!) 105/57 120/63  ?Pulse: (!) 108 (!) 112 (!) 127 (!) 109  ?Resp: 20  (!) 22 (!) 21  ?Temp: 99.8 ?F (37.7 ?C) (!) 103.2 ?F (39.6 ?C) (!) 100.8 ?F (38.2 ?C) (!) 103.1 ?F (39.5 ?C)  ?  TempSrc: Oral   Oral  ?SpO2: 91% 91% 95% 90%  ?Weight:      ?Height:      ? ? ?Intake/Output Summary (Last 24 hours) at 12/17/2021 0751 ?Last data filed at 12/17/2021 0300 ?Gross per 24 hour  ?Intake 3575 ml  ?Output --  ?Net 3575 ml  ? ?Weight change:  ?Exam: ? ?General:  Pt is alert, follows commands appropriately, not in acute distress ?HEENT: No icterus, No thrush, No neck mass, [place]/AT ?Cardiovascular: RRR, S1/S2, no rubs, no gallops ?Respiratory:  Bibasilar crackles.  No wheezing.  Good air movement ?Abdomen: Soft/+BS, LLQ tender, left flank tender.  Non distended, no guarding ?Extremities: No edema, No lymphangitis, No petechiae, No rashes, no synovitis ? ? ?Data Reviewed: ?I have personally reviewed following labs and imaging studies ?Basic Metabolic Panel: ?Recent Labs  ?Lab 12/11/21 ?1357 12/16/21 ?1155 12/17/21 ?0454  ?NA 142 134* 137  ?K 4.1 4.7 4.5  ?CL 103 100 103  ?CO2 '20 28 27  '$ ?GLUCOSE 126* 126* 112*  ?BUN 21 25* 22  ?CREATININE 1.16* 1.36* 1.48*  ?CALCIUM 9.7 8.5* 7.8*  ? ?Liver Function Tests: ?Recent Labs  ?Lab 12/11/21 ?1357 12/16/21 ?1155  ?AST 17 12*  ?ALT 14 15  ?ALKPHOS 86 63  ?BILITOT <0.2 0.8  ?PROT 7.5 7.7  ?ALBUMIN 4.4 3.7  ? ?Recent Labs  ?Lab 12/16/21 ?1155  ?LIPASE 24  ? ?No results for input(s): AMMONIA in the last 168 hours. ?Coagulation Profile: ?No results for input(s): INR, PROTIME in the last 168 hours. ?CBC: ?Recent Labs  ?Lab 12/11/21 ?1357 12/16/21 ?1155 12/17/21 ?0454  ?WBC 13.1* 16.8* 17.7*  ?NEUTROABS 9.2*  --   --   ?HGB 15.5 13.3 11.4*  ?HCT 46.4 41.9 37.5  ?MCV 82 88.0 88.0  ?PLT 287 234 193  ? ?Cardiac Enzymes: ?No results for input(s): CKTOTAL, CKMB, CKMBINDEX, TROPONINI in the last 168 hours. ?BNP: ?Invalid input(s): POCBNP ?CBG: ?No results for input(s): GLUCAP in the last 168 hours. ?HbA1C: ?No results for input(s): HGBA1C in the last 72 hours. ?Urine analysis: ?   ?Component Value Date/Time  ? COLORURINE YELLOW 12/16/2021 1155  ? APPEARANCEUR HAZY (A) 12/16/2021 1155  ? APPEARANCEUR Clear 12/02/2021 1123  ? LABSPEC 1.019 12/16/2021 1155  ? PHURINE 6.0 12/16/2021 1155  ? GLUCOSEU NEGATIVE 12/16/2021 1155  ? HGBUR MODERATE (A) 12/16/2021 1155  ? [place] NEGATIVE 12/16/2021 1155  ? BILIRUBINUR Negative 12/02/2021 1123  ? [place] NEGATIVE 12/16/2021 1155  ? PROTEINUR 100 (A) 12/16/2021 1155  ? UROBILINOGEN 0.2 03/08/2015 2027  ? NITRITE NEGATIVE 12/16/2021 1155  ? LEUKOCYTESUR SMALL (A) 12/16/2021 1155   ? ?Sepsis Labs: ?'@LABRCNTIP'$ (procalcitonin:4,lacticidven:4) ?) ?Recent Results (from the past 240 hour(s))  ?Culture, blood (single)     Status: None (Preliminary result)  ? Collection Time: 12/16/21  2:57 PM  ? Specimen: Left Antecubital; Blood  ?Result Value Ref Range Status  ? Specimen Description LEFT ANTECUBITAL  Final  ? Special Requests   Final  ?  BOTTLES DRAWN AEROBIC AND ANAEROBIC Blood Culture adequate volume ?Performed at [facility], [address] [place], [place] [address] ?  ? Culture PENDING  Incomplete  ? Report Status PENDING  Incomplete  ?Resp Panel by RT-PCR (Flu A&B, Covid) Nasopharyngeal Swab     Status: None  ? Collection Time: 12/16/21  2:57 PM  ? Specimen: Nasopharyngeal Swab; Nasopharyngeal(NP) swabs in vial transport medium  ?Result Value Ref Range Status  ? SARS Coronavirus 2 by RT PCR NEGATIVE NEGATIVE Final  ?  Comment: (NOTE) ?SARS-CoV-2 target nucleic acids are NOT DETECTED. ? ?The SARS-CoV-2 RNA is generally detectable in upper respiratory ?specimens during the acute phase of infection. The lowest ?concentration of SARS-CoV-2 viral copies this assay can detect is ?138 copies/mL. A negative result does not preclude SARS-Cov-2 ?infection and should not be used as the sole basis for treatment or ?other patient management decisions. A negative result may occur with  ?improper specimen collection/handling, submission of specimen other ?than nasopharyngeal swab, presence of viral mutation(s) within the ?areas targeted by this assay, and inadequate number of viral ?copies(<138 copies/mL). A negative result must be combined with ?clinical observations, patient history, and epidemiological ?information. The expected result is Negative. ? ?Fact Sheet for Patients:  ?EntrepreneurPulse.com.au ? ?Fact Sheet for Healthcare Providers:  ?IncredibleEmployment.be ? ?This test is no t yet approved or cleared by the [place] FDA and  ?has been authorized for  detection and/or diagnosis of SARS-CoV-2 by ?FDA under an Emergency Use Authorization (EUA). This EUA will remain  ?in effect (meaning this test can be used) for the duration of the ?COVID-19 declaration under Section 5

## 2021-12-17 NOTE — Assessment & Plan Note (Addendum)
Concerned about underlying abscess/infected hematoma ?Urology following, appreciate input ?Reviewed by interventional radiology and it was felt that fluid collection was too gelatinous for drain placement.  If her clinical condition worsens, could consider aspiration ?Urine culture with E. coli ?Based on culture data, she was transitioned to Augmentin ?Leukocytosis resolved and no further fevers ?Per urology, will need 3 weeks of antibiotic therapy ?Plans are to follow-up with urology for repeat imaging ?

## 2021-12-17 NOTE — Progress Notes (Addendum)
? ?  Admitted to [facility] with fever; flank pain ?Recurrent UTI; Rt renal cyst ?Follows with Dr Alyson Ingles ? ?CT  ?Interval development of left subcapsular renal hematoma. Small ?amount of fat stranding also noted in the left paracolic ?gutter/retroperitoneum. ?1. 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 Continued ?follow-up recommended with renal protocol CT or MRI in 6 months. ?2. Hepatomegaly and subtle nodularity of the liver contour which may ?represent cirrhosis, correlate clinically. ?3. Colonic diverticulosis. ?4. Stable mildly enlarged portacaval lymph nodes. ? ?Request made for possible renal abscess drain placement ? ?Discussed with Dr Vernard Gambles-- he has reviewed all imaging  ?This hematoma likely is too gelatinous for drain placement. ?Could offer aspiration if clinically critical. ? ?Plan per chart is to continue antibiotics for now and go from there. ? ?Thank you ?

## 2021-12-17 NOTE — Hospital Course (Signed)
61 year old female with a history of fibromyalgia, overactive bladder, and tobacco abuse presenting with 1 day history of left lower quadrant and left flank pain that started acutely on 12/15/2021.  The patient had been in her usual state of health up until the early afternoon 12/15/2021.  The patient denies any recent injury or trauma.  She had fevers up to 101.6 ?F at home with nausea and vomiting.  She has had urinary urgency and dysuria.  In addition, the patient has had some loose stools but denies any hematochezia or melena.  The patient had been following urology, Dr. Alyson Ingles.  She had recently been started on Myrbetriq.  There was no other new medicines.  She has not been on any antibiotics recently.  She denies any headache, chest pain, shortness of breath, cough, hemoptysis.  She continues to smoke 2 packs/day. ?In the ED, the patient was febrile to 103.2 ?F.  She was tachycardic in the 110s.  She was hemodynamically stable.  Oxygen saturation was 88% on room air.  She was placed on 2 L with saturation up to 95%.  CT of the abdomen and pelvis showed a known right complex renal cyst.  There is a new left subcapsular renal hematoma with a small amount of fat stranding in the ?Left paracolic gutter.  Bilateral ureters were unremarkable.  There is diffuse wall thickening of the urinary bladder.  The patient was started on vancomycin, ceftriaxone, and metronidazole.  Urology, Dr. Alyson Ingles was consulted. ?

## 2021-12-17 NOTE — Assessment & Plan Note (Addendum)
Secondary to urinary source ?EDP spoke with Dr. Alyson Ingles who felt that left renal abnormality may represent abscess ?Urology following ?Lactic acid 1.2>>1.9 ?Initially started on vancomycin and cefepime ?Urine culture shows E. coli ?Blood cultures have not shown any growth ?Fevers resolved with antibiotic therapy ?Based on culture results, she has been transitioned to Augmentin for 3 more weeks of therapy and will follow up with urology for repeat imaging ?

## 2021-12-17 NOTE — Assessment & Plan Note (Addendum)
Baseline creatinine 0.9-1.1 ?Serum creatinine peaking 1.48 ?Continue IV fluids ?Creatinine improved back to baseline with IV fluids ?

## 2021-12-17 NOTE — Assessment & Plan Note (Signed)
Tobacco cessation discussed 

## 2021-12-17 NOTE — Assessment & Plan Note (Deleted)
Baseline creatinine 0.9-1.1 ?Continue IV fluids ?

## 2021-12-18 MED ORDER — OXYCODONE HCL 5 MG PO TABS
5.0000 mg | ORAL_TABLET | ORAL | Status: DC | PRN
  Administered 2021-12-18: 10 mg via ORAL
  Administered 2021-12-18: 5 mg via ORAL
  Administered 2021-12-19 (×2): 10 mg via ORAL
  Administered 2021-12-19 (×2): 5 mg via ORAL
  Administered 2021-12-20: 10 mg via ORAL
  Filled 2021-12-18 (×3): qty 2
  Filled 2021-12-18 (×2): qty 1
  Filled 2021-12-18: qty 2
  Filled 2021-12-18: qty 1

## 2021-12-18 MED ORDER — BUTALBITAL-APAP-CAFFEINE 50-325-40 MG PO TABS
2.0000 | ORAL_TABLET | Freq: Four times a day (QID) | ORAL | Status: DC | PRN
  Administered 2021-12-18 – 2021-12-20 (×2): 2 via ORAL
  Filled 2021-12-18 (×2): qty 2

## 2021-12-18 NOTE — Progress Notes (Signed)
Paitient c/o headache 10/10 given PRN tylenol BP is stable  ? ? ? ? 12/18/21 0700  ?Vitals  ?BP (!) 148/78  ?Pain Assessment  ?Pain Scale 0-10  ?Pain Score 10  ?Pain Type Acute pain  ?Pain Location Head  ? ? ?

## 2021-12-18 NOTE — Progress Notes (Signed)
?Progress Note ? ? ?Patient: Daisy [name] IRJ:[id number] DOB: Dec 02, 1960 DOA: 12/16/2021     2 ?DOS: the patient was seen and examined on 12/18/2021 ?  ?Brief hospital course: ?61 year old female with a history of fibromyalgia, overactive bladder, and tobacco abuse presenting with 1 day history of left lower quadrant and left flank pain that started acutely on 12/15/2021.  The patient had been in her usual state of health up until the early afternoon 12/15/2021.  The patient denies any recent injury or trauma.  She had fevers up to 101.6 ?F at home with nausea and vomiting.  She has had urinary urgency and dysuria.  In addition, the patient has had some loose stools but denies any hematochezia or melena.  The patient had been following urology, Dr. Alyson Ingles.  She had recently been started on Myrbetriq.  There was no other new medicines.  She has not been on any antibiotics recently.  She denies any headache, chest pain, shortness of breath, cough, hemoptysis.  She continues to smoke 2 packs/day. ?In the ED, the patient was febrile to 103.2 ?F.  She was tachycardic in the 110s.  She was hemodynamically stable.  Oxygen saturation was 88% on room air.  She was placed on 2 L with saturation up to 95%.  CT of the abdomen and pelvis showed a known right complex renal cyst.  There is a new left subcapsular renal hematoma with a small amount of fat stranding in the ?Left paracolic gutter.  Bilateral ureters were unremarkable.  There is diffuse wall thickening of the urinary bladder.  The patient was started on vancomycin, ceftriaxone, and metronidazole.  Urology, Dr. Alyson Ingles was consulted. ? ?Assessment and Plan: ?* Sepsis due to undetermined organism [facility]) ?Secondary to urinary source ?EDP spoke with Dr. Alyson Ingles who felt that left renal abnormality may represent abscess ?Urology following ?Lactic acid 1.2>>1.9 ?Continue vancomycin ?Currently on cefepime ?Urine culture shows gram-negative rods ?Blood cultures currently in  process ?Overall high fevers appear to be resolving ?continue IV fluids ? ?Renal hematoma, left ?Concerned about underlying abscess/infected hematoma ?Urology following, appreciate input ?Reviewed by interventional radiology and it was felt that fluid collection was too gelatinous for drain placement.  If her clinical condition worsens, could consider aspiration ?Urine culture with gram-negative rods ?Continue on cefepime for now pending further culture data ? ?Hypoxia ?Patient noted to have oxygen saturation 88% on room air ?She is not particularly short of breath ?Stable on 2 L presently ?Suspect the patient has underlying COPD ?She has over 80-pack-year history and continues to smoke 2 packs/day ? ?Acute renal failure superimposed on stage 3a chronic kidney disease ([place]) ?Baseline creatinine 0.9-1.1 ?Serum creatinine peaking 1.48 ?Continue IV fluids ? ?Tobacco abuse ?Tobacco cessation discussed ? ? ? ? ?  ? ?Subjective: Continues to have left-sided flank pain.  No nausea or vomiting. ? ?Physical Exam: ?Vitals:  ? 12/18/21 0015 12/18/21 0445 12/18/21 0700 12/18/21 1454  ?BP: 134/77 137/70 (!) 148/78 123/69  ?Pulse: 87 100  86  ?Resp: '18 19  18  '$ ?Temp: 98.8 ?F (37.1 ?C) 99.4 ?F (37.4 ?C)  98.6 ?F (37 ?C)  ?TempSrc: Oral Oral    ?SpO2: 94% 95%  99%  ?Weight:      ?Height:      ? ?General exam: Alert, awake, oriented x 3 ?Respiratory system: Clear to auscultation. Respiratory effort normal. ?Cardiovascular system:RRR. No murmurs, rubs, gallops. ?Gastrointestinal system: Abdomen is nondistended, soft and tender in left flank/left lower quadrant of abdomen. No organomegaly or  masses felt. Normal bowel sounds heard. ?Central nervous system: Alert and oriented. No focal neurological deficits. ?Extremities: No C/C/E, +pedal pulses ?Skin: No rashes, lesions or ulcers ?Psychiatry: Judgement and insight appear normal. Mood & affect appropriate.  ? ?Data Reviewed: ? ?There are no new results to review at this time. ? ?Family  Communication: Updated patient's husband at the bedside ? ?Disposition: ?Status is: Inpatient ?Remains inpatient appropriate because: Continued IV antibiotics and monitoring of progression of infection ? Planned Discharge Destination: Home ? ? ? ?Time spent: 35 minutes ? ?Author: ?Kathie Dike, MD ?12/18/2021 6:37 PM ? ?For on call review [email or web address].  ?

## 2021-12-19 LAB — CBC
HCT: 32.3 % — ABNORMAL LOW (ref 36.0–46.0)
Hemoglobin: 10.2 g/dL — ABNORMAL LOW (ref 12.0–15.0)
MCH: 27.3 pg (ref 26.0–34.0)
MCHC: 31.6 g/dL (ref 30.0–36.0)
MCV: 86.4 fL (ref 80.0–100.0)
Platelets: 213 10*3/uL (ref 150–400)
RBC: 3.74 MIL/uL — ABNORMAL LOW (ref 3.87–5.11)
RDW: 14.5 % (ref 11.5–15.5)
WBC: 10.4 10*3/uL (ref 4.0–10.5)
nRBC: 0 % (ref 0.0–0.2)

## 2021-12-19 LAB — BASIC METABOLIC PANEL
Anion gap: 6 (ref 5–15)
BUN: 18 mg/dL (ref 8–23)
CO2: 24 mmol/L (ref 22–32)
Calcium: 7.9 mg/dL — ABNORMAL LOW (ref 8.9–10.3)
Chloride: 106 mmol/L (ref 98–111)
Creatinine, Ser: 1.12 mg/dL — ABNORMAL HIGH (ref 0.44–1.00)
GFR, Estimated: 56 mL/min — ABNORMAL LOW (ref >60)
Glucose, Bld: 107 mg/dL — ABNORMAL HIGH (ref 70–99)
Potassium: 4.4 mmol/L (ref 3.5–5.1)
Sodium: 136 mmol/L (ref 135–145)

## 2021-12-19 LAB — URINE CULTURE: Culture: >=100000 — AB

## 2021-12-19 MED ORDER — SODIUM CHLORIDE 0.9 % IV SOLN
3.0000 g | Freq: Four times a day (QID) | INTRAVENOUS | Status: DC
  Administered 2021-12-19 – 2021-12-20 (×3): 3 g via INTRAVENOUS
  Filled 2021-12-19 (×7): qty 8

## 2021-12-19 NOTE — Progress Notes (Signed)
?Subjective: ?Patient reports improvement in her left flank pain. No worsening LUTS. No fevers over 101 in the past 24 hours. Urine culture grew pansensitive E coli ? ?Objective: ?Vital signs in last 24 hours: ?Temp:  [98.3 ?F (36.8 ?C)-99.7 ?F (37.6 ?C)] 98.3 ?F (36.8 ?C) (03/16 1320) ?Pulse Rate:  [86-91] 91 (03/16 1320) ?Resp:  [18] 18 (03/16 1320) ?BP: (123-150)/(69-79) 141/77 (03/16 1320) ?SpO2:  [96 %-99 %] 98 % (03/16 1320) ? ?Intake/Output from previous day: ?No intake/output data recorded. ?Intake/Output this shift: ?No intake/output data recorded. ? ?Physical Exam:  ?General:alert, cooperative, and appears stated age ?GI: soft, non tender, normal bowel sounds, no palpable masses, no organomegaly, no inguinal hernia ?Female genitalia: not done ?Extremities: extremities normal, atraumatic, no cyanosis or edema ? ?Lab Results: ?Recent Labs  ?  12/17/21 ?4854 12/19/21 ?0530  ?HGB 11.4* 10.2*  ?HCT 37.5 32.3*  ? ?BMET ?Recent Labs  ?  12/17/21 ?0454 12/19/21 ?0530  ?NA 137 136  ?K 4.5 4.4  ?CL 103 106  ?CO2 27 24  ?GLUCOSE 112* 107*  ?BUN 22 18  ?CREATININE 1.48* 1.12*  ?CALCIUM 7.8* 7.9*  ? ?No results for input(s): LABPT, INR in the last 72 hours. ?No results for input(s): LABURIN in the last 72 hours. ?Results for orders placed or performed during the hospital encounter of 12/16/21  ?Urine Culture     Status: Abnormal  ? Collection Time: 12/16/21 11:51 AM  ? Specimen: Urine, Clean Catch  ?Result Value Ref Range Status  ? Specimen Description   Final  ?  URINE, CLEAN CATCH ?Performed at [facility], [address], [place], [place] [address] ?  ? Special Requests   Final  ?  NONE ?Performed at [facility], [address], [place], [place] [address] ?  ? Culture >=100,000 COLONIES/mL ESCHERICHIA COLI (A)  Final  ? Report Status 12/19/2021 FINAL  Final  ? Organism ID, Bacteria ESCHERICHIA COLI (A)  Final  ?    Susceptibility  ? Escherichia coli - MIC*  ?  AMPICILLIN <=2 SENSITIVE Sensitive   ?  CEFAZOLIN <=4  SENSITIVE Sensitive   ?  CEFEPIME <=0.12 SENSITIVE Sensitive   ?  CEFTRIAXONE <=0.25 SENSITIVE Sensitive   ?  CIPROFLOXACIN <=0.25 SENSITIVE Sensitive   ?  GENTAMICIN <=1 SENSITIVE Sensitive   ?  IMIPENEM <=0.25 SENSITIVE Sensitive   ?  NITROFURANTOIN <=16 SENSITIVE Sensitive   ?  TRIMETH/SULFA <=20 SENSITIVE Sensitive   ?  AMPICILLIN/SULBACTAM <=2 SENSITIVE Sensitive   ?  PIP/TAZO <=4 SENSITIVE Sensitive   ?  * >=100,000 COLONIES/mL ESCHERICHIA COLI  ?Culture, blood (single)     Status: None (Preliminary result)  ? Collection Time: 12/16/21  2:57 PM  ? Specimen: Left Antecubital; Blood  ?Result Value Ref Range Status  ? Specimen Description LEFT ANTECUBITAL  Final  ? Special Requests   Final  ?  BOTTLES DRAWN AEROBIC AND ANAEROBIC Blood Culture adequate volume  ? Culture   Final  ?  NO GROWTH 3 DAYS ?Performed at [facility], [address], [place], [place] [address] ?  ? Report Status PENDING  Incomplete  ?Resp Panel by RT-PCR (Flu A&B, Covid) Nasopharyngeal Swab     Status: None  ? Collection Time: 12/16/21  2:57 PM  ? Specimen: Nasopharyngeal Swab; Nasopharyngeal(NP) swabs in vial transport medium  ?Result Value Ref Range Status  ? SARS Coronavirus 2 by RT PCR NEGATIVE NEGATIVE Final  ?  Comment: (NOTE) ?SARS-CoV-2 target nucleic acids are NOT DETECTED. ? ?The SARS-CoV-2  RNA is generally detectable in upper respiratory ?specimens during the acute phase of infection. The lowest ?concentration of SARS-CoV-2 viral copies this assay can detect is ?138 copies/mL. A negative result does not preclude SARS-Cov-2 ?infection and should not be used as the sole basis for treatment or ?other patient management decisions. A negative result may occur with  ?improper specimen collection/handling, submission of specimen other ?than nasopharyngeal swab, presence of viral mutation(s) within the ?areas targeted by this assay, and inadequate number of viral ?copies(<138 copies/mL). A negative result must be combined with ?clinical  observations, patient history, and epidemiological ?information. The expected result is Negative. ? ?Fact Sheet for Patients:  ?EntrepreneurPulse.com.au ? ?Fact Sheet for Healthcare Providers:  ?IncredibleEmployment.be ? ?This test is no t yet approved or cleared by the [place] FDA and  ?has been authorized for detection and/or diagnosis of SARS-CoV-2 by ?FDA under an Emergency Use Authorization (EUA). This EUA will remain  ?in effect (meaning this test can be used) for the duration of the ?COVID-19 declaration under Section 564(b)(1) of the Act, 21 ?U.S.C.section 360bbb-3(b)(1), unless the authorization is terminated  ?or revoked sooner.  ? ? ?  ? Influenza A by PCR NEGATIVE NEGATIVE Final  ? Influenza B by PCR NEGATIVE NEGATIVE Final  ?  Comment: (NOTE) ?The Xpert Xpress SARS-CoV-2/FLU/RSV plus assay is intended as an aid ?in the diagnosis of influenza from Nasopharyngeal swab specimens and ?should not be used as a sole basis for treatment. Nasal washings and ?aspirates are unacceptable for Xpert Xpress SARS-CoV-2/FLU/RSV ?testing. ? ?Fact Sheet for Patients: ?EntrepreneurPulse.com.au ? ?Fact Sheet for Healthcare Providers: ?IncredibleEmployment.be ? ?This test is not yet approved or cleared by the [place] FDA and ?has been authorized for detection and/or diagnosis of SARS-CoV-2 by ?FDA under an Emergency Use Authorization (EUA). This EUA will remain ?in effect (meaning this test can be used) for the duration of the ?COVID-19 declaration under Section 564(b)(1) of the Act, 21 U.S.C. ?section 360bbb-3(b)(1), unless the authorization is terminated or ?revoked. ? ?Performed at [facility], [address], [place], [place] [address] ?  ? ? ?Studies/Results: ?No results found. ? ?Assessment/Plan: ?61yo with left renal abscess/left subcapsular hematoma ?-Patient continues to improve on antibiotic therapy. She can be discharged on culture  specific antibiotics for 3 weeks. She has an appointment in my office 3/27 with repeat renal imaging and cystoscopy. ? ? LOS: 3 days  ? ?Nicolette Bang ?12/19/2021, 2:10 PM ? ? ? ?

## 2021-12-19 NOTE — Progress Notes (Signed)
?Progress Note ? ? ?Patient: Daisy [name] DOB: November 08, 1960 DOA: 12/16/2021     3 ?DOS: the patient was seen and examined on 12/19/2021 ?  ?Brief hospital course: ?61 year old female with a history of fibromyalgia, overactive bladder, and tobacco abuse presenting with 1 day history of left lower quadrant and left flank pain that started acutely on 12/15/2021.  The patient had been in her usual state of health up until the early afternoon 12/15/2021.  The patient denies any recent injury or trauma.  She had fevers up to 101.6 ?F at home with nausea and vomiting.  She has had urinary urgency and dysuria.  In addition, the patient has had some loose stools but denies any hematochezia or melena.  The patient had been following urology, Dr. Alyson Ingles.  She had recently been started on Myrbetriq.  There was no other new medicines.  She has not been on any antibiotics recently.  She denies any headache, chest pain, shortness of breath, cough, hemoptysis.  She continues to smoke 2 packs/day. ?In the ED, the patient was febrile to 103.2 ?F.  She was tachycardic in the 110s.  She was hemodynamically stable.  Oxygen saturation was 88% on room air.  She was placed on 2 L with saturation up to 95%.  CT of the abdomen and pelvis showed a known right complex renal cyst.  There is a new left subcapsular renal hematoma with a small amount of fat stranding in the ?Left paracolic gutter.  Bilateral ureters were unremarkable.  There is diffuse wall thickening of the urinary bladder.  The patient was started on vancomycin, ceftriaxone, and metronidazole.  Urology, Dr. Alyson Ingles was consulted. ? ?Assessment and Plan: ?* Sepsis due to undetermined organism [facility]) ?Secondary to urinary source ?EDP spoke with Dr. Alyson Ingles who felt that left renal abnormality may represent abscess ?Urology following ?Lactic acid 1.2>>1.9 ?Continue vancomycin ?Currently on cefepime ?Urine culture shows E. coli ?Blood cultures have not shown any  growth ?Overall high fevers appear to be resolving ?continue IV fluids ? ?Renal hematoma, left ?Concerned about underlying abscess/infected hematoma ?Urology following, appreciate input ?Reviewed by interventional radiology and it was felt that fluid collection was too gelatinous for drain placement.  If her clinical condition worsens, could consider aspiration ?Urine culture with E. coli ?Based on culture data, she will be transition to Unasyn for now with likely further transition to Augmentin on discharge ? ?Hypoxia ?Patient noted to have oxygen saturation 88% on room air ?She is not particularly short of breath ?Stable on 2 L presently ?Suspect the patient has underlying COPD ?She has over 80-pack-year history and continues to smoke 2 packs/day ?Currently appears to be weaned down to room air ? ?Acute renal failure superimposed on stage 3a chronic kidney disease ([place]) ?Baseline creatinine 0.9-1.1 ?Serum creatinine peaking 1.48 ?Continue IV fluids ?Creatinine down to 1.1 today ? ?Tobacco abuse ?Tobacco cessation discussed ? ? ? ? ?  ? ?Subjective: Continues to have left flank pain ? ?Physical Exam: ?Vitals:  ? 12/18/21 0700 12/18/21 1454 12/19/21 0548 12/19/21 1320  ?BP: (!) 148/78 123/69 (!) 150/79 (!) 141/77  ?Pulse:  86 87 91  ?Resp:  18  18  ?Temp:  98.6 ?F (37 ?C) 99.7 ?F (37.6 ?C) 98.3 ?F (36.8 ?C)  ?TempSrc:   Oral Oral  ?SpO2:  99% 96% 98%  ?Weight:      ?Height:      ? ?General exam: Alert, awake, oriented x 3 ?Respiratory system: Clear to auscultation. Respiratory effort normal. ?  Cardiovascular system:RRR. No murmurs, rubs, gallops. ?Gastrointestinal system: Abdomen is nondistended, soft and nontender. No organomegaly or masses felt. Normal bowel sounds heard.  Tender left flank ?Central nervous system: Alert and oriented. No focal neurological deficits. ?Extremities: No C/C/E, +pedal pulses ?Skin: No rashes, lesions or ulcers ?Psychiatry: Judgement and insight appear normal. Mood & affect appropriate.   ? ?Data Reviewed: ? ?Reviewed CBC and chemistry ? ?Family Communication: Husband is not present at the bedside, unable to reach him over the phone ? ?Disposition: ?Status is: Inpatient ?Remains inpatient appropriate because: Continued IV antibiotics ? Planned Discharge Destination: Home ? ? ? ?Time spent: 35 minutes ? ?Author: ?Kathie Dike, MD ?12/19/2021 11:47 PM ? ?For on call review [email or web address].  ?

## 2021-12-19 NOTE — Plan of Care (Signed)
?  Problem: Urinary Elimination: ?Goal: Signs and symptoms of infection will decrease ?Outcome: Progressing ?  ?Problem: Education: ?Goal: Knowledge of General Education information will improve ?Description: Including pain rating scale, medication(s)/side effects and non-pharmacologic comfort measures ?Outcome: Progressing ?  ?

## 2021-12-19 NOTE — Progress Notes (Signed)
Pharmacy Antibiotic Note ? ?Daisy [name] is a 61 y.o. female admitted on 12/16/2021 with sepsis.  Pharmacy has been consulted for unasyn dosing. ? ?Plan: ?Unasyn 3gm IV q6h ?F/U cxs and clinical progress ?Monitor V/S,labs ? ?Height: '5\' 9"'$  (175.3 cm) ?Weight: 85.7 kg (189 lb) ?IBW/kg (Calculated) : 66.2 ? ?Temp (24hrs), Avg:99.2 ?F (37.3 ?C), Min:98.6 ?F (37 ?C), Max:99.7 ?F (37.6 ?C) ? ?Recent Labs  ?Lab 12/16/21 ?1155 12/16/21 ?1456 12/16/21 ?1649 12/17/21 ?0454 12/19/21 ?0530  ?WBC 16.8*  --   --  17.7* 10.4  ?CREATININE 1.36*  --   --  1.48* 1.12*  ?LATICACIDVEN  --  1.2 1.9  --   --   ? ?  ?Estimated Creatinine Clearance: 61.6 mL/min (A) (by C-G formula based on SCr of 1.12 mg/dL (H)).   ? ?No Known Allergies ? ?Antimicrobials this admission: ?unasyn 3/16>> ?cefepime 3/14 >> 3/16 ?Vancomycin 3/13>>3/15 ?Ceftriaxone and flagyl 3/13 x 1 dose ? ?Microbiology results: ?3/13 BCx: ngtd ?3/13 UCx: E. Coli > 100K CFU/ml pan sensitive ? ?Thank you for allowing pharmacy to be a part of this patient?s care. ? ?Isac Sarna, BS Pharm D, BCPS ?Clinical Pharmacist ?Pager [phone] ?12/19/2021 12:47 PM ? ?

## 2021-12-20 ENCOUNTER — Telehealth: Payer: Self-pay

## 2021-12-20 LAB — CBC
HCT: 30.9 % — ABNORMAL LOW (ref 36.0–46.0)
Hemoglobin: 9.8 g/dL — ABNORMAL LOW (ref 12.0–15.0)
MCH: 27.1 pg (ref 26.0–34.0)
MCHC: 31.7 g/dL (ref 30.0–36.0)
MCV: 85.4 fL (ref 80.0–100.0)
Platelets: 224 10*3/uL (ref 150–400)
RBC: 3.62 MIL/uL — ABNORMAL LOW (ref 3.87–5.11)
RDW: 14.6 % (ref 11.5–15.5)
WBC: 8.9 10*3/uL (ref 4.0–10.5)
nRBC: 0 % (ref 0.0–0.2)

## 2021-12-20 LAB — COMPREHENSIVE METABOLIC PANEL
ALT: 12 U/L (ref 0–44)
AST: 12 U/L — ABNORMAL LOW (ref 15–41)
Albumin: 2.6 g/dL — ABNORMAL LOW (ref 3.5–5.0)
Alkaline Phosphatase: 45 U/L (ref 38–126)
Anion gap: 9 (ref 5–15)
BUN: 13 mg/dL (ref 8–23)
CO2: 24 mmol/L (ref 22–32)
Calcium: 8 mg/dL — ABNORMAL LOW (ref 8.9–10.3)
Chloride: 106 mmol/L (ref 98–111)
Creatinine, Ser: 1 mg/dL (ref 0.44–1.00)
GFR, Estimated: >60 mL/min (ref >60)
Glucose, Bld: 100 mg/dL — ABNORMAL HIGH (ref 70–99)
Potassium: 3.5 mmol/L (ref 3.5–5.1)
Sodium: 139 mmol/L (ref 135–145)
Total Bilirubin: 0.5 mg/dL (ref 0.3–1.2)
Total Protein: 5.7 g/dL — ABNORMAL LOW (ref 6.5–8.1)

## 2021-12-20 MED ORDER — BUTALBITAL-APAP-CAFFEINE 50-325-40 MG PO TABS: 1.0000 | ORAL_TABLET | Freq: Four times a day (QID) | ORAL | 0 refills | Status: DC | PRN

## 2021-12-20 MED ORDER — OXYCODONE HCL 5 MG PO TABS: 5.0000 mg | ORAL_TABLET | Freq: Four times a day (QID) | ORAL | 0 refills | Status: DC | PRN

## 2021-12-20 MED ORDER — AMOXICILLIN-POT CLAVULANATE 875-125 MG PO TABS: 1.0000 | ORAL_TABLET | Freq: Two times a day (BID) | ORAL | 0 refills | Status: AC

## 2021-12-20 NOTE — Telephone Encounter (Signed)
Transition Care Management Follow-up Telephone Call ?Date of discharge and from where: [facility] 12/20/21 ?How have you been since you were released from the hospital? Pt states she is doing better.  ?Any questions or concerns? No ? ?Items Reviewed: ?Did the pt receive and understand the discharge instructions provided? Yes  ?Medications obtained and verified? Yes  ?Other? No  ?Any new allergies since your discharge? No  ?Dietary orders reviewed? Yes ?Do you have support at home? Yes  ? ?Home Care and Equipment/Supplies: ?Were home health services ordered? no ?If so, what is the name of the agency? N/A  ?Has the agency set up a time to come to the patient's home? not applicable ?Were any new equipment or medical supplies ordered?  No ?What is the name of the medical supply agency? N/A ?Were you able to get the supplies/equipment? not applicable ?Do you have any questions related to the use of the equipment or supplies? No ? ?Functional Questionnaire: (I = Independent and D = Dependent) ?ADLs: I ? ?Bathing/Dressing- I ? ?Meal Prep- I ? ?Eating- I ? ?Maintaining continence- I ? ?Transferring/Ambulation- I ? ?Managing Meds- I ? ?Follow up appointments reviewed: ? ?PCP Hospital f/u appt confirmed? Yes  Scheduled to see Mariane Baumgarten, NP on 12/25/21 @ 10:20. ?[facility] f/u appt confirmed? Yes  Scheduled to see Drm McKenzie on 12/30/21 @ 10:30 am. ?Are transportation arrangements needed? No  ?If their condition worsens, is the pt aware to call PCP or go to the Emergency Dept.? Yes ?Was the patient provided with contact information for the PCP's office or ED? Yes ?Was to pt encouraged to call back with questions or concerns? Yes ? ?

## 2021-12-20 NOTE — Progress Notes (Signed)
Discharge instructions reviewed with patient, verbalized understanding. IV removed. Patient to be transported via wheelchair to private vehicle.  ?

## 2021-12-20 NOTE — Discharge Summary (Signed)
?Physician Discharge Summary ?  ?Patient: Daisy [name] MRN: [id number] DOB: [birth year]  ?Admit date:     12/16/2021  ?Discharge date: 12/20/21  ?Discharge Physician: Kathie Dike  ? ?PCP: Kathyrn Drown, MD  ? ?Recommendations at discharge:  ? ?Follow-up with urology on 3/27 as previously scheduled for cystoscopy and repeat renal imaging. ? ?Discharge Diagnoses: ?Principal Problem: ?  Sepsis due to undetermined organism [facility]) ?Active Problems: ?  Renal hematoma, left ?  Hypoxia ?  Acute renal failure superimposed on stage 3a chronic kidney disease ([place]) ?  Tobacco abuse ? ?Resolved Problems: ?  * No resolved hospital problems. * ? ?Hospital Course: ?61 year old female with a history of fibromyalgia, overactive bladder, and tobacco abuse presenting with 1 day history of left lower quadrant and left flank pain that started acutely on 12/15/2021.  The patient had been in her usual state of health up until the early afternoon 12/15/2021.  The patient denies any recent injury or trauma.  She had fevers up to 101.6 ?F at home with nausea and vomiting.  She has had urinary urgency and dysuria.  In addition, the patient has had some loose stools but denies any hematochezia or melena.  The patient had been following urology, Dr. Alyson Ingles.  She had recently been started on Myrbetriq.  There was no other new medicines.  She has not been on any antibiotics recently.  She denies any headache, chest pain, shortness of breath, cough, hemoptysis.  She continues to smoke 2 packs/day. ?In the ED, the patient was febrile to 103.2 ?F.  She was tachycardic in the 110s.  She was hemodynamically stable.  Oxygen saturation was 88% on room air.  She was placed on 2 L with saturation up to 95%.  CT of the abdomen and pelvis showed a known right complex renal cyst.  There is a new left subcapsular renal hematoma with a small amount of fat stranding in the ?Left paracolic gutter.  Bilateral ureters were unremarkable.  There is diffuse wall  thickening of the urinary bladder.  The patient was started on vancomycin, ceftriaxone, and metronidazole.  Urology, Dr. Alyson Ingles was consulted. ? ?Assessment and Plan: ?* Sepsis due to undetermined organism [facility]) ?Secondary to urinary source ?EDP spoke with Dr. Alyson Ingles who felt that left renal abnormality may represent abscess ?Urology following ?Lactic acid 1.2>>1.9 ?Initially started on vancomycin and cefepime ?Urine culture shows E. coli ?Blood cultures have not shown any growth ?Fevers resolved with antibiotic therapy ?Based on culture results, she has been transitioned to Augmentin for 3 more weeks of therapy and will follow up with urology for repeat imaging ? ?Renal hematoma, left ?Concerned about underlying abscess/infected hematoma ?Urology following, appreciate input ?Reviewed by interventional radiology and it was felt that fluid collection was too gelatinous for drain placement.  If her clinical condition worsens, could consider aspiration ?Urine culture with E. coli ?Based on culture data, she was transitioned to Augmentin ?Leukocytosis resolved and no further fevers ?Per urology, will need 3 weeks of antibiotic therapy ?Plans are to follow-up with urology for repeat imaging ? ?Hypoxia ?Patient noted to have oxygen saturation 88% on room air ?She is not particularly short of breath ?Stable on 2 L presently ?Suspect the patient has underlying COPD ?She has over 80-pack-year history and continues to smoke 2 packs/day ?Currently appears to be weaned down to room air ? ?Acute renal failure superimposed on stage 3a chronic kidney disease ([place]) ?Baseline creatinine 0.9-1.1 ?Serum creatinine peaking 1.48 ?Continue IV fluids ?Creatinine improved  back to baseline with IV fluids ? ?Tobacco abuse ?Tobacco cessation discussed ? ? ? ? ?  ? ? ?Consultants: Urology ?Procedures performed:   ?Disposition: Home ?Diet recommendation:  ?Discharge Diet Orders (From admission, onward)  ? ?  Start     Ordered  ? 12/20/21  0000  Diet - low sodium heart healthy       ? 12/20/21 1123  ? ?  ?  ? ?  ? ?Regular diet ?DISCHARGE MEDICATION: ?Allergies as of 12/20/2021   ?No Known Allergies ?  ? ?  ?Medication List  ?  ? ?TAKE these medications   ? ?acetaminophen 650 MG CR tablet ?Commonly known as: TYLENOL ?Take 1,300 mg by mouth every 8 (eight) hours as needed for pain. ?  ?amoxicillin-clavulanate 875-125 MG tablet ?Commonly known as: Augmentin ?Take 1 tablet by mouth 2 (two) times daily for 21 days. ?  ?butalbital-acetaminophen-caffeine 50-325-40 MG tablet ?Commonly known as: FIORICET ?Take 1 tablet by mouth every 6 (six) hours as needed for headache. ?  ?mirabegron ER 25 MG Tb24 tablet ?Commonly known as: MYRBETRIQ ?Take 1 tablet (25 mg total) by mouth daily. ?  ?ondansetron 4 MG tablet ?Commonly known as: ZOFRAN ?Take 1 tablet (4 mg total) by mouth every 6 (six) hours as needed for nausea. ?  ?oxyCODONE 5 MG immediate release tablet ?Commonly known as: Oxy IR/ROXICODONE ?Take 1-2 tablets (5-10 mg total) by mouth every 6 (six) hours as needed for moderate pain. ?  ?polyethylene glycol 17 g packet ?Commonly known as: MIRALAX / GLYCOLAX ?Take 17 g by mouth daily as needed for mild constipation. ?  ? ?  ? ? Follow-up Information   ? ? McKenzie, Candee Furbish, MD Follow up on 12/30/2021.   ?Specialty: Urology ?Contact information: ?[address] ?[place] [address] ?[phone] ? ? ?  ?  ? ?  ?  ? ?  ? ?Discharge Exam: ?Filed Weights  ? 12/16/21 1151 12/16/21 2020  ?Weight: 84.8 kg 85.7 kg  ? ?General exam: Alert, awake, oriented x 3 ?Respiratory system: Clear to auscultation. Respiratory effort normal. ?Cardiovascular system:RRR. No murmurs, rubs, gallops. ?Gastrointestinal system: Abdomen is nondistended, soft and nontender. No organomegaly or masses felt. Normal bowel sounds heard. ?Central nervous system: Alert and oriented. No focal neurological deficits. ?Extremities: No C/C/E, +pedal pulses ?Skin: No rashes, lesions or  ulcers ?Psychiatry: Judgement and insight appear normal. Mood & affect appropriate.  ? ? ?Condition at discharge: fair ? ?The results of significant diagnostics from this hospitalization (including imaging, microbiology, ancillary and laboratory) are listed below for reference.  ? ?Imaging Studies: ?CT ABDOMEN PELVIS W WO CONTRAST ? ?Result Date: 12/09/2021 ?CLINICAL DATA:  Recurrent UTI, renal mass EXAM: CT ABDOMEN AND PELVIS WITHOUT AND WITH CONTRAST TECHNIQUE: Multidetector CT imaging of the abdomen and pelvis was performed following the standard protocol before and following the bolus administration of intravenous contrast. RADIATION DOSE REDUCTION: This exam was performed according to the departmental dose-optimization program which includes automated exposure control, adjustment of the mA and/or kV according to patient size and/or use of iterative reconstruction technique. CONTRAST:  61m OMNIPAQUE IOHEXOL 300 MG/ML  SOLN COMPARISON:  MRI abdomen 05/17/2021 FINDINGS: Lower chest: 6 mm pulmonary nodule in the medial right lower lobe which is stable since at least 2018. Hepatobiliary: Liver is enlarged measuring 19.2 cm in length there is subtle nodularity of the liver contour. No suspicious hepatic mass identified. Gallbladder is surgically absent. No biliary ductal dilatation. Pancreas: Unremarkable. No pancreatic ductal  dilatation or surrounding inflammatory changes. Spleen: Normal in size without focal abnormality. Adrenals/Urinary Tract: Adrenal glands are normal. No nephrolithiasis or hydronephrosis. 11 mm slightly exophytic complex lesion in the upper pole right kidney which appears to demonstrate areas of internal enhancement including possible enhancing septations and/or nodularity, limited evaluation due to small size. Several additional simple appearing renal cysts identified bilaterally measuring up to 4 cm parapelvic on the left. Urinary bladder appears within normal limits. Stomach/Bowel: No bowel  obstruction, free air or pneumatosis. Severe colonic diverticulosis. No bowel wall edema identified. Appendix not visualized. Vascular/Lymphatic: No aneurysm. Stable mildly enlarged portacaval lymph nodes measuring u

## 2021-12-21 LAB — CULTURE, BLOOD (SINGLE)
Culture: NO GROWTH
Special Requests: ADEQUATE

## 2021-12-25 ENCOUNTER — Ambulatory Visit (INDEPENDENT_AMBULATORY_CARE_PROVIDER_SITE_OTHER): Payer: Medicaid Other | Admitting: Nurse Practitioner

## 2021-12-25 ENCOUNTER — Other Ambulatory Visit: Payer: Self-pay

## 2021-12-25 ENCOUNTER — Encounter: Payer: Self-pay | Admitting: Nurse Practitioner

## 2021-12-25 VITALS — BP 132/83 | HR 96 | Temp 98.0°F | Wt 184.6 lb

## 2021-12-25 DIAGNOSIS — R110 Nausea: Secondary | ICD-10-CM

## 2021-12-25 DIAGNOSIS — R197 Diarrhea, unspecified: Secondary | ICD-10-CM

## 2021-12-25 MED ORDER — ONDANSETRON 4 MG PO TBDP: 8.0000 mg | ORAL_TABLET | Freq: Three times a day (TID) | ORAL | 0 refills | Status: DC | PRN

## 2021-12-25 NOTE — Progress Notes (Signed)
? ?  Subjective:  ? ? Patient ID: Daisy [name], female    DOB: 1960/12/09, 60 y.o.   MRN: [id number] ? ?HPI ? ?61 year old female patient with history of recurrent UTIs, hematuria, obesity and tobacco use is here for hospital follow-up.  Patient recently admitted to [facility] for sepsis 12/16/21-12/20/21. Pt states she had a hematoma on left kidney that has become abscessed. Pt states she has not felt good since coming home. Pt has been having bouts of diarrhea, vomiting, stomach swelling and stomach "feels like a puffer fish".  Patient states that when she does vomit she vomits bilious vomit.  Last vomiting episode was 2 days ago.  Patient admits to having diarrhea 4-6 times a day.  Patient denies any blood in her vomit blood in her stool, fever, chills, body aches.  Patient states she is able to pass gas without difficulty. ? ?Patient has follow-up with urology on December 30, 2021. ? ? ?Review of Systems  ?Gastrointestinal:  Positive for abdominal pain, diarrhea, nausea and vomiting.  ?All other systems reviewed and are negative. ? ?   ?Objective:  ? Physical Exam ?Vitals reviewed.  ?Constitutional:   ?   General: She is not in acute distress. ?   Appearance: Normal appearance. She is obese. She is not ill-appearing, toxic-appearing or diaphoretic.  ?Cardiovascular:  ?   Rate and Rhythm: Normal rate and regular rhythm.  ?   Pulses: Normal pulses.  ?   Heart sounds: Normal heart sounds.  ?Pulmonary:  ?   Effort: Pulmonary effort is normal. No respiratory distress.  ?   Breath sounds: Normal breath sounds. No wheezing.  ?Abdominal:  ?   General: Abdomen is flat. Bowel sounds are normal. There is distension.  ?   Palpations: Abdomen is soft. There is no hepatomegaly, mass or pulsatile mass.  ?   Tenderness: There is abdominal tenderness in the left upper quadrant. There is left CVA tenderness. There is no right CVA tenderness, guarding or rebound. Negative signs include Murphy's sign, Rovsing's sign and McBurney's sign.   ?   Hernia: No hernia is present.  ?Skin: ?   General: Skin is warm.  ?Neurological:  ?   Mental Status: She is alert.  ?   Comments:  Grossly intact  ?Psychiatric:     ?   Mood and Affect: Mood normal.     ?   Behavior: Behavior normal.  ? ? ? ? ? ? ?   ?Assessment & Plan:  ? ?1. Diarrhea, unspecified type ?-Diarrhea likely caused by use of Augmentin however C. difficile also considered. ?-We will also evaluate CBC and electrolyte balances with CMP ?- C difficile Toxins A+B W/Rflx ?- CBC with Differential/Platelet ?- CMP14+EGFR ?-Follow-up with Dr. Nicki Reaper in 4 weeks ? ?2.  Nausea ?- ondansetron (ZOFRAN-ODT) 4 MG disintegrating tablet; Take 2 tablets (8 mg total) by mouth every 8 (eight) hours as needed for nausea or vomiting.  Dispense: 40 tablet; Refill: 0 ?-Follow-up with Dr. Nicki Reaper in 4 weeks ? ?  ?Note:  This document was prepared using Dragon voice recognition software and may include unintentional dictation errors. ? ? ?

## 2021-12-26 LAB — CMP14+EGFR
ALT: 24 IU/L (ref 0–32)
AST: 27 IU/L (ref 0–40)
Albumin/Globulin Ratio: 1.1 — ABNORMAL LOW (ref 1.2–2.2)
Albumin: 3.7 g/dL — ABNORMAL LOW (ref 3.8–4.8)
Alkaline Phosphatase: 77 IU/L (ref 44–121)
BUN/Creatinine Ratio: 10 — ABNORMAL LOW (ref 12–28)
BUN: 11 mg/dL (ref 8–27)
Bilirubin Total: 0.2 mg/dL (ref 0.0–1.2)
CO2: 23 mmol/L (ref 20–29)
Calcium: 8.6 mg/dL — ABNORMAL LOW (ref 8.7–10.3)
Chloride: 101 mmol/L (ref 96–106)
Creatinine, Ser: 1.09 mg/dL — ABNORMAL HIGH (ref 0.57–1.00)
Globulin, Total: 3.5 g/dL (ref 1.5–4.5)
Glucose: 104 mg/dL — ABNORMAL HIGH (ref 70–99)
Potassium: 3.9 mmol/L (ref 3.5–5.2)
Sodium: 140 mmol/L (ref 134–144)
Total Protein: 7.2 g/dL (ref 6.0–8.5)
eGFR: 58 mL/min/{1.73_m2} — ABNORMAL LOW (ref >59)

## 2021-12-26 LAB — CBC WITH DIFFERENTIAL/PLATELET
Basophils Absolute: 0 10*3/uL (ref 0.0–0.2)
Basos: 0 %
EOS (ABSOLUTE): 0.5 10*3/uL — ABNORMAL HIGH (ref 0.0–0.4)
Eos: 4 %
Hematocrit: 37.9 % (ref 34.0–46.6)
Hemoglobin: 12.3 g/dL (ref 11.1–15.9)
Lymphocytes Absolute: 2 10*3/uL (ref 0.7–3.1)
Lymphs: 15 %
MCH: 27 pg (ref 26.6–33.0)
MCHC: 32.5 g/dL (ref 31.5–35.7)
MCV: 83 fL (ref 79–97)
Monocytes Absolute: 0.7 10*3/uL (ref 0.1–0.9)
Monocytes: 5 %
Neutrophils Absolute: 10.1 10*3/uL — ABNORMAL HIGH (ref 1.4–7.0)
Neutrophils: 76 %
Platelets: 474 10*3/uL — ABNORMAL HIGH (ref 150–450)
RBC: 4.55 x10E6/uL (ref 3.77–5.28)
RDW: 14.3 % (ref 11.7–15.4)
WBC: 13.3 10*3/uL — ABNORMAL HIGH (ref 3.4–10.8)

## 2021-12-30 ENCOUNTER — Other Ambulatory Visit: Payer: Self-pay

## 2021-12-30 ENCOUNTER — Ambulatory Visit (INDEPENDENT_AMBULATORY_CARE_PROVIDER_SITE_OTHER): Payer: Self-pay | Admitting: Urology

## 2021-12-30 ENCOUNTER — Ambulatory Visit (HOSPITAL_COMMUNITY)
Admission: RE | Admit: 2021-12-30 | Discharge: 2021-12-30 | Disposition: A | Discharge status: Home / Self Care | Payer: Medicaid Other | Source: Ambulatory Visit | Attending: Urology | Admitting: Urology

## 2021-12-30 ENCOUNTER — Encounter: Payer: Self-pay | Admitting: Urology

## 2021-12-30 VITALS — BP 153/82 | HR 101

## 2021-12-30 DIAGNOSIS — N2889 Other specified disorders of kidney and ureter: Secondary | ICD-10-CM | POA: Insufficient documentation

## 2021-12-30 DIAGNOSIS — N390 Urinary tract infection, site not specified: Secondary | ICD-10-CM

## 2021-12-30 LAB — URINALYSIS, ROUTINE W REFLEX MICROSCOPIC
Bilirubin, UA: NEGATIVE
Glucose, UA: NEGATIVE
Ketones, UA: NEGATIVE
Leukocytes,UA: NEGATIVE
Nitrite, UA: NEGATIVE
Protein,UA: NEGATIVE
Specific Gravity, UA: 1.015 (ref 1.005–1.030)
Urobilinogen, Ur: 0.2 mg/dL (ref 0.2–1.0)
pH, UA: 6 (ref 5.0–7.5)

## 2021-12-30 LAB — C DIFFICILE, CYTOTOXIN B

## 2021-12-30 LAB — C DIFFICILE TOXINS A+B W/RFLX: C difficile Toxins A+B, EIA: NEGATIVE

## 2021-12-30 LAB — MICROSCOPIC EXAMINATION
Epithelial Cells (non renal): NONE SEEN /hpf (ref 0–10)
Renal Epithel, UA: NONE SEEN /hpf

## 2021-12-30 MED ORDER — OXYCODONE HCL 5 MG PO TABS: 5.0000 mg | ORAL_TABLET | Freq: Four times a day (QID) | ORAL | 0 refills | Status: DC | PRN

## 2021-12-30 MED ORDER — CIPROFLOXACIN HCL 500 MG PO TABS: 500.0000 mg | ORAL_TABLET | Freq: Once | ORAL | Status: DC

## 2021-12-30 MED ORDER — NITROFURANTOIN MACROCRYSTAL 50 MG PO CAPS: 50.0000 mg | ORAL_CAPSULE | Freq: Every day | ORAL | 11 refills | Status: DC

## 2021-12-30 NOTE — Addendum Note (Signed)
Addended by: Cleon Gustin on: 12/30/2021 11:23 AM ? ? Modules accepted: Orders ? ?

## 2021-12-30 NOTE — Addendum Note (Signed)
Addended byIris Pert on: 12/30/2021 11:59 AM ? ? Modules accepted: Orders ? ?

## 2021-12-30 NOTE — Progress Notes (Signed)
? ?  12/30/21 ? ?CC: recurrent UTI ? ? ?HPI: ?Daisy [name] is a 61yo here for followup for recurrent UTI. She is doing well since hospital discharge for a left renal subcapsular hematoma ?Blood pressure (!) 153/82, pulse (!) 101. ?NED. A&Ox3.   ?No respiratory distress   ?Abd soft, NT, ND ?Normal external genitalia with patent urethral meatus ? ?Cystoscopy Procedure Note ? ?Patient identification was confirmed, informed consent was obtained, and patient was prepped using Betadine solution.  Lidocaine jelly was administered per urethral meatus.   ? ?Procedure: ?- Flexible cystoscope introduced, without any difficulty.   ?- Thorough search of the bladder revealed: ?   normal urethral meatus ?   normal urothelium ?   no stones ?   no ulcers  ?   no tumors ?   no urethral polyps ?   no trabeculation ? ?- Ureteral orifices were normal in position and appearance. ? ?Post-Procedure: ?- Patient tolerated the procedure well ? ?Assessment/ Plan: ?Renal US today, will call with results. -We discussed the natural hx of recurrent UTIs and the various causes. We discussed the treatment options including post coital prophylaxis, daily prophylaxis, topical estrogen therapy. We will trial macrobid '50mg'$  QHS. RTC 3 months ? ? No follow-ups on file. ? ?Nicolette Bang, MD  ?

## 2021-12-30 NOTE — Addendum Note (Signed)
Addended by: Cleon Gustin on: 12/30/2021 11:02 AM ? ? Modules accepted: Orders ? ?

## 2021-12-31 ENCOUNTER — Telehealth: Payer: Self-pay

## 2021-12-31 NOTE — Telephone Encounter (Signed)
Patient called requesting results of her ultrasound. ?

## 2022-01-01 ENCOUNTER — Other Ambulatory Visit: Payer: Self-pay | Admitting: Urology

## 2022-01-01 ENCOUNTER — Encounter: Payer: Self-pay | Admitting: Urology

## 2022-01-01 DIAGNOSIS — N2889 Other specified disorders of kidney and ureter: Secondary | ICD-10-CM

## 2022-01-01 NOTE — Telephone Encounter (Signed)
Patient aware, states that her temperature is normal during the day and seems to spike at night.  Per Dr. Alyson Ingles if her fever spikes again she needs to go to the ER.  Patient aware and voices understanding.  ?

## 2022-01-01 NOTE — Telephone Encounter (Signed)
Patient called again requesting her results.  She wanted to inform you that she had a fever of 103.6 last night, right now her temperature is 100.5. ?

## 2022-01-02 ENCOUNTER — Ambulatory Visit (HOSPITAL_COMMUNITY)
Admission: RE | Admit: 2022-01-02 | Discharge: 2022-01-02 | Disposition: A | Discharge status: Home / Self Care | Payer: Self-pay | Source: Ambulatory Visit | Attending: Urology | Admitting: Urology

## 2022-01-02 DIAGNOSIS — N2889 Other specified disorders of kidney and ureter: Secondary | ICD-10-CM | POA: Insufficient documentation

## 2022-01-02 MED ORDER — IOHEXOL 300 MG/ML  SOLN
100.0000 mL | Freq: Once | INTRAMUSCULAR | Status: AC | PRN
Start: 2022-01-02 — End: 2022-01-02
  Administered 2022-01-02: 100 mL via INTRAVENOUS

## 2022-03-25 ENCOUNTER — Ambulatory Visit (INDEPENDENT_AMBULATORY_CARE_PROVIDER_SITE_OTHER): Payer: Self-pay | Admitting: Urology

## 2022-03-25 VITALS — BP 155/82 | HR 93

## 2022-03-25 DIAGNOSIS — N390 Urinary tract infection, site not specified: Secondary | ICD-10-CM

## 2022-03-25 MED ORDER — NITROFURANTOIN MACROCRYSTAL 50 MG PO CAPS: 50.0000 mg | ORAL_CAPSULE | Freq: Every day | ORAL | 11 refills | Status: DC

## 2022-03-25 NOTE — Progress Notes (Unsigned)
03/25/2022 9:34 AM   [name] 1960/10/22 [id number]  Referring provider: Kathyrn Drown, MD [address] [place],  [place] [address]  Followup OAB and recurrent UTI   HPI: She has worsening urinary frequency, urgency for the past week. UA is normal. She is currently on mirabegron '25mg'$  daily.   PMH: Past Medical History:  Diagnosis Date   Chronic pain syndrome    Depression    Fatigue    Fibromyalgia    Normal cardiac stress test 2002   Thyroid disease     Surgical History: Past Surgical History:  Procedure Laterality Date   APPENDECTOMY     CESAREAN SECTION     twice '84 and '89   [place]  2004 and 2005   twice  same site after cholecystectomy   NASAL SINUS SURGERY     twice -enlarged sinuses and cleared scar tissue   TONSILLECTOMY      Home Medications:  Allergies as of 03/25/2022   No Known Allergies      Medication List        Accurate as of March 25, 2022  9:34 AM. If you have any questions, ask your nurse or doctor.          acetaminophen 650 MG CR tablet Commonly known as: TYLENOL Take 1,300 mg by mouth every 8 (eight) hours as needed for pain.   butalbital-acetaminophen-caffeine 50-325-40 MG tablet Commonly known as: FIORICET Take 1 tablet by mouth every 6 (six) hours as needed for headache.   mirabegron ER 25 MG Tb24 tablet Commonly known as: MYRBETRIQ Take 1 tablet (25 mg total) by mouth daily.   nitrofurantoin 50 MG capsule Commonly known as: MACRODANTIN Take 1 capsule (50 mg total) by mouth at bedtime.   ondansetron 4 MG disintegrating tablet Commonly known as: ZOFRAN-ODT Take 2 tablets (8 mg total) by mouth every 8 (eight) hours as needed for nausea or vomiting.   oxyCODONE 5 MG immediate release tablet Commonly known as: Oxy IR/ROXICODONE Take 1-2 tablets (5-10 mg total) by mouth every 6 (six) hours as needed for moderate pain.    polyethylene glycol 17 g packet Commonly known as: MIRALAX / GLYCOLAX Take 17 g by mouth daily as needed for mild constipation.        Allergies: No Known Allergies  Family History: Family History  Problem Relation Age of Onset   Heart disease Father     Social History:  reports that she has been smoking cigarettes. She has been smoking an average of 1 pack per day. She has never used smokeless tobacco. She reports current drug use. Drug: Marijuana. She reports that she does not drink alcohol.  ROS: All other review of systems were reviewed and are negative except what is noted above in HPI  Physical Exam: BP (!) 155/82   Pulse 93   Constitutional:  Alert and oriented, No acute distress. HEENT: [place] AT, moist mucus membranes.  Trachea midline, no masses. Cardiovascular: No clubbing, cyanosis, or edema. Respiratory: Normal respiratory effort, no increased work of breathing. GI: Abdomen is soft, nontender, nondistended, no abdominal masses GU: No CVA tenderness.  Lymph: No cervical or inguinal lymphadenopathy. Skin: No rashes, bruises or suspicious lesions. Neurologic: Grossly intact, no focal deficits, moving all 4 extremities. Psychiatric: Normal mood and affect.  Laboratory Data: Lab Results  Component Value Date   WBC 13.3 (H)  12/25/2021   HGB 12.3 12/25/2021   HCT 37.9 12/25/2021   MCV 83 12/25/2021   PLT 474 (H) 12/25/2021    Lab Results  Component Value Date   CREATININE 1.09 (H) 12/25/2021    No results found for: "PSA"  No results found for: "TESTOSTERONE"  No results found for: "HGBA1C"  Urinalysis    Component Value Date/Time   COLORURINE YELLOW 12/16/2021 1155   APPEARANCEUR Clear 12/30/2021 1109   LABSPEC 1.019 12/16/2021 1155   PHURINE 6.0 12/16/2021 1155   GLUCOSEU Negative 12/30/2021 1109   HGBUR MODERATE (A) 12/16/2021 1155   BILIRUBINUR Negative 12/30/2021 1109   KETONESUR NEGATIVE 12/16/2021 1155   PROTEINUR Negative 12/30/2021 1109    PROTEINUR 100 (A) 12/16/2021 1155   UROBILINOGEN 0.2 03/08/2015 2027   NITRITE Negative 12/30/2021 1109   NITRITE NEGATIVE 12/16/2021 1155   LEUKOCYTESUR Negative 12/30/2021 1109   LEUKOCYTESUR SMALL (A) 12/16/2021 1155    Lab Results  Component Value Date   LABMICR See below: 12/30/2021   WBCUA 0-5 12/30/2021   LABEPIT None seen 12/30/2021   BACTERIA Few 12/30/2021    Pertinent Imaging: *** No results found for this or any previous visit.  No results found for this or any previous visit.  No results found for this or any previous visit.  No results found for this or any previous visit.  Results for orders placed in visit on 12/30/21  Ultrasound renal complete  Narrative CLINICAL DATA:  LEFT renal hematoma, renal abscess, follow-up  EXAM: RENAL / URINARY TRACT ULTRASOUND COMPLETE  COMPARISON:  CT abdomen and pelvis 12/16/2021, renal ultrasound  FINDINGS: Right Kidney:  Renal measurements: 10.2 x 3.9 x 5.4 cm = volume: 112 mL. Tiny subcapsular cyst RIGHT kidney 10 mm greatest diameter. No additional renal mass, hydronephrosis, or shadowing calcification.  Left Kidney:  Renal measurements: 11.9 x 7.1 x 8.2 cm = volume: 363 mL. Intraparenchymal hypoechoic region within lateral mid inferior LEFT kidney, 4.1 cm greatest size, corresponding to the intraparenchymal portion of the hematoma seen on the prior exam. This is contiguous with a larger perinephric/subcapsular collection identified at the mid inferior LEFT kidney measuring 7.5 x 4.8 x 3.8 cm, corresponding to the larger com pollen of hematoma seen on the prior CT. This collection demonstrates foci of increased echogenicity concerning for foci of gas. Mildly dilated collecting system as seen on prior CT. No additional masses.  Bladder:  Appears normal for degree of bladder distention.  Other:  None.  IMPRESSION: Mildly dilated LEFT renal collecting system as seen on prior CT.  4.1 cm  intraparenchymal hematoma lateral mid inferior LEFT kidney contiguous with a 7.5 x 4.8 x 3.8 cm complex collection at the lateral aspect and the mid inferior pole LEFT kidney, by prior CT a subcapsular hematoma.  New foci of punctate increased echogenicity are seen within these collections, concerning for foci of gas, question abscess.  Repeat CT imaging with IV contrast recommended.  Findings discussed with Dr. Alyson Ingles on 12/30/2021 at 1246 hours.   Electronically Signed By: Lavonia Dana M.D. On: 12/30/2021 12:47  No results found for this or any previous visit.  No results found for this or any previous visit.  No results found for this or any previous visit.   Assessment & Plan:    1. Recurrent UTI -continue macrobid '50mg'$  daily  2. OAB -We will increase mirabegron to '50mg'$ . Patient to call in 4 weeks if she desires prescription   No follow-ups on file.  Saralyn Pilar  [place], [facility] [place]

## 2022-04-01 ENCOUNTER — Encounter: Payer: Self-pay | Admitting: Urology

## 2022-04-10 ENCOUNTER — Encounter: Payer: Self-pay | Admitting: Family Medicine

## 2022-04-22 ENCOUNTER — Other Ambulatory Visit: Payer: Self-pay

## 2022-04-22 MED ORDER — MIRABEGRON ER 50 MG PO TB24: 50.0000 mg | ORAL_TABLET | Freq: Every day | ORAL | 11 refills | Status: DC

## 2022-04-22 NOTE — Progress Notes (Signed)
Patient called office to state '50mg'$  myrbetriq samples have improved symptoms and wishes to have rx sent to pharmacy. Rx sent to pharmacy.

## 2022-09-13 ENCOUNTER — Emergency Department (HOSPITAL_COMMUNITY)
Admission: EM | Admit: 2022-09-13 | Discharge: 2022-09-13 | Disposition: A | Discharge status: Home / Self Care | Payer: Medicaid Other | Attending: Emergency Medicine | Admitting: Emergency Medicine

## 2022-09-13 ENCOUNTER — Emergency Department (HOSPITAL_COMMUNITY): Payer: Medicaid Other

## 2022-09-13 ENCOUNTER — Other Ambulatory Visit: Payer: Self-pay

## 2022-09-13 ENCOUNTER — Encounter (HOSPITAL_COMMUNITY): Payer: Self-pay | Admitting: *Deleted

## 2022-09-13 DIAGNOSIS — R062 Wheezing: Secondary | ICD-10-CM | POA: Insufficient documentation

## 2022-09-13 DIAGNOSIS — R059 Cough, unspecified: Secondary | ICD-10-CM | POA: Diagnosis not present

## 2022-09-13 DIAGNOSIS — M549 Dorsalgia, unspecified: Secondary | ICD-10-CM | POA: Diagnosis not present

## 2022-09-13 DIAGNOSIS — B974 Respiratory syncytial virus as the cause of diseases classified elsewhere: Secondary | ICD-10-CM | POA: Insufficient documentation

## 2022-09-13 DIAGNOSIS — Z20822 Contact with and (suspected) exposure to covid-19: Secondary | ICD-10-CM | POA: Diagnosis not present

## 2022-09-13 DIAGNOSIS — B338 Other specified viral diseases: Secondary | ICD-10-CM

## 2022-09-13 DIAGNOSIS — R0602 Shortness of breath: Secondary | ICD-10-CM | POA: Insufficient documentation

## 2022-09-13 DIAGNOSIS — R0981 Nasal congestion: Secondary | ICD-10-CM | POA: Diagnosis not present

## 2022-09-13 DIAGNOSIS — F17200 Nicotine dependence, unspecified, uncomplicated: Secondary | ICD-10-CM | POA: Diagnosis not present

## 2022-09-13 DIAGNOSIS — R110 Nausea: Secondary | ICD-10-CM | POA: Insufficient documentation

## 2022-09-13 DIAGNOSIS — R079 Chest pain, unspecified: Secondary | ICD-10-CM

## 2022-09-13 DIAGNOSIS — M25511 Pain in right shoulder: Secondary | ICD-10-CM | POA: Diagnosis not present

## 2022-09-13 DIAGNOSIS — R0789 Other chest pain: Secondary | ICD-10-CM | POA: Diagnosis not present

## 2022-09-13 LAB — RESP PANEL BY RT-PCR (RSV, FLU A&B, COVID)  RVPGX2
Influenza A by PCR: NEGATIVE
Influenza B by PCR: NEGATIVE
Resp Syncytial Virus by PCR: POSITIVE — AB
SARS Coronavirus 2 by RT PCR: NEGATIVE

## 2022-09-13 LAB — CBC
HCT: 41.3 % (ref 36.0–46.0)
Hemoglobin: 13.6 g/dL (ref 12.0–15.0)
MCH: 28 pg (ref 26.0–34.0)
MCHC: 32.9 g/dL (ref 30.0–36.0)
MCV: 85 fL (ref 80.0–100.0)
Platelets: 215 10*3/uL (ref 150–400)
RBC: 4.86 MIL/uL (ref 3.87–5.11)
RDW: 14.5 % (ref 11.5–15.5)
WBC: 10.4 10*3/uL (ref 4.0–10.5)
nRBC: 0 % (ref 0.0–0.2)

## 2022-09-13 LAB — BASIC METABOLIC PANEL
Anion gap: 9 (ref 5–15)
BUN: 20 mg/dL (ref 8–23)
CO2: 22 mmol/L (ref 22–32)
Calcium: 9 mg/dL (ref 8.9–10.3)
Chloride: 108 mmol/L (ref 98–111)
Creatinine, Ser: 1.16 mg/dL — ABNORMAL HIGH (ref 0.44–1.00)
GFR, Estimated: 54 mL/min — ABNORMAL LOW (ref >60)
Glucose, Bld: 104 mg/dL — ABNORMAL HIGH (ref 70–99)
Potassium: 3.2 mmol/L — ABNORMAL LOW (ref 3.5–5.1)
Sodium: 139 mmol/L (ref 135–145)

## 2022-09-13 LAB — TROPONIN I (HIGH SENSITIVITY)
Troponin I (High Sensitivity): 5 ng/L (ref <18)
Troponin I (High Sensitivity): 4 ng/L (ref <18)

## 2022-09-13 LAB — D-DIMER, QUANTITATIVE: D-Dimer, Quant: 0.4 ug/mL-FEU (ref 0.00–0.50)

## 2022-09-13 MED ORDER — HYDROCOD POLI-CHLORPHE POLI ER 10-8 MG/5ML PO SUER: 5.0000 mL | Freq: Every evening | ORAL | 0 refills | Status: DC | PRN

## 2022-09-13 MED ORDER — IPRATROPIUM-ALBUTEROL 0.5-2.5 (3) MG/3ML IN SOLN
3.0000 mL | Freq: Once | RESPIRATORY_TRACT | Status: AC
  Administered 2022-09-13: 3 mL via RESPIRATORY_TRACT
  Filled 2022-09-13: qty 3

## 2022-09-13 MED ORDER — ALBUTEROL SULFATE HFA 108 (90 BASE) MCG/ACT IN AERS
2.0000 | INHALATION_SPRAY | Freq: Once | RESPIRATORY_TRACT | Status: AC
  Administered 2022-09-13: 2 via RESPIRATORY_TRACT
  Filled 2022-09-13: qty 6.7

## 2022-09-13 MED ORDER — OXYCODONE-ACETAMINOPHEN 5-325 MG PO TABS
1.0000 | ORAL_TABLET | Freq: Once | ORAL | Status: AC
  Administered 2022-09-13: 1 via ORAL
  Filled 2022-09-13: qty 1

## 2022-09-13 NOTE — ED Triage Notes (Addendum)
Pt with chest pain mid to left, right shoulder pain off and on for past few days. + SOB, + cough-clear in color per pt. Denies any fevers. + nausea

## 2022-09-13 NOTE — Discharge Instructions (Signed)
You were seen in the emergency department for cough chest pain shortness of breath.  You tested positive for RSV.  We are prescribing you some cough medication and you can continue to use the albuterol 2 puffs every 4 hours as needed.  Tylenol and ibuprofen.  Follow-up with your primary care doctor.  Return to the emergency department if any worsening or concerning symptoms.

## 2022-09-13 NOTE — ED Provider Notes (Signed)
[facility] EMERGENCY DEPARTMENT Provider Note   CSN: [id number] Arrival date & time: 09/13/22  1824     History {Add pertinent medical, surgical, social history, OB history to HPI:1} Chief Complaint  Patient presents with   Chest Pain    Daisy [name] is a 61 y.o. female.  She is here with complaint of pain in her chest back that is been going on for few days.  She has been sick for a few weeks with cough and chest congestion.  Cough productive of some clear sputum.  Symptoms have worsened over the last few days.  Chest pain especially with coughing.  She denies any fevers or chills.  No hemoptysis.  Nausea no vomiting.  She is an active smoker.  The history is provided by the patient.  Chest Pain Pain location:  L chest and R lateral chest Pain quality: aching   Radiates to: bilat flank. Pain severity:  Moderate Onset quality:  Gradual Duration:  2 days Timing:  Constant Progression:  Unchanged Chronicity:  New Context comment:  Coughing Relieved by:  Nothing Worsened by:  Movement, coughing and deep breathing Ineffective treatments:  None tried Associated symptoms: back pain, cough, nausea and shortness of breath   Associated symptoms: no fever and no vomiting   Risk factors: smoking        Home Medications Prior to Admission medications   Medication Sig Start Date End Date Taking? Authorizing Provider  acetaminophen (TYLENOL) 650 MG CR tablet Take 1,300 mg by mouth every 8 (eight) hours as needed for pain.    [provider]  butalbital-acetaminophen-caffeine (FIORICET) 50-325-40 MG tablet Take 1 tablet by mouth every 6 (six) hours as needed for headache. 12/20/21   Kathie Dike, MD  mirabegron ER (MYRBETRIQ) 50 MG TB24 tablet Take 1 tablet (50 mg total) by mouth daily. 04/22/22   McKenzie, Candee Furbish, MD  nitrofurantoin (MACRODANTIN) 50 MG capsule Take 1 capsule (50 mg total) by mouth at bedtime. 03/25/22   McKenzie, Candee Furbish, MD  ondansetron (ZOFRAN-ODT)  4 MG disintegrating tablet Take 2 tablets (8 mg total) by mouth every 8 (eight) hours as needed for nausea or vomiting. 12/25/21   Ameduite, Trenton Gammon, FNP  oxyCODONE (OXY IR/ROXICODONE) 5 MG immediate release tablet Take 1-2 tablets (5-10 mg total) by mouth every 6 (six) hours as needed for moderate pain. 12/30/21   McKenzie, Candee Furbish, MD  polyethylene glycol (MIRALAX / GLYCOLAX) 17 g packet Take 17 g by mouth daily as needed for mild constipation. 04/02/21   Annita Brod, MD      Allergies    Patient has no known allergies.    Review of Systems   Review of Systems  Constitutional:  Negative for fever.  Eyes:  Negative for visual disturbance.  Respiratory:  Positive for cough and shortness of breath.   Cardiovascular:  Positive for chest pain.  Gastrointestinal:  Positive for nausea. Negative for vomiting.  Genitourinary:  Negative for dysuria.  Musculoskeletal:  Positive for back pain.  Skin:  Negative for rash.    Physical Exam Updated Vital Signs BP (!) 147/91   Pulse 88   Temp 98 F (36.7 C) (Oral)   Resp (!) 23   Ht '5\' 3"'$  (1.6 m)   Wt 83.9 kg   SpO2 97%   BMI 32.77 kg/m  Physical Exam Vitals and nursing note reviewed.  Constitutional:      General: She is not in acute distress.    Appearance: She is  well-developed.  HENT:     Head: Normocephalic and atraumatic.  Eyes:     Conjunctiva/sclera: Conjunctivae normal.  Cardiovascular:     Rate and Rhythm: Normal rate and regular rhythm.     Heart sounds: Normal heart sounds. No murmur heard. Pulmonary:     Effort: Pulmonary effort is normal. No respiratory distress.     Breath sounds: Wheezing present.  Abdominal:     Palpations: Abdomen is soft.     Tenderness: There is no abdominal tenderness.  Musculoskeletal:        General: No swelling. Normal range of motion.     Cervical back: Neck supple.     Right lower leg: No tenderness. No edema.     Left lower leg: No tenderness. No edema.  Skin:    General:  Skin is warm and dry.     Capillary Refill: Capillary refill takes less than 2 seconds.  Neurological:     General: No focal deficit present.     Mental Status: She is alert.     ED Results / Procedures / Treatments   Labs (all labs ordered are listed, but only abnormal results are displayed) Labs Reviewed  RESP PANEL BY RT-PCR (RSV, FLU A&B, COVID)  [id number]  BASIC METABOLIC PANEL  CBC  D-DIMER, QUANTITATIVE  TROPONIN I (HIGH SENSITIVITY)    EKG EKG Interpretation  Date/Time:  Saturday September 13 2022 18:34:14 EST Ventricular Rate:  99 PR Interval:  143 QRS Duration: 84 QT Interval:  342 QTC Calculation: 439 R Axis:   65 Text Interpretation: Sinus rhythm Baseline wander in lead(s) V6 No significant change since prior 6/22 Confirmed by Aletta Edouard [id number]) on 09/13/2022 6:37:30 PM  Radiology No results found.  Procedures Procedures  {Document cardiac monitor, telemetry assessment procedure when appropriate:1}  Medications Ordered in ED Medications  ipratropium-albuterol (DUONEB) 0.5-2.5 (3) MG/3ML nebulizer solution 3 mL (has no administration in time range)    ED Course/ Medical Decision Making/ A&P                           Medical Decision Making Amount and/or Complexity of Data Reviewed Labs: ordered. Radiology: ordered.  Risk Prescription drug management.   This patient complains of ***; this involves an extensive number of treatment Options and is a complaint that carries with it a high risk of complications and morbidity. The differential includes ***  I ordered, reviewed and interpreted labs, which included *** I ordered medication *** and reviewed PMP when indicated. I ordered imaging studies which included *** and I independently    visualized and interpreted imaging which showed *** Additional history obtained from *** Previous records obtained and reviewed *** I consulted *** and discussed lab and imaging findings and discussed  disposition.  Cardiac monitoring reviewed, *** Social determinants considered, *** Critical Interventions: ***  After the interventions stated above, I reevaluated the patient and found *** Admission and further testing considered, ***   {Document critical care time when appropriate:1} {Document review of labs and clinical decision tools ie heart score, Chads2Vasc2 etc:1}  {Document your independent review of radiology images, and any outside records:1} {Document your discussion with family members, caretakers, and with consultants:1} {Document social determinants of health affecting pt's care:1} {Document your decision making why or why not admission, treatments were needed:1} Final Clinical Impression(s) / ED Diagnoses Final diagnoses:  None    Rx / DC Orders ED Discharge Orders     None

## 2022-09-18 ENCOUNTER — Ambulatory Visit (INDEPENDENT_AMBULATORY_CARE_PROVIDER_SITE_OTHER): Payer: Medicaid Other | Admitting: Family Medicine

## 2022-09-18 VITALS — BP 139/87 | Wt 178.0 lb

## 2022-09-18 DIAGNOSIS — E039 Hypothyroidism, unspecified: Secondary | ICD-10-CM | POA: Diagnosis not present

## 2022-09-18 DIAGNOSIS — F321 Major depressive disorder, single episode, moderate: Secondary | ICD-10-CM | POA: Diagnosis not present

## 2022-09-18 DIAGNOSIS — E876 Hypokalemia: Secondary | ICD-10-CM

## 2022-09-18 DIAGNOSIS — J205 Acute bronchitis due to respiratory syncytial virus: Secondary | ICD-10-CM | POA: Diagnosis not present

## 2022-09-18 DIAGNOSIS — M2550 Pain in unspecified joint: Secondary | ICD-10-CM | POA: Diagnosis not present

## 2022-09-18 DIAGNOSIS — R7989 Other specified abnormal findings of blood chemistry: Secondary | ICD-10-CM | POA: Diagnosis not present

## 2022-09-18 MED ORDER — ESCITALOPRAM OXALATE 10 MG PO TABS: 10.0000 mg | ORAL_TABLET | Freq: Every day | ORAL | 0 refills | Status: DC

## 2022-09-18 MED ORDER — POTASSIUM CHLORIDE CRYS ER 10 MEQ PO TBCR: 10.0000 meq | EXTENDED_RELEASE_TABLET | Freq: Every day | ORAL | 0 refills | Status: DC

## 2022-09-18 NOTE — Progress Notes (Signed)
   Subjective:    Patient ID: Daisy [name], female    DOB: 12/28/1960, 61 y.o.   MRN: [id number]  HPI Patient arrives to discuss restarting depression and thyroid medication since getting insurance. She has been out of medication for significant length of time.  She is interested in restarting She describes intermittent depression.  Denies being suicidal.  At times she wonders if life is worth living but once again not suicidal Patient would also like referral to rheumatology.  She relates a lot of pain discomfort in her neck her shoulders as well as elbows wrist hips pain and discomfort on a regular basis stiff in the morning difficult time getting out of  Patient was also seen in ER over the weekend and tested positive for RSV has been sick for several weeks with coughing denies high fever chills no wheezing  Review of Systems     Objective:   Physical Exam  General-in no acute distress Eyes-no discharge Lungs-respiratory rate normal, CTA CV-no murmurs,RRR Extremities skin warm dry no edema Neuro grossly normal Behavior normal, alert No swelling in the joints noted      Assessment & Plan:  1. Depression, major, single episode, moderate (HCC) Go ahead with the Lexapro.  Recommend counseling.  Patient not suicidal.  Warning signs were discussed in detail follow-up if problems  - Magnesium - Ambulatory referral to Psychology  2. Elevated serum creatinine Recommend repeat metabolic 7 recommend adequate fluid intake - ANA - C-reactive protein - Sedimentation Rate - TSH + free T4 - Basic Metabolic Panel (7) - Magnesium - Rheumatoid factor  3. Hypokalemia Healthy diet regular fluid intake recheck potassium - ANA - C-reactive protein - Sedimentation Rate - TSH + free T4 - Basic Metabolic Panel (7) - Magnesium - Rheumatoid factor  4. Hypothyroidism, unspecified type Check TSH.  More than likely reinitiate medication await results first - ANA - C-reactive  protein - Sedimentation Rate - TSH + free T4 - Basic Metabolic Panel (7) - Magnesium - Rheumatoid factor  5. Arthralgia, unspecified joint Will check lab work to see if she has possibly polymyalgia rheumatica or possibly another autoimmune illness or could be osteoarthritis for now anti-inflammatory as needed - ANA - C-reactive protein - Sedimentation Rate - TSH + free T4 - Basic Metabolic Panel (7) - Magnesium - Rheumatoid factor  6. RSV bronchitis Healthy diet warning signs regarding respiratory distress discussed no sign of pneumonia stay away from others until feeling better - potassium chloride (KLOR-CON M) 10 MEQ tablet; Take 1 tablet (10 mEq total) by mouth daily for 14 days.  Dispense: 14 tablet; Refill: 0 - escitalopram (LEXAPRO) 10 MG tablet; Take 1 tablet (10 mg total) by mouth daily for 10 days.  Dispense: 10 tablet; Refill: 0

## 2022-09-19 LAB — BASIC METABOLIC PANEL (7)
BUN/Creatinine Ratio: 15 (ref 12–28)
BUN: 18 mg/dL (ref 8–27)
CO2: 23 mmol/L (ref 20–29)
Chloride: 101 mmol/L (ref 96–106)
Creatinine, Ser: 1.19 mg/dL — ABNORMAL HIGH (ref 0.57–1.00)
Glucose: 100 mg/dL — ABNORMAL HIGH (ref 70–99)
Potassium: 5.3 mmol/L — ABNORMAL HIGH (ref 3.5–5.2)
Sodium: 139 mmol/L (ref 134–144)
eGFR: 52 mL/min/{1.73_m2} — ABNORMAL LOW (ref >59)

## 2022-09-19 LAB — TSH+FREE T4
Free T4: 0.88 ng/dL (ref 0.82–1.77)
TSH: 4.04 u[IU]/mL (ref 0.450–4.500)

## 2022-09-19 LAB — RHEUMATOID FACTOR: Rheumatoid fact SerPl-aCnc: <10 IU/mL (ref <14.0)

## 2022-09-19 LAB — C-REACTIVE PROTEIN: CRP: 6 mg/L (ref 0–10)

## 2022-09-19 LAB — MAGNESIUM: Magnesium: 2.2 mg/dL (ref 1.6–2.3)

## 2022-09-19 LAB — ANA: Anti Nuclear Antibody (ANA): POSITIVE — AB

## 2022-09-19 LAB — SEDIMENTATION RATE: Sed Rate: 20 mm/hr (ref 0–40)

## 2022-09-22 NOTE — Addendum Note (Signed)
Addended by: Dairl Ponder on: 09/22/2022 09:09 AM   Modules accepted: Orders

## 2022-09-23 ENCOUNTER — Encounter: Payer: Self-pay | Admitting: Urology

## 2022-09-23 ENCOUNTER — Ambulatory Visit: Payer: Medicaid Other | Admitting: Urology

## 2022-09-25 ENCOUNTER — Other Ambulatory Visit: Payer: Self-pay | Admitting: Family Medicine

## 2022-09-25 ENCOUNTER — Encounter: Payer: Self-pay | Admitting: Family Medicine

## 2022-09-25 DIAGNOSIS — F321 Major depressive disorder, single episode, moderate: Secondary | ICD-10-CM

## 2022-09-25 MED ORDER — ESCITALOPRAM OXALATE 10 MG PO TABS: ORAL_TABLET | ORAL | 1 refills | Status: DC

## 2022-09-25 MED ORDER — CLONAZEPAM 0.5 MG PO TABS: 0.5000 mg | ORAL_TABLET | Freq: Two times a day (BID) | ORAL | 0 refills | Status: DC | PRN

## 2022-09-26 ENCOUNTER — Ambulatory Visit: Payer: Medicaid Other | Admitting: Urology

## 2022-10-10 ENCOUNTER — Other Ambulatory Visit: Payer: Self-pay | Admitting: Family Medicine

## 2022-10-16 ENCOUNTER — Ambulatory Visit (INDEPENDENT_AMBULATORY_CARE_PROVIDER_SITE_OTHER): Payer: Medicaid Other | Admitting: Family Medicine

## 2022-10-16 ENCOUNTER — Encounter: Payer: Self-pay | Admitting: Family Medicine

## 2022-10-16 VITALS — BP 112/70 | Wt 178.2 lb

## 2022-10-16 DIAGNOSIS — Z1211 Encounter for screening for malignant neoplasm of colon: Secondary | ICD-10-CM | POA: Diagnosis not present

## 2022-10-16 DIAGNOSIS — N1831 Chronic kidney disease, stage 3a: Secondary | ICD-10-CM | POA: Diagnosis not present

## 2022-10-16 DIAGNOSIS — R6889 Other general symptoms and signs: Secondary | ICD-10-CM | POA: Diagnosis not present

## 2022-10-16 DIAGNOSIS — F321 Major depressive disorder, single episode, moderate: Secondary | ICD-10-CM | POA: Diagnosis not present

## 2022-10-16 DIAGNOSIS — N3000 Acute cystitis without hematuria: Secondary | ICD-10-CM | POA: Diagnosis not present

## 2022-10-16 DIAGNOSIS — F332 Major depressive disorder, recurrent severe without psychotic features: Secondary | ICD-10-CM | POA: Insufficient documentation

## 2022-10-16 DIAGNOSIS — R300 Dysuria: Secondary | ICD-10-CM | POA: Diagnosis not present

## 2022-10-16 DIAGNOSIS — G542 Cervical root disorders, not elsewhere classified: Secondary | ICD-10-CM

## 2022-10-16 LAB — POCT URINALYSIS DIP (CLINITEK)
Nitrite, UA: POSITIVE — AB
Spec Grav, UA: 1.02 (ref 1.010–1.025)
pH, UA: 6 (ref 5.0–8.0)

## 2022-10-16 MED ORDER — CEPHALEXIN 500 MG PO CAPS: 500.0000 mg | ORAL_CAPSULE | Freq: Three times a day (TID) | ORAL | 0 refills | Status: DC

## 2022-10-16 NOTE — Progress Notes (Signed)
   Subjective:    Patient ID: Daisy [name], female    DOB: 04/25/1961, 62 y.o.   MRN: [id number]  HPI Pt arrives to follow up on depression. Pt taking Lexapro at bedtime due to when she took it during the day it made her feel funny. Pt states she is not sure if it is working, seems to be, due to having a lot going on. Patient not suicidal Stressful situation at home  Continued cough, headache, sore throat, aches, no energy.  She relates over the past few days some head congestion not feeling good In addition to this and UTI symptoms  Review of Systems     Objective:   Physical Exam General-in no acute distress Eyes-no discharge Lungs-respiratory rate normal, CTA CV-no murmurs,RRR Extremities skin warm dry no edema Neuro grossly normal Behavior normal, alert        Assessment & Plan:  Ongoing depression.  Continue Lexapro.  Start counseling soon which will help Under a lot of stress at home Her spouse has a daughter who has drug addiction problems this creates a lot of stress.  The patient herself is not suicidal.  She is trying to do the best she can with coping but counseling will help continue medication  Arthralgias pains and discomfort to seeing rheumatology if they do not find a rheumatologic disease more than likely they will return her right back to [place] patient aware  1. Cervical nerve root impingement Cervical impingement. - Ambulatory referral to Neurology  2. Depression, major, single episode, moderate ([place]) Counseling as planned Lexapro ongoing  3. Stage 3a chronic kidney disease ([place]) Identified on blood work check urine albumin creatinine ratio. - Microalbumin/Creatinine Ratio, Urine  4. Flu-like symptoms Patient with head cold congestion body aches not feeling good go ahead with triple swab - COVID-19, Flu A+B and RSV  5. Dysuria Patient with dysuria under the microscope WBCs we will go ahead and treat for UTI - POCT URINALYSIS DIP (CLINITEK)  6.  Encounter for screening colonoscopy Screening colonoscopy recommended she is behind on this - Ambulatory referral to Gastroenterology  UTI-antibiotics prescribed warning signs discussed  Recheck approximately 8 weeks from now

## 2022-10-16 NOTE — Progress Notes (Signed)
10/16/22-referral corrected

## 2022-10-17 ENCOUNTER — Encounter: Payer: Self-pay | Admitting: Family Medicine

## 2022-10-17 ENCOUNTER — Other Ambulatory Visit: Payer: Self-pay | Admitting: Family Medicine

## 2022-10-17 LAB — COVID-19, FLU A+B AND RSV
Influenza A, NAA: NOT DETECTED
Influenza B, NAA: NOT DETECTED
RSV, NAA: NOT DETECTED
SARS-CoV-2, NAA: NOT DETECTED

## 2022-10-17 LAB — MICROALBUMIN / CREATININE URINE RATIO
Creatinine, Urine: 147.4 mg/dL
Microalb/Creat Ratio: 22 mg/g creat (ref 0–29)
Microalbumin, Urine: 32.8 ug/mL

## 2022-10-17 MED ORDER — HYDROCODONE-ACETAMINOPHEN 5-325 MG PO TABS: 1.0000 | ORAL_TABLET | Freq: Four times a day (QID) | ORAL | 0 refills | Status: AC | PRN

## 2022-10-17 NOTE — Telephone Encounter (Signed)
Nurses A limited amount of hydrocodone was called in for send pain do not take with clonazepam.  For home use only for pain not for long-term use  Also as per the blister very difficult to diagnose without seeing him.  Still smoking if she develops more blisters or has a larger area it would be highly important to be seen at urgent care or with our office   may try warm salt water gargles.

## 2022-10-20 ENCOUNTER — Encounter (INDEPENDENT_AMBULATORY_CARE_PROVIDER_SITE_OTHER): Payer: Self-pay | Admitting: *Deleted

## 2022-10-21 ENCOUNTER — Encounter: Payer: Self-pay | Admitting: *Deleted

## 2022-10-24 ENCOUNTER — Ambulatory Visit (INDEPENDENT_AMBULATORY_CARE_PROVIDER_SITE_OTHER): Payer: Medicaid Other | Admitting: Urology

## 2022-10-24 ENCOUNTER — Other Ambulatory Visit: Payer: Self-pay | Admitting: Family Medicine

## 2022-10-24 ENCOUNTER — Encounter: Payer: Self-pay | Admitting: Urology

## 2022-10-24 VITALS — BP 106/75 | HR 103

## 2022-10-24 DIAGNOSIS — N3281 Overactive bladder: Secondary | ICD-10-CM

## 2022-10-24 DIAGNOSIS — N390 Urinary tract infection, site not specified: Secondary | ICD-10-CM

## 2022-10-24 DIAGNOSIS — R3129 Other microscopic hematuria: Secondary | ICD-10-CM

## 2022-10-24 LAB — URINALYSIS, ROUTINE W REFLEX MICROSCOPIC
Bilirubin, UA: NEGATIVE
Glucose, UA: NEGATIVE
Ketones, UA: NEGATIVE
Leukocytes,UA: NEGATIVE
Nitrite, UA: NEGATIVE
Protein,UA: NEGATIVE
Specific Gravity, UA: <1.005 — ABNORMAL LOW (ref 1.005–1.030)
Urobilinogen, Ur: 0.2 mg/dL (ref 0.2–1.0)
pH, UA: 6 (ref 5.0–7.5)

## 2022-10-24 LAB — MICROSCOPIC EXAMINATION: Bacteria, UA: NONE SEEN

## 2022-10-24 MED ORDER — MIRABEGRON ER 50 MG PO TB24: 50.0000 mg | ORAL_TABLET | Freq: Every day | ORAL | 11 refills | Status: DC

## 2022-10-24 MED ORDER — NITROFURANTOIN MACROCRYSTAL 50 MG PO CAPS: 50.0000 mg | ORAL_CAPSULE | Freq: Every day | ORAL | 11 refills | Status: DC

## 2022-10-24 MED ORDER — ONDANSETRON HCL 4 MG PO TABS: 4.0000 mg | ORAL_TABLET | Freq: Three times a day (TID) | ORAL | 0 refills | Status: DC | PRN

## 2022-10-24 MED ORDER — CYCLOBENZAPRINE HCL 5 MG PO TABS: 5.0000 mg | ORAL_TABLET | Freq: Three times a day (TID) | ORAL | 0 refills | Status: DC | PRN

## 2022-10-24 NOTE — Patient Instructions (Signed)

## 2022-10-24 NOTE — Progress Notes (Unsigned)
10/24/2022 12:32 PM   Daisy [name] 08/07/1961 [id number]  Referring provider: Kathyrn [name], [place],  [place] [address]  Followup recurrent UTI  HPI: [name] is a 62yo here for followup for OAB and recurrent UTI. She was treated with keflex for a UTI 1 week ago.  She stopped her macrodantin since last visit. She stopped mirabegron '50mg'$  since last visit since she did no have insurance.    PMH: Past Medical History:  Diagnosis Date   Chronic pain syndrome    Depression    Fatigue    Fibromyalgia    Normal cardiac stress test 2002   Thyroid disease     Surgical History: Past Surgical History:  Procedure Laterality Date   APPENDECTOMY     CESAREAN SECTION     twice '84 and '89   [place]  2004 and 2005   twice  same site after cholecystectomy   NASAL SINUS SURGERY     twice -enlarged sinuses and cleared scar tissue   TONSILLECTOMY      Home Medications:  Allergies as of 10/24/2022   No Known Allergies      Medication List        Accurate as of October 24, 2022 12:32 PM. If you have any questions, ask your nurse or doctor.          cephALEXin 500 MG capsule Commonly known as: KEFLEX Take 1 capsule (500 mg total) by mouth 3 (three) times daily.   chlorpheniramine-HYDROcodone 10-8 MG/5ML Commonly known as: TUSSIONEX Take 5 mLs by mouth at bedtime as needed for cough.   clonazePAM 0.5 MG tablet Commonly known as: KLONOPIN Take 1 tablet by mouth twice daily as needed for anxiety   escitalopram 10 MG tablet Commonly known as: Lexapro 1 qd   ondansetron 4 MG disintegrating tablet Commonly known as: ZOFRAN-ODT Take 2 tablets (8 mg total) by mouth every 8 (eight) hours as needed for nausea or vomiting.   potassium chloride 10 MEQ tablet Commonly known as: KLOR-CON M Take 1 tablet (10 mEq total) by mouth daily for 14 days.         Allergies: No Known Allergies  Family History: Family History  Problem Relation Age of Onset   Heart disease Father     Social History:  reports that she has been smoking cigarettes. She has been smoking an average of 1 pack per day. She has never used smokeless tobacco. She reports current drug use. Drug: Marijuana. She reports that she does not drink alcohol.  ROS: All other review of systems were reviewed and are negative except what is noted above in HPI  Physical Exam: BP 106/75   Pulse (!) 103   Constitutional:  Alert and oriented, No acute distress. HEENT: [place] AT, moist mucus membranes.  Trachea midline, no masses. Cardiovascular: No clubbing, cyanosis, or edema. Respiratory: Normal respiratory effort, no increased work of breathing. GI: Abdomen is soft, nontender, nondistended, no abdominal masses GU: No CVA tenderness.  Lymph: No cervical or inguinal lymphadenopathy. Skin: No rashes, bruises or suspicious lesions. Neurologic: Grossly intact, no focal deficits, moving all 4 extremities. Psychiatric: Normal mood and affect.  Laboratory Data: Lab Results  Component Value Date   WBC 10.4 09/13/2022   HGB 13.6 09/13/2022   HCT 41.3 09/13/2022   MCV 85.0 09/13/2022   PLT 215 09/13/2022  Lab Results  Component Value Date   CREATININE 1.19 (H) 09/18/2022    No results found for: "PSA"  No results found for: "TESTOSTERONE"  No results found for: "HGBA1C"  Urinalysis    Component Value Date/Time   COLORURINE YELLOW 12/16/2021 1155   APPEARANCEUR Clear 12/30/2021 1109   LABSPEC 1.019 12/16/2021 1155   PHURINE 6.0 12/16/2021 1155   GLUCOSEU Negative 12/30/2021 1109   HGBUR MODERATE (A) 12/16/2021 1155   BILIRUBINUR Negative 12/30/2021 1109   KETONESUR NEGATIVE 12/16/2021 1155   PROTEINUR Negative 12/30/2021 1109   PROTEINUR 100 (A) 12/16/2021 1155   UROBILINOGEN 0.2 03/08/2015 2027   NITRITE Positive (A) 10/16/2022 1030   NITRITE Negative 12/30/2021  1109   NITRITE NEGATIVE 12/16/2021 1155   LEUKOCYTESUR 4+ (A) 10/16/2022 1030   LEUKOCYTESUR Negative 12/30/2021 1109   LEUKOCYTESUR SMALL (A) 12/16/2021 1155    Lab Results  Component Value Date   LABMICR 32.8 10/16/2022   WBCUA 0-5 12/30/2021   LABEPIT None seen 12/30/2021   BACTERIA Few 12/30/2021    Pertinent Imaging:  No results found for this or any previous visit.  No results found for this or any previous visit.  No results found for this or any previous visit.  No results found for this or any previous visit.  Results for orders placed in visit on 12/30/21  Ultrasound renal complete  Narrative CLINICAL DATA:  LEFT renal hematoma, renal abscess, follow-up  EXAM: RENAL / URINARY TRACT ULTRASOUND COMPLETE  COMPARISON:  CT abdomen and pelvis 12/16/2021, renal ultrasound  FINDINGS: Right Kidney:  Renal measurements: 10.2 x 3.9 x 5.4 cm = volume: 112 mL. Tiny subcapsular cyst RIGHT kidney 10 mm greatest diameter. No additional renal mass, hydronephrosis, or shadowing calcification.  Left Kidney:  Renal measurements: 11.9 x 7.1 x 8.2 cm = volume: 363 mL. Intraparenchymal hypoechoic region within lateral mid inferior LEFT kidney, 4.1 cm greatest size, corresponding to the intraparenchymal portion of the hematoma seen on the prior exam. This is contiguous with a larger perinephric/subcapsular collection identified at the mid inferior LEFT kidney measuring 7.5 x 4.8 x 3.8 cm, corresponding to the larger com pollen of hematoma seen on the prior CT. This collection demonstrates foci of increased echogenicity concerning for foci of gas. Mildly dilated collecting system as seen on prior CT. No additional masses.  Bladder:  Appears normal for degree of bladder distention.  Other:  None.  IMPRESSION: Mildly dilated LEFT renal collecting system as seen on prior CT.  4.1 cm intraparenchymal hematoma lateral mid inferior LEFT kidney contiguous with a 7.5 x  4.8 x 3.8 cm complex collection at the lateral aspect and the mid inferior pole LEFT kidney, by prior CT a subcapsular hematoma.  New foci of punctate increased echogenicity are seen within these collections, concerning for foci of gas, question abscess.  Repeat CT imaging with IV contrast recommended.  Findings discussed with Dr. Alyson Ingles on 12/30/2021 at 1246 hours.   Electronically Signed By: Lavonia Dana M.D. On: 12/30/2021 12:47  No valid procedures specified. No results found for this or any previous visit.  No results found for this or any previous visit.   Assessment & Plan:    1. Recurrent UTI -restart macrodantin '50mg'$  QHS - Urinalysis, Routine w reflex microscopic  2. Microscopic hematuria -followup 6 months  3. OAB -restart mirabegron '50mg'$     No follow-ups on file.  Nicolette Bang, MD  [facility] [place]

## 2022-10-28 ENCOUNTER — Other Ambulatory Visit: Payer: Self-pay | Admitting: Family Medicine

## 2022-10-28 MED ORDER — CLONAZEPAM 0.5 MG PO TABS: 0.5000 mg | ORAL_TABLET | Freq: Two times a day (BID) | ORAL | 0 refills | Status: DC | PRN

## 2022-10-29 ENCOUNTER — Encounter: Payer: Self-pay | Admitting: Urology

## 2022-11-03 ENCOUNTER — Other Ambulatory Visit: Payer: Self-pay | Admitting: Urology

## 2022-11-03 DIAGNOSIS — N3281 Overactive bladder: Secondary | ICD-10-CM

## 2022-11-09 ENCOUNTER — Other Ambulatory Visit: Payer: Self-pay | Admitting: Family Medicine

## 2022-11-11 ENCOUNTER — Other Ambulatory Visit: Payer: Self-pay | Admitting: Family Medicine

## 2022-11-11 ENCOUNTER — Encounter: Payer: Self-pay | Admitting: Family Medicine

## 2022-11-11 NOTE — Telephone Encounter (Signed)
Nurses Medications such as clonazepam is more meant to be an occasional medication not being relatively regular medicine.  Nerve medications can become habit-forming.  I would suggest we do a follow-up visit with Daisy [name] to see how she is doing and discuss with her how her nerves/moods are doing.  1 option would be counseling that we could set up.  Another option would be adjusting the dose of the Lexapro.  Please communicate with her.  See if we can help set her up an appointment within the next 2 weeks thank you

## 2022-11-12 ENCOUNTER — Other Ambulatory Visit: Payer: Self-pay | Admitting: Urology

## 2022-11-12 DIAGNOSIS — N3281 Overactive bladder: Secondary | ICD-10-CM

## 2022-11-13 ENCOUNTER — Other Ambulatory Visit: Payer: Self-pay | Admitting: Urology

## 2022-11-13 DIAGNOSIS — N3281 Overactive bladder: Secondary | ICD-10-CM

## 2022-11-13 NOTE — Telephone Encounter (Signed)
Please refill rx below if appropriate.

## 2022-11-15 ENCOUNTER — Other Ambulatory Visit: Payer: Self-pay | Admitting: Urology

## 2022-11-15 DIAGNOSIS — N3281 Overactive bladder: Secondary | ICD-10-CM

## 2022-11-16 NOTE — Telephone Encounter (Signed)
Klonopin was never meant to be a regular medicine.  I would recommend a follow-up office visit if she feels she needs ongoing anxiety treatment.  We typically do not like using nerve pills regularly-other medications are more indicated based on current standards

## 2022-11-17 MED ORDER — CYCLOBENZAPRINE HCL 5 MG PO TABS: 5.0000 mg | ORAL_TABLET | Freq: Three times a day (TID) | ORAL | 0 refills | Status: DC | PRN

## 2022-11-19 ENCOUNTER — Other Ambulatory Visit: Payer: Self-pay | Admitting: Family Medicine

## 2022-11-19 DIAGNOSIS — F321 Major depressive disorder, single episode, moderate: Secondary | ICD-10-CM

## 2022-12-03 ENCOUNTER — Encounter (HOSPITAL_COMMUNITY): Payer: Self-pay

## 2022-12-03 ENCOUNTER — Ambulatory Visit (INDEPENDENT_AMBULATORY_CARE_PROVIDER_SITE_OTHER): Payer: Medicaid Other | Admitting: Clinical

## 2022-12-03 DIAGNOSIS — F3132 Bipolar disorder, current episode depressed, moderate: Secondary | ICD-10-CM

## 2022-12-03 DIAGNOSIS — F411 Generalized anxiety disorder: Secondary | ICD-10-CM

## 2022-12-03 DIAGNOSIS — F4310 Post-traumatic stress disorder, unspecified: Secondary | ICD-10-CM | POA: Diagnosis not present
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
## 2022-12-05 ENCOUNTER — Encounter: Payer: Self-pay | Admitting: Family Medicine

## 2022-12-05 DIAGNOSIS — F321 Major depressive disorder, single episode, moderate: Secondary | ICD-10-CM

## 2022-12-05 NOTE — Telephone Encounter (Signed)
Nurses Please go ahead with referral as requested for anxiety The specialist there will take over management of mental health We are certainly here for any other health concerns (Please share with Barbera Setters) Thanks so much-Dr. Sallee Lange

## 2022-12-09 ENCOUNTER — Other Ambulatory Visit: Payer: Self-pay | Admitting: Family Medicine

## 2022-12-09 DIAGNOSIS — Z1231 Encounter for screening mammogram for malignant neoplasm of breast: Secondary | ICD-10-CM

## 2022-12-15 ENCOUNTER — Ambulatory Visit: Payer: Medicaid Other | Admitting: Family Medicine

## 2022-12-16 ENCOUNTER — Other Ambulatory Visit: Payer: Self-pay | Admitting: Urology

## 2022-12-16 DIAGNOSIS — N3281 Overactive bladder: Secondary | ICD-10-CM

## 2022-12-28 NOTE — Progress Notes (Unsigned)
Office Visit Note  Patient: Daisy [name]             Date of Birth: June 04, 1961           MRN: [id number]             PCP: Kathyrn Drown, MD Referring: Kathyrn Drown, MD Visit Date: 12/29/2022 Occupation: @GUAROCC @  Subjective:  No chief complaint on file.   History of Present Illness: Daisy [name] is a 62 y.o. female here for evaluation of joint pain and stiffness and hand swelling with positive ANA. ***   Labs reviewed 09/2022 ANA pos RF neg ESR 20 CRP 6 BMP eGFR 52   Activities of Daily Living:  Patient reports morning stiffness for *** {minute/hour:19697}.   Patient {ACTIONS;DENIES/REPORTS:21021675::"Denies"} nocturnal pain.  Difficulty dressing/grooming: {ACTIONS;DENIES/REPORTS:21021675::"Denies"} Difficulty climbing stairs: {ACTIONS;DENIES/REPORTS:21021675::"Denies"} Difficulty getting out of chair: {ACTIONS;DENIES/REPORTS:21021675::"Denies"} Difficulty using hands for taps, buttons, cutlery, and/or writing: {ACTIONS;DENIES/REPORTS:21021675::"Denies"}  No Rheumatology ROS completed.   PMFS History:  Patient Active Problem List   Diagnosis Date Noted   Depression, major, single episode, moderate ([place]) 10/16/2022   Stage 3a chronic kidney disease ([place]) 10/16/2022   Sepsis due to undetermined organism ([place]) 12/17/2021   Hypoxia 12/17/2021   Tobacco abuse 12/17/2021   Renal hematoma, left 10/23/2021   Microscopic hematuria 10/23/2021   Flank pain 10/23/2021   Obesity (BMI 30-39.9) 03/31/2021   SIRS (systemic inflammatory response syndrome) ([place]) 03/30/2021   Recurrent UTI 03/30/2021   Acute renal failure superimposed on stage 3a chronic kidney disease ([place]) 03/30/2021   Frequent headaches 12/06/2018   Analgesic rebound headache 12/06/2018   Cervical pain 12/06/2018   Arthralgia of both hands 12/06/2018   Prehypertension 06/02/2013   Hypothyroidism 04/29/2013   Chronic pain syndrome 04/29/2013   Other malaise and fatigue 03/02/2013   Elevated  blood pressure reading 02/17/2013   Depression 02/17/2013   Fibromyalgia 02/17/2013   Insomnia 02/17/2013    Past Medical History:  Diagnosis Date   Chronic pain syndrome    Depression    Fatigue    Fibromyalgia    Normal cardiac stress test 2002   Thyroid disease     Family History  Problem Relation Age of Onset   Heart disease Father    Past Surgical History:  Procedure Laterality Date   APPENDECTOMY     CESAREAN SECTION     twice '84 and '89   [place]  2004 and 2005   twice  same site after cholecystectomy   NASAL SINUS SURGERY     twice -enlarged sinuses and cleared scar tissue   TONSILLECTOMY     Social History   Social History Narrative   Not on file   Immunization History  Administered Date(s) Administered   Moderna Sars-Covid-2 Vaccination 01/25/2020   PNEUMOCOCCAL CONJUGATE-20 12/17/2021     Objective: Vital Signs: There were no vitals taken for this visit.   Physical Exam   Musculoskeletal Exam: ***  CDAI Exam: CDAI Score: -- Patient Global: --; Provider Global: -- Swollen: --; Tender: -- Joint Exam 12/29/2022   No joint exam has been documented for this visit   There is currently no information documented on the homunculus. Go to the Rheumatology activity and complete the homunculus joint exam.  Investigation: No additional findings.  Imaging: No results found.  Recent Labs: Lab Results  Component Value Date   WBC 10.4 09/13/2022  HGB 13.6 09/13/2022   PLT 215 09/13/2022   NA 139 09/18/2022   K 5.3 (H) 09/18/2022   CL 101 09/18/2022   CO2 23 09/18/2022   GLUCOSE 100 (H) 09/18/2022   BUN 18 09/18/2022   CREATININE 1.19 (H) 09/18/2022   BILITOT 0.2 12/25/2021   ALKPHOS 77 12/25/2021   AST 27 12/25/2021   ALT 24 12/25/2021   PROT 7.2 12/25/2021   ALBUMIN 3.7 (L) 12/25/2021   CALCIUM 9.0 09/13/2022   GFRAA 65 04/13/2019    Speciality Comments: No  specialty comments available.  Procedures:  No procedures performed Allergies: Patient has no known allergies.   Assessment / Plan:     Visit Diagnoses: No diagnosis found.  Orders: No orders of the defined types were placed in this encounter.  No orders of the defined types were placed in this encounter.   Face-to-face time spent with patient was *** minutes. Greater than 50% of time was spent in counseling and coordination of care.  Follow-Up Instructions: No follow-ups on file.   Collier Salina, MD  Note - This record has been created using [employer].  Chart creation errors have been sought, but may not always  have been located. Such creation errors do not reflect on  the standard of medical care.

## 2022-12-29 ENCOUNTER — Ambulatory Visit: Payer: Medicaid Other | Attending: Internal Medicine | Admitting: Internal Medicine

## 2022-12-29 ENCOUNTER — Ambulatory Visit: Payer: Medicaid Other

## 2022-12-29 ENCOUNTER — Ambulatory Visit (INDEPENDENT_AMBULATORY_CARE_PROVIDER_SITE_OTHER): Payer: Medicaid Other

## 2022-12-29 ENCOUNTER — Encounter: Payer: Self-pay | Admitting: Internal Medicine

## 2022-12-29 VITALS — BP 159/89 | HR 80 | Resp 16 | Ht 63.0 in | Wt 184.0 lb

## 2022-12-29 DIAGNOSIS — M25541 Pain in joints of right hand: Secondary | ICD-10-CM

## 2022-12-29 DIAGNOSIS — R768 Other specified abnormal immunological findings in serum: Secondary | ICD-10-CM

## 2022-12-29 DIAGNOSIS — Z720 Tobacco use: Secondary | ICD-10-CM | POA: Diagnosis not present

## 2022-12-29 DIAGNOSIS — M797 Fibromyalgia: Secondary | ICD-10-CM | POA: Diagnosis not present

## 2022-12-29 DIAGNOSIS — M159 Polyosteoarthritis, unspecified: Secondary | ICD-10-CM | POA: Insufficient documentation

## 2022-12-29 DIAGNOSIS — M25542 Pain in joints of left hand: Secondary | ICD-10-CM

## 2022-12-29 DIAGNOSIS — R7689 Other specified abnormal immunological findings in serum: Secondary | ICD-10-CM

## 2022-12-29 DIAGNOSIS — N1831 Chronic kidney disease, stage 3a: Secondary | ICD-10-CM

## 2022-12-29 NOTE — Patient Instructions (Addendum)
For osteoarthritis several treatments may be beneficial:  - Topical antiinflammatory medicine such as diclofenac or Voltaren can be applied to  affected area as needed. Topical analgesics containing CBD, menthol, or lidocaine can be tried.  - Turmeric has some antiinflammatory effect similar to NSAIDs and may help, if taken as a supplement should not be taken above recommended doses.   - Compressive gloves or sleeve can be helpful to support the joint especially if hurting or swelling with certain activities.  - Physical therapy referral can discuss exercises or activity modification to improve symptoms or strength if needed.  - Local steroid injection is an option if symptoms become worse and not controlled by the above options.    Gluteus Medius Syndrome Rehab Ask your health care provider which exercises are safe for you. Do exercises exactly as told by your provider and adjust them as directed. It is normal to feel mild stretching, pulling, tightness, or discomfort as you do these exercises. Stop right away if you feel sudden pain or your pain gets worse. Do not begin these exercises until told by your provider. Stretching and range-of-motion exercise This exercise warms up your muscles and joints and improves the movement and flexibility of your hip and pelvis. This exercise also helps to relieve muscle and joint pain and stiffness. Lunge This exercise is also called hip flexor stretch. Kneel on the floor on your left / right knee. Bend your other knee so it is directly over your ankle. Keep good posture with your head over your shoulders. Tuck your tailbone underneath you. This will prevent your back from arching too much. You should feel a gentle stretch in the front of your back thigh or hip (hip flexors). If you do not feel a stretch, slowly lunge forward with your chest up. Hold this position for __________ seconds. Slowly return to the starting position. Repeat __________ times.  Complete this exercise __________ times a day. Strengthening exercises These exercises build strength and endurance in your hip and pelvis. Endurance is the ability to use your muscles for a long time, even after they get tired. Straight leg raises, side-lying This exercise strengthens the muscles that rotate the leg at the hip and move it away from your body (hip abductors). Lie on your side with your left / right leg in the top position. Lie so your head, shoulder, knee, and hip line up. Bend your bottom knee slightly to help you balance. Lift your top leg 4-6 inches (10-15 cm) while keeping your toes pointed straight ahead. Hold this position for __________ seconds. Slowly lower your leg to the starting position. Let your muscles relax completely after each repetition. Repeat __________ times. Complete this exercise __________ times a day. Hip abduction, quadruped This is an exercise in which your hands and knees are on the floor, and you lift one knee out to the side. Get on your hands and knees on a firm, lightly padded surface. Your hands should be directly below your shoulders, and your knees should be directly below your hips. Lift your left / right knee out to the side. Keep your knee bent. Do not twist your body. Hold this position for __________ seconds. Slowly lower your leg back to the starting position. Repeat __________ times. Complete this exercise __________ times a day. Single leg stand  Stand near a counter or door frame. Hold on to it as needed. It is helpful to look in a mirror for this exercise so you can watch your hip.  Squeeze your left / right butt muscles, bend your knee and move your foot toward your butt. Do not let your left / right hip push out to the side. Hold this position for __________ seconds. Repeat __________ times. Complete this exercise __________ times a day. This information is not intended to replace advice given to you by your health care provider.  Make sure you discuss any questions you have with your health care provider. Document Revised: 04/30/2022 Document Reviewed: 04/30/2022 Elsevier Patient Education  [employer].

## 2022-12-31 LAB — SJOGRENS SYNDROME-B EXTRACTABLE NUCLEAR ANTIBODY: SSB (La) (ENA) Antibody, IgG: <1 AI

## 2022-12-31 LAB — SJOGRENS SYNDROME-A EXTRACTABLE NUCLEAR ANTIBODY: SSA (Ro) (ENA) Antibody, IgG: <1 AI

## 2022-12-31 LAB — ANTI-DNA ANTIBODY, DOUBLE-STRANDED: ds DNA Ab: 9 IU/mL — ABNORMAL HIGH

## 2022-12-31 LAB — C3 AND C4
C3 Complement: 129 mg/dL (ref 83–193)
C4 Complement: 35 mg/dL (ref 15–57)

## 2022-12-31 LAB — RNP ANTIBODY: Ribonucleic Protein(ENA) Antibody, IgG: <1 AI

## 2022-12-31 LAB — ANTI-SMITH ANTIBODY: ENA SM Ab Ser-aCnc: <1 AI

## 2022-12-31 NOTE — Progress Notes (Signed)
dsDNA reslt is 9 which is borderline positive but all the other antibody tests are negative. I don't think this really indicates she has lupus. Xray of the hands shows pretty severe osteoarthritis of the thumb and moderately osteoarthritis in other finger joints. We can still follow up to discuss other options for her osteoarthritis and hip pain if not improving such as injections or physical therapy.

## 2023-01-02 ENCOUNTER — Other Ambulatory Visit: Payer: Self-pay | Admitting: Urology

## 2023-01-02 DIAGNOSIS — N3281 Overactive bladder: Secondary | ICD-10-CM

## 2023-01-05 DIAGNOSIS — L03311 Cellulitis of abdominal wall: Secondary | ICD-10-CM | POA: Diagnosis not present

## 2023-01-05 DIAGNOSIS — E669 Obesity, unspecified: Secondary | ICD-10-CM | POA: Diagnosis not present

## 2023-01-05 DIAGNOSIS — M722 Plantar fascial fibromatosis: Secondary | ICD-10-CM | POA: Diagnosis not present

## 2023-01-05 DIAGNOSIS — Z6833 Body mass index (BMI) 33.0-33.9, adult: Secondary | ICD-10-CM | POA: Diagnosis not present

## 2023-01-07 ENCOUNTER — Ambulatory Visit (INDEPENDENT_AMBULATORY_CARE_PROVIDER_SITE_OTHER): Payer: Medicaid Other | Admitting: Clinical

## 2023-01-07 DIAGNOSIS — F411 Generalized anxiety disorder: Secondary | ICD-10-CM

## 2023-01-07 DIAGNOSIS — F3132 Bipolar disorder, current episode depressed, moderate: Secondary | ICD-10-CM | POA: Diagnosis not present

## 2023-01-07 DIAGNOSIS — F4310 Post-traumatic stress disorder, unspecified: Secondary | ICD-10-CM

## 2023-01-07 NOTE — Progress Notes (Signed)
IN PERSON  I connected with Daisy [name] on 01/07/23 at 10:00 AM EDT in person and verified that I am speaking with the correct person using two identifiers.  Location: Patient: Office Provider: Office    I discussed the limitations of evaluation and management by telemedicine and the availability of in person appointments. The patient expressed understanding and agreed to proceed. ( IN PERSON)   THERAPIST PROGRESS NOTE   Session Time: 10:00 AM-10:45 AM   Participation Level: Active   Behavioral Response: CasualAlertDepressed   Type of Therapy: Individual Therapy   Treatment Goals addressed: Coping   Interventions: CBT   Summary: Daisy [name] is a 62 y.o. female who presents with Bipolar 1 disorder, depressed, moderate / PTSD/ GAD. The OPT therapist worked with the patient for her ongoing OPT treatment session. The OPT therapist utilized Motivational Interviewing to assist in creating therapeutic repore. The patient in the session was engaged and work in collaboration giving feedback about her triggers and symptoms over the past few weeks. The patient spoke about working to implement coping going on [employer], socializing, and community outings. The patient spoke about the ongoing impact of trauma from losing her son. The patient spoke about her stress from finding out she has kidney problem and pain from planters faucitis. The patient noted" Sunday I was in so much pain from my foot I tried to go to Urgent Care but they were closed and I decided not to go to the ER , and I held out to be seen at urgent care Monday". The patient was given a shot for pain management and referred to Ortho. The OPT therapist utilized Cognitive Behavioral Therapy through cognitive restructuring as well as worked with the patient on coping strategies to assist in management of mood.The patient spoke about her recent Easter  holiday and noted the plan was to go to [place] to spend time with her daughter,  but due to being in so much Pain on Sunday she cancelled. The patient will be adding psychiatry and med management as part of her mental health treatment moving forward.   Suicidal/Homicidal: Nowithout intent/plan   Therapist Response: The OPT therapist worked with the patient for the patients scheduled session. The patient was engaged in her session and gave feedback in relation to triggers, symptoms, and behavior responses over the past few weeks. The OPT therapist worked with the patient utilizing an in session Cognitive Behavioral Therapy exercise. The patient was responsive in the session and verbalized," I was in so much pain with my foot I could not put any weight on it but what ever injection they gave me at urgent care it worked great and by that night there was no pain and they told me I have planters fascitis and referred me to Orthopedics". The patient spoke about wanting to make positive changes in her life to be active and more social. The OPT therapist worked with the patient on placing her energy/focus/and attention in areas of her life she has control over and doing her self check ins throughout the week. The patient will be adding medication therapy as a treatment  from a psychiatrist for her [place] . The OPT therapist will continue treatment work with the patient in her next scheduled session   Plan: Return again in 3 weeks.   Diagnosis       Axis I:  Bipolar 1 disorder, depressed, moderate / PTSD /GAD  Axis II: No diagnosis     Collaboration of Care: No additional collaboration of care for this session.   Patient/Guardian was advised Release of Information must be obtained prior to any record release in order to collaborate their care with an outside provider. Patient/Guardian was advised if they have not already done so to contact the registration department to sign all necessary forms in order for [place] to release information regarding their care.    Consent:  Patient/Guardian gives verbal consent for treatment and assignment of benefits for services provided during this visit. Patient/Guardian expressed understanding and agreed to proceed.    I discussed the assessment and treatment plan with the patient. The patient was provided an opportunity to ask questions and all were answered. The patient agreed with the plan and demonstrated an understanding of the instructions.   The patient was advised to call back or seek an in-person evaluation if the symptoms worsen or if the condition fails to improve as anticipated.   I provided 45 minutes of face-to-face time during this encounter.   Lennox Grumbles, LCSW    01/07/2023

## 2023-01-09 DIAGNOSIS — M722 Plantar fascial fibromatosis: Secondary | ICD-10-CM | POA: Diagnosis not present

## 2023-01-09 DIAGNOSIS — M79671 Pain in right foot: Secondary | ICD-10-CM | POA: Diagnosis not present

## 2023-01-27 ENCOUNTER — Ambulatory Visit (HOSPITAL_COMMUNITY): Payer: Medicaid Other | Admitting: Psychiatry

## 2023-01-28 ENCOUNTER — Encounter (HOSPITAL_COMMUNITY): Payer: Self-pay | Admitting: Psychiatry

## 2023-01-28 ENCOUNTER — Ambulatory Visit (INDEPENDENT_AMBULATORY_CARE_PROVIDER_SITE_OTHER): Payer: Medicaid Other | Admitting: Psychiatry

## 2023-01-28 DIAGNOSIS — F321 Major depressive disorder, single episode, moderate: Secondary | ICD-10-CM

## 2023-01-28 DIAGNOSIS — F411 Generalized anxiety disorder: Secondary | ICD-10-CM

## 2023-01-28 DIAGNOSIS — F19982 Other psychoactive substance use, unspecified with psychoactive substance-induced sleep disorder: Secondary | ICD-10-CM

## 2023-01-28 DIAGNOSIS — F4310 Post-traumatic stress disorder, unspecified: Secondary | ICD-10-CM | POA: Diagnosis not present

## 2023-01-28 DIAGNOSIS — F1290 Cannabis use, unspecified, uncomplicated: Secondary | ICD-10-CM | POA: Diagnosis not present

## 2023-01-28 DIAGNOSIS — M797 Fibromyalgia: Secondary | ICD-10-CM

## 2023-01-28 DIAGNOSIS — Z6981 Encounter for mental health services for victim of other abuse: Secondary | ICD-10-CM

## 2023-01-28 DIAGNOSIS — F332 Major depressive disorder, recurrent severe without psychotic features: Secondary | ICD-10-CM

## 2023-01-28 DIAGNOSIS — M159 Polyosteoarthritis, unspecified: Secondary | ICD-10-CM

## 2023-01-28 DIAGNOSIS — F4001 Agoraphobia with panic disorder: Secondary | ICD-10-CM

## 2023-01-28 DIAGNOSIS — Z720 Tobacco use: Secondary | ICD-10-CM

## 2023-01-28 MED ORDER — ESCITALOPRAM OXALATE 10 MG PO TABS: ORAL_TABLET | ORAL | 1 refills | Status: DC

## 2023-01-28 MED ORDER — LAMOTRIGINE 25 MG PO TABS: ORAL_TABLET | ORAL | 1 refills | Status: DC
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
## 2023-01-28 NOTE — Patient Instructions (Addendum)
We added lamotrigine 25 mg once nightly for 2 weeks today.  After being on that dose for 2 weeks then increase to 50 mg nightly for 2 weeks.  Do best to try and cut back and remain abstinent from marijuana as a school worsened your sleep, mood significantly.  Similarly begin cutting back by about half a glass of tea per day every 5 days to avoid the caffeine and make withdrawal.  Consider tobacco cessation and if you are interested to go to this website and you should be able to get resources for free: [email or web address]

## 2023-01-30 ENCOUNTER — Encounter: Payer: Self-pay | Admitting: Internal Medicine

## 2023-01-30 ENCOUNTER — Ambulatory Visit: Payer: Medicaid Other | Attending: Internal Medicine | Admitting: Internal Medicine

## 2023-01-30 VITALS — BP 170/93 | HR 97 | Resp 16 | Ht 63.0 in | Wt 183.0 lb

## 2023-01-30 DIAGNOSIS — M159 Polyosteoarthritis, unspecified: Secondary | ICD-10-CM | POA: Diagnosis not present

## 2023-01-30 DIAGNOSIS — M542 Cervicalgia: Secondary | ICD-10-CM

## 2023-01-30 MED ORDER — PREDNISONE 20 MG PO TABS: ORAL_TABLET | ORAL | 0 refills | Status: AC
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
## 2023-02-05 ENCOUNTER — Encounter: Payer: Self-pay | Admitting: Family Medicine

## 2023-02-05 ENCOUNTER — Ambulatory Visit: Payer: Medicaid Other | Admitting: Family Medicine

## 2023-02-05 VITALS — BP 138/86 | HR 82 | Ht 63.0 in | Wt 181.0 lb

## 2023-02-05 DIAGNOSIS — L989 Disorder of the skin and subcutaneous tissue, unspecified: Secondary | ICD-10-CM | POA: Diagnosis not present

## 2023-02-05 DIAGNOSIS — N1831 Chronic kidney disease, stage 3a: Secondary | ICD-10-CM

## 2023-02-05 DIAGNOSIS — G542 Cervical root disorders, not elsewhere classified: Secondary | ICD-10-CM

## 2023-02-05 DIAGNOSIS — E785 Hyperlipidemia, unspecified: Secondary | ICD-10-CM

## 2023-02-05 NOTE — Progress Notes (Signed)
   Subjective:    Patient ID: Daisy [name], female    DOB: 1961-03-26, 62 y.o.   MRN: [id number]  HPI Patient arrives for 2 month follow up. Patient states she is having issues with her hands and neck today. Patient has significant cervical spine pain and discomfort this been going on for months please see previous notations Please radiate into both shoulders and down the arm halfway on the left side she describes as aching burning pain and discomfort very difficult to get her neck in a comfortable position she had a cervical spine MRI several years ago she wants to see neurosurgery for further input and opinion she states the pain is pretty difficult to put up with It is hard to know if she has a surgical problem more so just extensive osteoarthritis She denies any injury She is seeing psychiatrist and states the medicine is helping    Review of Systems     Objective:   Physical Exam  General-in no acute distress Eyes-no discharge Lungs-respiratory rate normal, CTA CV-no murmurs,RRR Extremities skin warm dry no edema Neuro grossly normal Behavior normal, alert Extensive osteoarthritis noted in her hands x-rays were reviewed also has decreased range of motion in the neck to the left right up and down at least by 50% in addition to this she has perceived weakness in the left arm and she is left arm dominant I do not find excessive evidence of weakness reflexes are good       Assessment & Plan:  1. Cervical nerve root impingement She had significant decreased range of motion anti-inflammatories have not helped her She has underlying chronic kidney disease so therefore cannot use ongoing anti-inflammatories Possibly Cymbalta will help but her psychiatrist have her on some meds We will reach out to specialist after the MRI To see if they would recommend any other medicines Patient has failed conservative management over the past 3 months progressive symptoms despite  anti-inflammatories gentle range of motion exercises pain is becoming worse radiating into both shoulders and halfway down the left arm keeps her awake at night - MR Cervical Spine Wo Contrast; Future  2. Stage 3a chronic kidney disease (HCC) Check lab work.  Healthy diet recommended.  Low likelihood of dialysis  - Basic Metabolic Panel (7)  3. Skin lesion She has a skin lesion on her leg and on her forehead I recommend dermatology consult  4. Hyperlipidemia, unspecified hyperlipidemia type Check labs healthy diet recommended - Lipid panel

## 2023-02-06 ENCOUNTER — Other Ambulatory Visit: Payer: Self-pay | Admitting: Family Medicine

## 2023-02-06 ENCOUNTER — Other Ambulatory Visit: Payer: Self-pay

## 2023-02-06 DIAGNOSIS — G542 Cervical root disorders, not elsewhere classified: Secondary | ICD-10-CM

## 2023-02-06 MED ORDER — HYDROCODONE-ACETAMINOPHEN 5-325 MG PO TABS: ORAL_TABLET | ORAL | 0 refills | Status: DC

## 2023-02-08 ENCOUNTER — Telehealth: Payer: Self-pay | Admitting: Family Medicine

## 2023-02-08 NOTE — Telephone Encounter (Signed)
Nurses   Her psychiatrist is requesting some lab work on her Diagnosis Weight loss, binge eating, fatigue, vitamin D deficiency, vitamin B12 deficiency, hyperlipidemia Also recommending nutritional consult  May order the following tests vitamin D, B12, folate, TIBC, phosphorus, magnesium, CBC, CMP, lipid  May cancel metabolic 7 and lipid panel ordered on May 2 Please connect with patient to let her know the additional labs have been ordered (We will hold off on doing EKG currently)  Daisy Lincoln, MD  Babs Sciara, MD Dr. Gerda Diss,  Thank you for taking care of [facility].  It sounds like she is only been eating 1 meal per day and has binge episodes about once per week.  Could you please obtain vitamin D, B12, folate, iron panel, phosphorus, magnesium, CBC, CMP, EKG and nutrition referral?  Thanks, Sam

## 2023-02-11 ENCOUNTER — Ambulatory Visit (HOSPITAL_COMMUNITY): Payer: Medicaid Other | Admitting: Clinical

## 2023-02-17 NOTE — Addendum Note (Signed)
Addended by: Margaretha Sheffield on: 02/17/2023 11:43 AM   Modules accepted: Orders

## 2023-02-17 NOTE — Telephone Encounter (Signed)
Left message for patient to return call for details.  

## 2023-02-19 ENCOUNTER — Other Ambulatory Visit: Payer: Self-pay | Admitting: Family Medicine

## 2023-02-25 ENCOUNTER — Other Ambulatory Visit: Payer: Self-pay | Admitting: Family Medicine

## 2023-02-26 NOTE — Telephone Encounter (Signed)
We will address this at her follow-up visit

## 2023-02-27 ENCOUNTER — Telehealth (INDEPENDENT_AMBULATORY_CARE_PROVIDER_SITE_OTHER): Payer: Medicaid Other | Admitting: Psychiatry

## 2023-02-27 ENCOUNTER — Encounter (HOSPITAL_COMMUNITY): Payer: Self-pay | Admitting: Psychiatry

## 2023-02-27 DIAGNOSIS — F332 Major depressive disorder, recurrent severe without psychotic features: Secondary | ICD-10-CM

## 2023-02-27 DIAGNOSIS — F17201 Nicotine dependence, unspecified, in remission: Secondary | ICD-10-CM

## 2023-02-27 DIAGNOSIS — F4310 Post-traumatic stress disorder, unspecified: Secondary | ICD-10-CM

## 2023-02-27 DIAGNOSIS — F411 Generalized anxiety disorder: Secondary | ICD-10-CM

## 2023-02-27 DIAGNOSIS — M797 Fibromyalgia: Secondary | ICD-10-CM

## 2023-02-27 DIAGNOSIS — F1290 Cannabis use, unspecified, uncomplicated: Secondary | ICD-10-CM

## 2023-02-27 DIAGNOSIS — F19982 Other psychoactive substance use, unspecified with psychoactive substance-induced sleep disorder: Secondary | ICD-10-CM

## 2023-02-27 DIAGNOSIS — F321 Major depressive disorder, single episode, moderate: Secondary | ICD-10-CM

## 2023-02-27 MED ORDER — ESCITALOPRAM OXALATE 10 MG PO TABS: ORAL_TABLET | ORAL | 2 refills | Status: DC

## 2023-02-27 MED ORDER — LAMOTRIGINE 100 MG PO TABS: 100.0000 mg | ORAL_TABLET | Freq: Every day | ORAL | 1 refills | Status: DC

## 2023-02-27 NOTE — Telephone Encounter (Signed)
We will touch base with patient when she comes in

## 2023-02-27 NOTE — Patient Instructions (Signed)
We increased your Lamictal (lamotrigine) to 75 mg nightly today.  Take this for the next 2 weeks and then you are 100 mg dose will be available at the pharmacy to take thereafter nightly.  Keep up the good work with staying off of cigarettes and continuing to cut back on the marijuana.  Similarly good job with cutting back on caffeine this should continue to make sleep a bit easier and help reduce your anxiety.  I will coordinate with your PCP to get the blood work we talked about as well as a nutrition referral.

## 2023-02-27 NOTE — Telephone Encounter (Signed)
Have been unable to reach patient by phone. Patient has office visit scheduled with Dr Lorin Picket next week 03/05/23
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
## 2023-03-05 ENCOUNTER — Ambulatory Visit: Payer: Medicaid Other | Admitting: Family Medicine

## 2023-03-05 VITALS — BP 138/88 | HR 108 | Ht 63.0 in | Wt 182.0 lb

## 2023-03-05 DIAGNOSIS — D649 Anemia, unspecified: Secondary | ICD-10-CM | POA: Diagnosis not present

## 2023-03-05 DIAGNOSIS — E876 Hypokalemia: Secondary | ICD-10-CM

## 2023-03-05 DIAGNOSIS — E538 Deficiency of other specified B group vitamins: Secondary | ICD-10-CM | POA: Diagnosis not present

## 2023-03-05 DIAGNOSIS — D692 Other nonthrombocytopenic purpura: Secondary | ICD-10-CM

## 2023-03-05 DIAGNOSIS — E559 Vitamin D deficiency, unspecified: Secondary | ICD-10-CM | POA: Diagnosis not present

## 2023-03-05 DIAGNOSIS — M159 Polyosteoarthritis, unspecified: Secondary | ICD-10-CM

## 2023-03-05 DIAGNOSIS — G542 Cervical root disorders, not elsewhere classified: Secondary | ICD-10-CM

## 2023-03-05 DIAGNOSIS — N1831 Chronic kidney disease, stage 3a: Secondary | ICD-10-CM | POA: Diagnosis not present

## 2023-03-05 DIAGNOSIS — Z79899 Other long term (current) drug therapy: Secondary | ICD-10-CM | POA: Diagnosis not present

## 2023-03-05 DIAGNOSIS — E663 Overweight: Secondary | ICD-10-CM

## 2023-03-05 MED ORDER — OXYCODONE-ACETAMINOPHEN 5-325 MG PO TABS: 1.0000 | ORAL_TABLET | Freq: Four times a day (QID) | ORAL | 0 refills | Status: DC | PRN

## 2023-03-05 NOTE — Progress Notes (Signed)
Subjective:    Patient ID: Daisy [name], female    DOB: 1961/10/05, 62 y.o.   MRN: [id number]  HPI Patient arrives today for 4 week follow up.  Cervical nerve root impingement  Stage 3a chronic kidney disease (HCC) - Plan: CBC with Differential, Basic Metabolic Panel (7), Hepatic function panel  Hypokalemia - Plan: Phosphorus, Magnesium  Anemia, unspecified type - Plan: Folate, Iron and TIBC([facility]/Sunquest), Phosphorus  High risk medication use - Plan: EKG 12-Lead  B12 deficiency - Plan: Vitamin B12  Senile purpura (HCC)  Overweight - Plan: Phosphorus, CBC with Differential, Basic Metabolic Panel (7), Hepatic function panel, Amb ref to Medical Nutrition Therapy-MNT, EKG 12-Lead  Vitamin D deficiency - Plan: Vitamin D, 25-hydroxy  Generalized osteoarthritis of multiple sites  Outpatient Encounter Medications as of 03/05/2023  Medication Sig   escitalopram (LEXAPRO) 10 MG tablet Take 1 tablet by mouth once daily   [START ON 03/13/2023] lamoTRIgine (LAMICTAL) 100 MG tablet Take 1 tablet (100 mg total) by mouth daily. After completing 75 mg nightly course for 2 weeks.   lamoTRIgine (LAMICTAL) 25 MG tablet Take 1 tablet by mouth nightly for 2 weeks.  Then increase to 2 tablets nightly for 2 weeks.   oxyCODONE-acetaminophen (PERCOCET/ROXICET) 5-325 MG tablet Take 1 tablet by mouth every 6 (six) hours as needed for up to 5 days.   No facility-administered encounter medications on file as of 03/05/2023.    She is here today for follow-up She saw her psychiatrist who is concerned about the possibility of her having multiple underlying issues and is recommending a significant amount of lab work as well as EKG  Patient also has osteoarthritis Tylenol does not help enough NSAIDs did not help hydrocodone did not help she has taken oxycodone in the past that did help but she understands she cannot use it frequently she relates that she has a lot of severe pain discomfort in her joints.   Denies abusing medicines in the past.   Review of Systems     Objective:   Physical Exam General-in no acute distress Eyes-no discharge Lungs-respiratory rate normal, CTA CV-no murmurs,RRR Extremities skin warm dry no edema Neuro grossly normal Behavior normal, alert Osteoarthritis noted in the hands       Assessment & Plan:  1. Cervical nerve root impingement She has a lot of cervical neck pains and discomfort.  Pains in the back of the neck radiates around to the side of her head.  Mainly in the lower occipital cervical region.  She has an MRI coming up we await the results  2. Stage 3a chronic kidney disease (HCC) Check lab work await results healthy diet keep blood pressure under good control - CBC with Differential - Basic Metabolic Panel (7) - Hepatic function panel  3. Hypokalemia Check lab work await results - Phosphorus - Magnesium  4. Anemia, unspecified type Check lab work await results - Folate - Iron and TIBC([facility]/Sunquest) - Phosphorus  5. High risk medication use EKG overall had normal appearance will forward this to psychiatry - EKG 12-Lead  6. B12 deficiency Check B12 level - Vitamin B12  7. Senile purpura (HCC) Noted on the arms stable.  Await what platelet count shows  8. Overweight Portion control regular physical activity try to bring weight down nutritional consult - Phosphorus - CBC with Differential - Basic Metabolic Panel (7) - Hepatic function panel - Amb ref to Medical Nutrition Therapy-MNT - EKG 12-Lead  9. Vitamin D deficiency Labs ordered - Vitamin  D, 25-hydroxy  10. Generalized osteoarthritis of multiple sites Short supply of oxycodone given she was instructed to use it sparingly not for frequent use she was also instructed that this gets to the point where she feels like she needs ongoing care for this then we will need to set her up for pain management consultation

## 2023-03-06 DIAGNOSIS — N1831 Chronic kidney disease, stage 3a: Secondary | ICD-10-CM | POA: Diagnosis not present

## 2023-03-06 DIAGNOSIS — E876 Hypokalemia: Secondary | ICD-10-CM | POA: Diagnosis not present

## 2023-03-06 DIAGNOSIS — E538 Deficiency of other specified B group vitamins: Secondary | ICD-10-CM | POA: Diagnosis not present

## 2023-03-06 DIAGNOSIS — E559 Vitamin D deficiency, unspecified: Secondary | ICD-10-CM | POA: Diagnosis not present

## 2023-03-06 DIAGNOSIS — D649 Anemia, unspecified: Secondary | ICD-10-CM | POA: Diagnosis not present

## 2023-03-06 DIAGNOSIS — E663 Overweight: Secondary | ICD-10-CM | POA: Diagnosis not present

## 2023-03-07 LAB — CBC WITH DIFFERENTIAL/PLATELET
Basophils Absolute: 0.1 10*3/uL (ref 0.0–0.2)
Basos: 1 %
EOS (ABSOLUTE): 0.2 10*3/uL (ref 0.0–0.4)
Eos: 3 %
Hematocrit: 43.1 % (ref 34.0–46.6)
Hemoglobin: 13.7 g/dL (ref 11.1–15.9)
Immature Grans (Abs): 0.1 10*3/uL (ref 0.0–0.1)
Immature Granulocytes: 1 %
Lymphocytes Absolute: 2.7 10*3/uL (ref 0.7–3.1)
Lymphs: 30 %
MCH: 27.4 pg (ref 26.6–33.0)
MCHC: 31.8 g/dL (ref 31.5–35.7)
MCV: 86 fL (ref 79–97)
Monocytes Absolute: 0.6 10*3/uL (ref 0.1–0.9)
Monocytes: 6 %
Neutrophils Absolute: 5.4 10*3/uL (ref 1.4–7.0)
Neutrophils: 59 %
Platelets: 255 10*3/uL (ref 150–450)
RBC: 5 x10E6/uL (ref 3.77–5.28)
RDW: 14.6 % (ref 11.7–15.4)
WBC: 9.1 10*3/uL (ref 3.4–10.8)

## 2023-03-07 LAB — IRON AND TIBC
Iron Saturation: 12 % — ABNORMAL LOW (ref 15–55)
Iron: 43 ug/dL (ref 27–139)
Total Iron Binding Capacity: 355 ug/dL (ref 250–450)
UIBC: 312 ug/dL (ref 118–369)

## 2023-03-07 LAB — BASIC METABOLIC PANEL (7)
BUN/Creatinine Ratio: 18 (ref 12–28)
BUN: 23 mg/dL (ref 8–27)
CO2: 22 mmol/L (ref 20–29)
Chloride: 103 mmol/L (ref 96–106)
Creatinine, Ser: 1.26 mg/dL — ABNORMAL HIGH (ref 0.57–1.00)
Glucose: 129 mg/dL — ABNORMAL HIGH (ref 70–99)
Potassium: 5.1 mmol/L (ref 3.5–5.2)
Sodium: 139 mmol/L (ref 134–144)
eGFR: 48 mL/min/{1.73_m2} — ABNORMAL LOW (ref >59)

## 2023-03-07 LAB — HEPATIC FUNCTION PANEL
ALT: 14 IU/L (ref 0–32)
AST: 16 IU/L (ref 0–40)
Albumin: 4.1 g/dL (ref 3.9–4.9)
Alkaline Phosphatase: 82 IU/L (ref 44–121)
Bilirubin Total: <0.2 mg/dL (ref 0.0–1.2)
Bilirubin, Direct: <0.1 mg/dL (ref 0.00–0.40)
Total Protein: 7 g/dL (ref 6.0–8.5)

## 2023-03-07 LAB — MAGNESIUM: Magnesium: 1.9 mg/dL (ref 1.6–2.3)

## 2023-03-07 LAB — FOLATE: Folate: 4 ng/mL (ref >3.0)

## 2023-03-07 LAB — VITAMIN D 25 HYDROXY (VIT D DEFICIENCY, FRACTURES): Vit D, 25-Hydroxy: 25.1 ng/mL — ABNORMAL LOW (ref 30.0–100.0)

## 2023-03-07 LAB — PHOSPHORUS: Phosphorus: 4.3 mg/dL (ref 3.0–4.3)

## 2023-03-07 LAB — VITAMIN B12: Vitamin B-12: 768 pg/mL (ref 232–1245)

## 2023-03-10 ENCOUNTER — Encounter: Payer: Self-pay | Admitting: *Deleted

## 2023-03-11 NOTE — Telephone Encounter (Signed)
Go ahead with vitamin D 50,000 units once per week, #4, 2 refills after that 2000 units OTC vitamin D daily  I would recommend referral to Dr. Marletta Lor or Dr. Levon Hedger both of them would do a good job

## 2023-03-12 ENCOUNTER — Other Ambulatory Visit: Payer: Self-pay

## 2023-03-12 DIAGNOSIS — Z1211 Encounter for screening for malignant neoplasm of colon: Secondary | ICD-10-CM

## 2023-03-12 DIAGNOSIS — E559 Vitamin D deficiency, unspecified: Secondary | ICD-10-CM

## 2023-03-12 MED ORDER — VITAMIN D (ERGOCALCIFEROL) 1.25 MG (50000 UNIT) PO CAPS: 50000.0000 [IU] | ORAL_CAPSULE | ORAL | 2 refills | Status: DC

## 2023-03-15 ENCOUNTER — Ambulatory Visit
Admission: RE | Admit: 2023-03-15 | Discharge: 2023-03-15 | Disposition: A | Discharge status: Home / Self Care | Payer: Medicaid Other | Source: Ambulatory Visit | Attending: Family Medicine | Admitting: Family Medicine

## 2023-03-15 DIAGNOSIS — M47812 Spondylosis without myelopathy or radiculopathy, cervical region: Secondary | ICD-10-CM | POA: Diagnosis not present

## 2023-03-15 DIAGNOSIS — M50323 Other cervical disc degeneration at C6-C7 level: Secondary | ICD-10-CM | POA: Diagnosis not present

## 2023-03-15 DIAGNOSIS — G542 Cervical root disorders, not elsewhere classified: Secondary | ICD-10-CM

## 2023-03-17 ENCOUNTER — Encounter: Payer: Self-pay | Admitting: *Deleted

## 2023-03-19 ENCOUNTER — Ambulatory Visit (HOSPITAL_COMMUNITY): Payer: Medicaid Other | Admitting: Clinical

## 2023-03-23 ENCOUNTER — Other Ambulatory Visit: Payer: Self-pay | Admitting: Family Medicine

## 2023-03-23 ENCOUNTER — Other Ambulatory Visit: Payer: Self-pay | Admitting: Urology

## 2023-03-23 ENCOUNTER — Encounter: Payer: Self-pay | Admitting: Family Medicine

## 2023-03-23 DIAGNOSIS — G542 Cervical root disorders, not elsewhere classified: Secondary | ICD-10-CM

## 2023-03-23 DIAGNOSIS — N3281 Overactive bladder: Secondary | ICD-10-CM

## 2023-03-23 MED ORDER — OXYCODONE-ACETAMINOPHEN 5-325 MG PO TABS: 1.0000 | ORAL_TABLET | Freq: Four times a day (QID) | ORAL | 0 refills | Status: AC | PRN

## 2023-03-23 NOTE — Telephone Encounter (Signed)
Nurses Prescription for oxycodone was sent into [facility] Please go ahead with pain management for all [facility] for [facility] in [place] thank you

## 2023-03-31 DIAGNOSIS — Z79899 Other long term (current) drug therapy: Secondary | ICD-10-CM | POA: Diagnosis not present

## 2023-03-31 DIAGNOSIS — E559 Vitamin D deficiency, unspecified: Secondary | ICD-10-CM | POA: Diagnosis not present

## 2023-03-31 DIAGNOSIS — G542 Cervical root disorders, not elsewhere classified: Secondary | ICD-10-CM | POA: Diagnosis not present

## 2023-03-31 DIAGNOSIS — M129 Arthropathy, unspecified: Secondary | ICD-10-CM | POA: Diagnosis not present

## 2023-03-31 DIAGNOSIS — G894 Chronic pain syndrome: Secondary | ICD-10-CM | POA: Diagnosis not present

## 2023-03-31 DIAGNOSIS — R030 Elevated blood-pressure reading, without diagnosis of hypertension: Secondary | ICD-10-CM | POA: Diagnosis not present

## 2023-03-31 DIAGNOSIS — Z6828 Body mass index (BMI) 28.0-28.9, adult: Secondary | ICD-10-CM | POA: Diagnosis not present

## 2023-03-31 LAB — LAB REPORT - SCANNED: EGFR: 50

## 2023-04-02 ENCOUNTER — Other Ambulatory Visit: Payer: Self-pay

## 2023-04-02 DIAGNOSIS — G542 Cervical root disorders, not elsewhere classified: Secondary | ICD-10-CM

## 2023-04-07 ENCOUNTER — Encounter: Payer: Self-pay | Admitting: *Deleted

## 2023-04-13 ENCOUNTER — Telehealth: Payer: Self-pay

## 2023-04-13 NOTE — Telephone Encounter (Signed)
Chart review completed for patient. Patient is due for screening mammogram. Mychart message sent to patient to inquire about scheduling mammogram.  Cailan Antonucci, Population Health Specialist.  

## 2023-04-16 DIAGNOSIS — Z79899 Other long term (current) drug therapy: Secondary | ICD-10-CM | POA: Diagnosis not present

## 2023-04-16 DIAGNOSIS — G894 Chronic pain syndrome: Secondary | ICD-10-CM | POA: Diagnosis not present

## 2023-04-16 DIAGNOSIS — G542 Cervical root disorders, not elsewhere classified: Secondary | ICD-10-CM | POA: Diagnosis not present

## 2023-04-16 DIAGNOSIS — Z6828 Body mass index (BMI) 28.0-28.9, adult: Secondary | ICD-10-CM | POA: Diagnosis not present

## 2023-04-16 DIAGNOSIS — R030 Elevated blood-pressure reading, without diagnosis of hypertension: Secondary | ICD-10-CM | POA: Diagnosis not present

## 2023-04-21 DIAGNOSIS — M47892 Other spondylosis, cervical region: Secondary | ICD-10-CM | POA: Diagnosis not present

## 2023-04-21 DIAGNOSIS — M4722 Other spondylosis with radiculopathy, cervical region: Secondary | ICD-10-CM | POA: Diagnosis not present

## 2023-04-21 DIAGNOSIS — Z6832 Body mass index (BMI) 32.0-32.9, adult: Secondary | ICD-10-CM | POA: Diagnosis not present

## 2023-04-24 ENCOUNTER — Ambulatory Visit: Payer: Medicaid Other | Admitting: Urology

## 2023-05-04 ENCOUNTER — Other Ambulatory Visit: Payer: Self-pay | Admitting: Neurosurgery

## 2023-05-07 ENCOUNTER — Ambulatory Visit: Payer: Medicaid Other | Admitting: Nutrition

## 2023-05-11 ENCOUNTER — Other Ambulatory Visit (HOSPITAL_COMMUNITY): Payer: Self-pay | Admitting: Psychiatry

## 2023-05-11 NOTE — Pre-Procedure Instructions (Signed)
Surgical Instructions    Your procedure is scheduled on May 15, 2023.  Report to [facility] Main Entrance "A" at 8:25 A.M., then check in with the Admitting office.  Call this number if you have problems the morning of surgery:  [phone]   If you have any questions prior to your surgery date call [phone]: Open Monday-Friday 8am-4pm If you experience any cold or flu symptoms such as cough, fever, chills, shortness of breath, etc. between now and your scheduled surgery, please notify us at the above number     Remember:  Do not eat or drink after midnight the night before your surgery   Take these medicines the morning of surgery with A SIP OF WATER: oxyCODONE-acetaminophen (PERCOCET/ROXICET)   methocarbamol (ROBAXIN) if needed  As of today, STOP taking any Aspirin (unless otherwise instructed by your surgeon) Aleve, Naproxen, Ibuprofen, Motrin, Advil, Goody's, BC's, all herbal medications, fish oil, and all vitamins.           [facility] is not responsible for any belongings or valuables.    Do NOT Smoke (Tobacco/Vaping)  24 hours prior to your procedure  If you use a CPAP at night, you may bring your mask for your overnight stay.   Contacts, glasses, hearing aids, dentures or partials may not be worn into surgery, please bring cases for these belongings   For patients admitted to the hospital, discharge time will be determined by your treatment team.   Patients discharged the day of surgery will not be allowed to drive home, and someone needs to stay with them for 24 hours.   SURGICAL WAITING ROOM VISITATION Patients having surgery or a procedure may have no more than 2 support people in the waiting area - these visitors may rotate.   Children under the age of 11 must have an adult with them who is not the patient. If the patient needs to stay at the hospital during part of their recovery, the visitor guidelines for inpatient rooms apply. Pre-op nurse will  coordinate an appropriate time for 1 support person to accompany patient in pre-op.  This support person may not rotate.   Please refer to [email or web address] for the visitor guidelines for Inpatients (after your surgery is over and you are in a regular room).    Special instructions:    Oral Hygiene is also important to reduce your risk of infection.  Remember - BRUSH YOUR TEETH THE MORNING OF SURGERY WITH YOUR REGULAR TOOTHPASTE     Pre-operative 5 CHG Bath Instructions   You can play a key role in reducing the risk of infection after surgery. Your skin needs to be as free of germs as possible. You can reduce the number of germs on your skin by washing with CHG (chlorhexidine gluconate) soap before surgery. CHG is an antiseptic soap that kills germs and continues to kill germs even after washing.   DO NOT use if you have an allergy to chlorhexidine/CHG or antibacterial soaps. If your skin becomes reddened or irritated, stop using the CHG and notify one of our RNs at [phone].   Please shower with the CHG soap starting 4 days before surgery using the following schedule:     Please keep in mind the following:  DO NOT shave, including legs and underarms, starting the day of your first shower.   You may shave your face at any point before/day of surgery.  Place clean sheets on your bed the day you start using CHG soap. Use  a clean washcloth (not used since being washed) for each shower. DO NOT sleep with pets once you start using the CHG.   CHG Shower Instructions:  If you choose to wash your hair and private area, wash first with your normal shampoo/soap.  After you use shampoo/soap, rinse your hair and body thoroughly to remove shampoo/soap residue.  Turn the water OFF and apply about 3 tablespoons (45 ml) of CHG soap to a CLEAN washcloth.  Apply CHG soap ONLY FROM YOUR NECK DOWN TO YOUR TOES (washing for 3-5 minutes)  DO NOT use  CHG soap on face, private areas, open wounds, or sores.  Pay special attention to the area where your surgery is being performed.  If you are having back surgery, having someone wash your back for you may be helpful. Wait 2 minutes after CHG soap is applied, then you may rinse off the CHG soap.  Pat dry with a clean towel  Put on clean clothes/pajamas   If you choose to wear lotion, please use ONLY the CHG-compatible lotions on the back of this paper.     Additional instructions for the day of surgery: DO NOT APPLY any lotions, deodorants, cologne, or perfumes.   Put on clean/comfortable clothes.  Brush your teeth.  Ask your nurse before applying any prescription medications to the skin.  Do not wear jewelry or makeup.  Men may shave face Do not bring valuables to the hospital. Do not wear nail polish, gel polish, artificial nails, or any other type of covering on natural nails (fingers and toes) If you have artificial nails or gel coating that need to be removed by a nail salon, please have this removed prior to surgery. Artificial nails or gel coating may interfere with anesthesia's ability to adequately monitor your vital signs.     CHG Compatible Lotions   Aveeno Moisturizing lotion  Cetaphil Moisturizing Cream  Cetaphil Moisturizing Lotion  Clairol Herbal Essence Moisturizing Lotion, Dry Skin  Clairol Herbal Essence Moisturizing Lotion, Extra Dry Skin  Clairol Herbal Essence Moisturizing Lotion, Normal Skin  Curel Age Defying Therapeutic Moisturizing Lotion with Alpha Hydroxy  Curel Extreme Care Body Lotion  Curel Soothing Hands Moisturizing Hand Lotion  Curel Therapeutic Moisturizing Cream, Fragrance-Free  Curel Therapeutic Moisturizing Lotion, Fragrance-Free  Curel Therapeutic Moisturizing Lotion, Original Formula  Eucerin Daily Replenishing Lotion  Eucerin Dry Skin Therapy Plus Alpha Hydroxy Crme  Eucerin Dry Skin Therapy Plus Alpha Hydroxy Lotion  Eucerin Original  Crme  Eucerin Original Lotion  Eucerin Plus Crme Eucerin Plus Lotion  Eucerin TriLipid Replenishing Lotion  Keri Anti-Bacterial Hand Lotion  Keri Deep Conditioning Original Lotion Dry Skin Formula Softly Scented  Keri Deep Conditioning Original Lotion, Fragrance Free Sensitive Skin Formula  Keri Lotion Fast Absorbing Fragrance Free Sensitive Skin Formula  Keri Lotion Fast Absorbing Softly Scented Dry Skin Formula  Keri Original Lotion  Keri Skin Renewal Lotion Keri Silky Smooth Lotion  Keri Silky Smooth Sensitive Skin Lotion  Nivea Body Creamy Conditioning Oil  Nivea Body Extra Enriched Lotion  Nivea Body Original Lotion  Nivea Body Sheer Moisturizing Lotion Nivea Crme  Nivea Skin Firming Lotion  NutraDerm 30 Skin Lotion  NutraDerm Skin Lotion  NutraDerm Therapeutic Skin Cream  NutraDerm Therapeutic Skin Lotion  ProShield Protective Hand Cream  Provon moisturizing lotion     If you received a COVID test during your pre-op visit, it is requested that you wear a mask when out in public, stay away from anyone that may not be feeling  well, and notify your surgeon if you develop symptoms. If you have been in contact with anyone that has tested positive in the last 10 days, please notify your surgeon.    Please read over the following fact sheets that you were given.

## 2023-05-12 ENCOUNTER — Encounter (HOSPITAL_COMMUNITY): Payer: Self-pay

## 2023-05-12 ENCOUNTER — Other Ambulatory Visit: Payer: Self-pay

## 2023-05-12 ENCOUNTER — Encounter (HOSPITAL_COMMUNITY)
Admission: RE | Admit: 2023-05-12 | Discharge: 2023-05-12 | Disposition: A | Discharge status: Home / Self Care | Payer: Medicaid Other | Source: Ambulatory Visit | Attending: Neurosurgery | Admitting: Neurosurgery

## 2023-05-12 VITALS — BP 142/94 | HR 97 | Temp 98.2°F | Resp 18 | Ht 63.0 in | Wt 188.5 lb

## 2023-05-12 DIAGNOSIS — Z01812 Encounter for preprocedural laboratory examination: Secondary | ICD-10-CM | POA: Insufficient documentation

## 2023-05-12 DIAGNOSIS — Z01818 Encounter for other preprocedural examination: Secondary | ICD-10-CM

## 2023-05-12 LAB — BASIC METABOLIC PANEL
Anion gap: 9 (ref 5–15)
BUN: 26 mg/dL — ABNORMAL HIGH (ref 8–23)
CO2: 21 mmol/L — ABNORMAL LOW (ref 22–32)
Calcium: 8.9 mg/dL (ref 8.9–10.3)
Chloride: 105 mmol/L (ref 98–111)
Creatinine, Ser: 1.06 mg/dL — ABNORMAL HIGH (ref 0.44–1.00)
GFR, Estimated: 59 mL/min — ABNORMAL LOW (ref >60)
Glucose, Bld: 100 mg/dL — ABNORMAL HIGH (ref 70–99)
Potassium: 4.3 mmol/L (ref 3.5–5.1)
Sodium: 135 mmol/L (ref 135–145)

## 2023-05-12 LAB — CBC
HCT: 48.2 % — ABNORMAL HIGH (ref 36.0–46.0)
Hemoglobin: 15.5 g/dL — ABNORMAL HIGH (ref 12.0–15.0)
MCH: 28.3 pg (ref 26.0–34.0)
MCHC: 32.2 g/dL (ref 30.0–36.0)
MCV: 88 fL (ref 80.0–100.0)
Platelets: 239 10*3/uL (ref 150–400)
RBC: 5.48 MIL/uL — ABNORMAL HIGH (ref 3.87–5.11)
RDW: 14.1 % (ref 11.5–15.5)
WBC: 11 10*3/uL — ABNORMAL HIGH (ref 4.0–10.5)
nRBC: 0 % (ref 0.0–0.2)

## 2023-05-12 LAB — TYPE AND SCREEN
ABO/RH(D): A POS
Antibody Screen: NEGATIVE

## 2023-05-12 LAB — SURGICAL PCR SCREEN
MRSA, PCR: NEGATIVE
Staphylococcus aureus: POSITIVE — AB

## 2023-05-12 NOTE — Progress Notes (Addendum)
PCP - Dr. Gerda Diss Cardiologist - Denies  PPM/ICD - Denies  Chest x-ray - 09/13/22 EKG - 03/05/23 Stress Test - "Like when I was 40, long long time"  2002 ECHO - Denies Cardiac Cath - Yes Long time ago here no blockages per patient  Sleep Study - No  DM - Denies   Blood Thinner Instructions: N/A Aspirin Instructions:N/A  ERAS Protcol -No   COVID TEST- N/A   Anesthesia review: No  Patient denies shortness of breath, fever, cough and chest pain at PAT appointment   All instructions explained to the patient, with a verbal understanding of the material. Patient agrees to go over the instructions while at home for a better understanding. The opportunity to ask questions was provided.

## 2023-05-15 DIAGNOSIS — R030 Elevated blood-pressure reading, without diagnosis of hypertension: Secondary | ICD-10-CM | POA: Diagnosis not present

## 2023-05-15 DIAGNOSIS — Z6828 Body mass index (BMI) 28.0-28.9, adult: Secondary | ICD-10-CM | POA: Diagnosis not present

## 2023-05-15 DIAGNOSIS — Z79899 Other long term (current) drug therapy: Secondary | ICD-10-CM | POA: Diagnosis not present

## 2023-05-15 DIAGNOSIS — G894 Chronic pain syndrome: Secondary | ICD-10-CM | POA: Diagnosis not present

## 2023-05-15 DIAGNOSIS — G542 Cervical root disorders, not elsewhere classified: Secondary | ICD-10-CM | POA: Diagnosis not present

## 2023-05-20 NOTE — Progress Notes (Signed)
SDW call.  Patient updated with arrival date and time of 05/22/2023 at 1100.  Remain NPO.  Patient has all her paperwork from her previous PAT appointment and has no questions at this time.

## 2023-05-22 ENCOUNTER — Inpatient Hospital Stay (HOSPITAL_COMMUNITY): Payer: Medicaid Other

## 2023-05-22 ENCOUNTER — Inpatient Hospital Stay (HOSPITAL_COMMUNITY)
Admission: RE | Disposition: A | Discharge status: Home / Self Care | Payer: Self-pay | Source: Home / Self Care | Attending: Neurosurgery

## 2023-05-22 ENCOUNTER — Encounter (HOSPITAL_COMMUNITY): Payer: Self-pay | Admitting: Neurosurgery

## 2023-05-22 ENCOUNTER — Other Ambulatory Visit: Payer: Self-pay

## 2023-05-22 ENCOUNTER — Inpatient Hospital Stay (HOSPITAL_COMMUNITY)
Admission: RE | Admit: 2023-05-22 | Discharge: 2023-05-26 | DRG: 472 | Disposition: A | Discharge status: Home / Self Care | Payer: Medicaid Other | Attending: Neurosurgery | Admitting: Neurosurgery

## 2023-05-22 DIAGNOSIS — E669 Obesity, unspecified: Secondary | ICD-10-CM | POA: Diagnosis present

## 2023-05-22 DIAGNOSIS — N1831 Chronic kidney disease, stage 3a: Secondary | ICD-10-CM | POA: Diagnosis present

## 2023-05-22 DIAGNOSIS — Z9049 Acquired absence of other specified parts of digestive tract: Secondary | ICD-10-CM

## 2023-05-22 DIAGNOSIS — M810 Age-related osteoporosis without current pathological fracture: Secondary | ICD-10-CM | POA: Diagnosis present

## 2023-05-22 DIAGNOSIS — Z0189 Encounter for other specified special examinations: Secondary | ICD-10-CM | POA: Diagnosis not present

## 2023-05-22 DIAGNOSIS — F1120 Opioid dependence, uncomplicated: Secondary | ICD-10-CM | POA: Diagnosis present

## 2023-05-22 DIAGNOSIS — M4722 Other spondylosis with radiculopathy, cervical region: Secondary | ICD-10-CM | POA: Diagnosis not present

## 2023-05-22 DIAGNOSIS — F17290 Nicotine dependence, other tobacco product, uncomplicated: Secondary | ICD-10-CM | POA: Diagnosis present

## 2023-05-22 DIAGNOSIS — Z6832 Body mass index (BMI) 32.0-32.9, adult: Secondary | ICD-10-CM | POA: Diagnosis not present

## 2023-05-22 DIAGNOSIS — F418 Other specified anxiety disorders: Secondary | ICD-10-CM | POA: Diagnosis not present

## 2023-05-22 DIAGNOSIS — E039 Hypothyroidism, unspecified: Secondary | ICD-10-CM | POA: Diagnosis not present

## 2023-05-22 DIAGNOSIS — M797 Fibromyalgia: Secondary | ICD-10-CM | POA: Diagnosis present

## 2023-05-22 DIAGNOSIS — Z79899 Other long term (current) drug therapy: Secondary | ICD-10-CM | POA: Diagnosis not present

## 2023-05-22 DIAGNOSIS — F319 Bipolar disorder, unspecified: Secondary | ICD-10-CM | POA: Diagnosis present

## 2023-05-22 DIAGNOSIS — M4802 Spinal stenosis, cervical region: Secondary | ICD-10-CM | POA: Diagnosis not present

## 2023-05-22 DIAGNOSIS — M1990 Unspecified osteoarthritis, unspecified site: Secondary | ICD-10-CM | POA: Diagnosis present

## 2023-05-22 DIAGNOSIS — N289 Disorder of kidney and ureter, unspecified: Secondary | ICD-10-CM

## 2023-05-22 HISTORY — PX: ANTERIOR CERVICAL DECOMP/DISCECTOMY FUSION: SHX1161

## 2023-05-22 LAB — CBC
HCT: 36.8 % (ref 36.0–46.0)
Hemoglobin: 11.5 g/dL — ABNORMAL LOW (ref 12.0–15.0)
MCH: 27.3 pg (ref 26.0–34.0)
MCHC: 31.3 g/dL (ref 30.0–36.0)
MCV: 87.4 fL (ref 80.0–100.0)
Platelets: 204 10*3/uL (ref 150–400)
RBC: 4.21 MIL/uL (ref 3.87–5.11)
RDW: 14.5 % (ref 11.5–15.5)
WBC: 11.5 10*3/uL — ABNORMAL HIGH (ref 4.0–10.5)
nRBC: 0 % (ref 0.0–0.2)

## 2023-05-22 LAB — ABO/RH: ABO/RH(D): A POS

## 2023-05-22 LAB — CREATININE, SERUM
Creatinine, Ser: 1.16 mg/dL — ABNORMAL HIGH (ref 0.44–1.00)
GFR, Estimated: 53 mL/min — ABNORMAL LOW (ref >60)

## 2023-05-22 SURGERY — ANTERIOR CERVICAL DECOMPRESSION/DISCECTOMY FUSION 3 LEVELS
Anesthesia: General

## 2023-05-22 MED ORDER — KETAMINE HCL 10 MG/ML IJ SOLN
INTRAMUSCULAR | Status: DC | PRN
Start: 2023-05-22 — End: 2023-05-22
  Administered 2023-05-22: 10 mg via INTRAVENOUS
  Administered 2023-05-22: 20 mg via INTRAVENOUS

## 2023-05-22 MED ORDER — CHLORHEXIDINE GLUCONATE CLOTH 2 % EX PADS: 6.0000 | MEDICATED_PAD | Freq: Once | CUTANEOUS | Status: DC

## 2023-05-22 MED ORDER — SODIUM CHLORIDE 0.9% FLUSH: 3.0000 mL | INTRAVENOUS | Status: DC | PRN

## 2023-05-22 MED ORDER — ESCITALOPRAM OXALATE 10 MG PO TABS
10.0000 mg | ORAL_TABLET | Freq: Every day | ORAL | Status: DC
  Administered 2023-05-22 – 2023-05-25 (×4): 10 mg via ORAL
  Filled 2023-05-22 (×4): qty 1

## 2023-05-22 MED ORDER — ONDANSETRON HCL 4 MG/2ML IJ SOLN: 4.0000 mg | Freq: Once | INTRAMUSCULAR | Status: DC | PRN

## 2023-05-22 MED ORDER — MORPHINE SULFATE (PF) 2 MG/ML IV SOLN: 2.0000 mg | INTRAVENOUS | Status: DC | PRN

## 2023-05-22 MED ORDER — DEXAMETHASONE SODIUM PHOSPHATE 10 MG/ML IJ SOLN
INTRAMUSCULAR | Status: DC | PRN
  Administered 2023-05-22: 10 mg via INTRAVENOUS

## 2023-05-22 MED ORDER — ACETAMINOPHEN 325 MG PO TABS: 650.0000 mg | ORAL_TABLET | ORAL | Status: DC | PRN

## 2023-05-22 MED ORDER — THROMBIN 20000 UNITS EX SOLR
CUTANEOUS | Status: AC
  Filled 2023-05-22: qty 20000

## 2023-05-22 MED ORDER — MIDAZOLAM HCL 2 MG/2ML IJ SOLN
INTRAMUSCULAR | Status: AC
  Filled 2023-05-22: qty 2

## 2023-05-22 MED ORDER — ACETAMINOPHEN 650 MG RE SUPP: 650.0000 mg | RECTAL | Status: DC | PRN

## 2023-05-22 MED ORDER — THROMBIN 5000 UNITS EX SOLR
CUTANEOUS | Status: AC
  Filled 2023-05-22: qty 5000

## 2023-05-22 MED ORDER — CEFAZOLIN SODIUM-DEXTROSE 1-4 GM/50ML-% IV SOLN
1.0000 g | Freq: Three times a day (TID) | INTRAVENOUS | Status: AC
  Administered 2023-05-22 – 2023-05-23 (×2): 1 g via INTRAVENOUS
  Filled 2023-05-22 (×2): qty 50

## 2023-05-22 MED ORDER — PHENOL 1.4 % MT LIQD
1.0000 | OROMUCOSAL | Status: DC | PRN
  Administered 2023-05-23: 1 via OROMUCOSAL
  Filled 2023-05-22: qty 177

## 2023-05-22 MED ORDER — LIDOCAINE 2% (20 MG/ML) 5 ML SYRINGE
INTRAMUSCULAR | Status: AC
  Filled 2023-05-22: qty 5

## 2023-05-22 MED ORDER — ROCURONIUM BROMIDE 10 MG/ML (PF) SYRINGE
PREFILLED_SYRINGE | INTRAVENOUS | Status: AC
  Filled 2023-05-22: qty 10

## 2023-05-22 MED ORDER — ONDANSETRON HCL 4 MG PO TABS: 4.0000 mg | ORAL_TABLET | Freq: Four times a day (QID) | ORAL | Status: DC | PRN

## 2023-05-22 MED ORDER — MIDAZOLAM HCL 2 MG/2ML IJ SOLN
INTRAMUSCULAR | Status: DC | PRN
  Administered 2023-05-22: 2 mg via INTRAVENOUS

## 2023-05-22 MED ORDER — DEXAMETHASONE SODIUM PHOSPHATE 10 MG/ML IJ SOLN
INTRAMUSCULAR | Status: AC
  Filled 2023-05-22: qty 1

## 2023-05-22 MED ORDER — OXYCODONE HCL 5 MG PO TABS
5.0000 mg | ORAL_TABLET | ORAL | Status: DC | PRN
  Administered 2023-05-25 – 2023-05-26 (×2): 5 mg via ORAL
  Filled 2023-05-22 (×2): qty 1

## 2023-05-22 MED ORDER — LACTATED RINGERS IV SOLN: INTRAVENOUS | Status: DC

## 2023-05-22 MED ORDER — ONDANSETRON HCL 4 MG/2ML IJ SOLN
INTRAMUSCULAR | Status: AC
  Filled 2023-05-22: qty 2

## 2023-05-22 MED ORDER — ZOLPIDEM TARTRATE 5 MG PO TABS: 5.0000 mg | ORAL_TABLET | Freq: Every evening | ORAL | Status: DC | PRN

## 2023-05-22 MED ORDER — HYDROCODONE-ACETAMINOPHEN 7.5-325 MG PO TABS
1.0000 | ORAL_TABLET | Freq: Four times a day (QID) | ORAL | Status: DC
  Administered 2023-05-22 – 2023-05-25 (×13): 1 via ORAL
  Filled 2023-05-22 (×13): qty 1

## 2023-05-22 MED ORDER — FENTANYL CITRATE (PF) 100 MCG/2ML IJ SOLN
INTRAMUSCULAR | Status: AC
  Filled 2023-05-22: qty 2

## 2023-05-22 MED ORDER — ONDANSETRON HCL 4 MG/2ML IJ SOLN
INTRAMUSCULAR | Status: DC | PRN
  Administered 2023-05-22: 4 mg via INTRAVENOUS

## 2023-05-22 MED ORDER — LIDOCAINE-EPINEPHRINE 0.5 %-1:200000 IJ SOLN
INTRAMUSCULAR | Status: DC | PRN
  Administered 2023-05-22: 6 mL

## 2023-05-22 MED ORDER — MENTHOL 3 MG MT LOZG
1.0000 | LOZENGE | OROMUCOSAL | Status: DC | PRN
  Filled 2023-05-22: qty 9

## 2023-05-22 MED ORDER — PHENYLEPHRINE HCL-NACL 20-0.9 MG/250ML-% IV SOLN
INTRAVENOUS | Status: DC | PRN
  Administered 2023-05-22: 20 ug/min via INTRAVENOUS

## 2023-05-22 MED ORDER — PROPOFOL 10 MG/ML IV BOLUS
INTRAVENOUS | Status: AC
  Filled 2023-05-22: qty 20

## 2023-05-22 MED ORDER — SODIUM CHLORIDE 0.9 % IV SOLN
250.0000 mL | INTRAVENOUS | Status: DC
  Administered 2023-05-23: 250 mL via INTRAVENOUS

## 2023-05-22 MED ORDER — THROMBIN 20000 UNITS EX SOLR
CUTANEOUS | Status: DC | PRN
  Administered 2023-05-22: 20 mL via TOPICAL

## 2023-05-22 MED ORDER — ALBUMIN HUMAN 5 % IV SOLN: INTRAVENOUS | Status: DC | PRN

## 2023-05-22 MED ORDER — ORAL CARE MOUTH RINSE: 15.0000 mL | Freq: Once | OROMUCOSAL | Status: AC

## 2023-05-22 MED ORDER — LIDOCAINE-EPINEPHRINE 0.5 %-1:200000 IJ SOLN
INTRAMUSCULAR | Status: AC
  Filled 2023-05-22: qty 50

## 2023-05-22 MED ORDER — DIAZEPAM 5 MG PO TABS: 5.0000 mg | ORAL_TABLET | Freq: Four times a day (QID) | ORAL | Status: DC | PRN

## 2023-05-22 MED ORDER — CHLORHEXIDINE GLUCONATE 0.12 % MT SOLN
15.0000 mL | Freq: Once | OROMUCOSAL | Status: AC
  Administered 2023-05-22: 15 mL via OROMUCOSAL
  Filled 2023-05-22: qty 15

## 2023-05-22 MED ORDER — ACETAMINOPHEN 500 MG PO TABS
1000.0000 mg | ORAL_TABLET | Freq: Once | ORAL | Status: AC
  Administered 2023-05-22: 1000 mg via ORAL
  Filled 2023-05-22: qty 2

## 2023-05-22 MED ORDER — 0.9 % SODIUM CHLORIDE (POUR BTL) OPTIME
TOPICAL | Status: DC | PRN
  Administered 2023-05-22: 1000 mL

## 2023-05-22 MED ORDER — LACTATED RINGERS IV SOLN: INTRAVENOUS | Status: DC | PRN

## 2023-05-22 MED ORDER — ROCURONIUM BROMIDE 10 MG/ML (PF) SYRINGE
PREFILLED_SYRINGE | INTRAVENOUS | Status: DC | PRN
  Administered 2023-05-22: 60 mg via INTRAVENOUS
  Administered 2023-05-22 (×3): 20 mg via INTRAVENOUS

## 2023-05-22 MED ORDER — LAMOTRIGINE 25 MG PO TABS
100.0000 mg | ORAL_TABLET | Freq: Every day | ORAL | Status: DC
  Administered 2023-05-22 – 2023-05-25 (×4): 100 mg via ORAL
  Filled 2023-05-22 (×4): qty 4

## 2023-05-22 MED ORDER — KETAMINE HCL 50 MG/5ML IJ SOSY
PREFILLED_SYRINGE | INTRAMUSCULAR | Status: AC
  Filled 2023-05-22: qty 5

## 2023-05-22 MED ORDER — FENTANYL CITRATE (PF) 250 MCG/5ML IJ SOLN
INTRAMUSCULAR | Status: AC
  Filled 2023-05-22: qty 5

## 2023-05-22 MED ORDER — SUGAMMADEX SODIUM 200 MG/2ML IV SOLN
INTRAVENOUS | Status: DC | PRN
  Administered 2023-05-22: 200 mg via INTRAVENOUS

## 2023-05-22 MED ORDER — PHENYLEPHRINE 80 MCG/ML (10ML) SYRINGE FOR IV PUSH (FOR BLOOD PRESSURE SUPPORT)
PREFILLED_SYRINGE | INTRAVENOUS | Status: DC | PRN
  Administered 2023-05-22: 80 ug via INTRAVENOUS

## 2023-05-22 MED ORDER — PROPOFOL 10 MG/ML IV BOLUS
INTRAVENOUS | Status: DC | PRN
  Administered 2023-05-22: 170 mg via INTRAVENOUS

## 2023-05-22 MED ORDER — LIDOCAINE 2% (20 MG/ML) 5 ML SYRINGE
INTRAMUSCULAR | Status: DC | PRN
  Administered 2023-05-22: 80 mg via INTRAVENOUS

## 2023-05-22 MED ORDER — AMISULPRIDE (ANTIEMETIC) 5 MG/2ML IV SOLN: 10.0000 mg | Freq: Once | INTRAVENOUS | Status: DC | PRN

## 2023-05-22 MED ORDER — FENTANYL CITRATE (PF) 100 MCG/2ML IJ SOLN
25.0000 ug | INTRAMUSCULAR | Status: DC | PRN
  Administered 2023-05-22: 50 ug via INTRAVENOUS

## 2023-05-22 MED ORDER — HEPARIN SODIUM (PORCINE) 5000 UNIT/ML IJ SOLN
5000.0000 [IU] | Freq: Three times a day (TID) | INTRAMUSCULAR | Status: DC
  Administered 2023-05-22 – 2023-05-26 (×11): 5000 [IU] via SUBCUTANEOUS
  Filled 2023-05-22 (×11): qty 1

## 2023-05-22 MED ORDER — POTASSIUM CHLORIDE IN NACL 20-0.9 MEQ/L-% IV SOLN
INTRAVENOUS | Status: DC
  Filled 2023-05-22 (×6): qty 1000

## 2023-05-22 MED ORDER — SODIUM CHLORIDE 0.9% FLUSH
3.0000 mL | Freq: Two times a day (BID) | INTRAVENOUS | Status: DC
  Administered 2023-05-22 – 2023-05-25 (×7): 3 mL via INTRAVENOUS

## 2023-05-22 MED ORDER — THROMBIN 5000 UNITS EX SOLR
OROMUCOSAL | Status: DC | PRN
  Administered 2023-05-22 (×3): 5 mL via TOPICAL

## 2023-05-22 MED ORDER — ONDANSETRON HCL 4 MG/2ML IJ SOLN: 4.0000 mg | Freq: Four times a day (QID) | INTRAMUSCULAR | Status: DC | PRN

## 2023-05-22 MED ORDER — FENTANYL CITRATE (PF) 250 MCG/5ML IJ SOLN
INTRAMUSCULAR | Status: DC | PRN
  Administered 2023-05-22: 50 ug via INTRAVENOUS
  Administered 2023-05-22: 100 ug via INTRAVENOUS
  Administered 2023-05-22 (×2): 50 ug via INTRAVENOUS

## 2023-05-22 MED ORDER — CEFAZOLIN SODIUM 1 G IJ SOLR
INTRAMUSCULAR | Status: AC
  Filled 2023-05-22: qty 20

## 2023-05-22 MED ORDER — CEFAZOLIN SODIUM-DEXTROSE 2-4 GM/100ML-% IV SOLN
2.0000 g | INTRAVENOUS | Status: AC
  Administered 2023-05-22: 2 g via INTRAVENOUS
  Filled 2023-05-22: qty 100

## 2023-05-22 MED ORDER — OXYCODONE HCL 5 MG PO TABS
10.0000 mg | ORAL_TABLET | ORAL | Status: DC | PRN
  Administered 2023-05-23 – 2023-05-26 (×10): 10 mg via ORAL
  Filled 2023-05-22 (×10): qty 2

## 2023-05-22 SURGICAL SUPPLY — 55 items
ADH SKN CLS APL DERMABOND .7 (GAUZE/BANDAGES/DRESSINGS) ×1
BAG COUNTER SPONGE SURGICOUNT (BAG) ×1 IMPLANT
BAG SPNG CNTER NS LX DISP (BAG) ×1
BLADE CLIPPER SURG (BLADE) IMPLANT
BONE SPACER C-PLUG 14X7X12 (Bone Implant) IMPLANT
BONE SPACER C-PLUG 14X8X12 (Bone Implant) IMPLANT
BONE SPACER C-PLUG 14X9X12 (Bone Implant) IMPLANT
BUR DRUM 4.0 (BURR) ×1 IMPLANT
BUR MATCHSTICK NEURO 3.0 LAGG (BURR) ×1 IMPLANT
CANISTER SUCT 3000ML PPV (MISCELLANEOUS) ×1 IMPLANT
DERMABOND ADVANCED .7 DNX12 (GAUZE/BANDAGES/DRESSINGS) ×1 IMPLANT
DRAPE HALF SHEET 40X57 (DRAPES) IMPLANT
DRAPE LAPAROTOMY 100X72 PEDS (DRAPES) ×1 IMPLANT
DRAPE MICROSCOPE SLANT 54X150 (MISCELLANEOUS) ×1 IMPLANT
DRSG OPSITE POSTOP 4X6 (GAUZE/BANDAGES/DRESSINGS) IMPLANT
DURAPREP 6ML APPLICATOR 50/CS (WOUND CARE) ×1 IMPLANT
ELECT COATED BLADE 2.86 ST (ELECTRODE) ×1 IMPLANT
ELECT REM PT RETURN 9FT ADLT (ELECTROSURGICAL) ×1
ELECTRODE REM PT RTRN 9FT ADLT (ELECTROSURGICAL) ×1 IMPLANT
EVACUATOR 1/8 PVC DRAIN (DRAIN) IMPLANT
GAUZE 4X4 16PLY ~~LOC~~+RFID DBL (SPONGE) IMPLANT
GLOVE ECLIPSE 6.5 STRL STRAW (GLOVE) ×1 IMPLANT
GLOVE EXAM NITRILE XL STR (GLOVE) IMPLANT
GOWN STRL REUS W/ TWL LRG LVL3 (GOWN DISPOSABLE) ×2 IMPLANT
GOWN STRL REUS W/ TWL XL LVL3 (GOWN DISPOSABLE) IMPLANT
GOWN STRL REUS W/TWL 2XL LVL3 (GOWN DISPOSABLE) IMPLANT
GOWN STRL REUS W/TWL LRG LVL3 (GOWN DISPOSABLE) ×2
GOWN STRL REUS W/TWL XL LVL3 (GOWN DISPOSABLE)
HALTER HD/CHIN CERV TRACTION D (MISCELLANEOUS) IMPLANT
HEMOSTAT POWDER KIT SURGIFOAM (HEMOSTASIS) IMPLANT
HEMOSTAT SURGICEL 2X14 (HEMOSTASIS) IMPLANT
KIT BASIN OR (CUSTOM PROCEDURE TRAY) ×1 IMPLANT
KIT TURNOVER KIT B (KITS) ×1 IMPLANT
NDL HYPO 25X1 1.5 SAFETY (NEEDLE) ×1 IMPLANT
NDL SPNL 22GX3.5 QUINCKE BK (NEEDLE) ×1 IMPLANT
NEEDLE HYPO 25X1 1.5 SAFETY (NEEDLE) ×1 IMPLANT
NEEDLE SPNL 22GX3.5 QUINCKE BK (NEEDLE) ×1 IMPLANT
NS IRRIG 1000ML POUR BTL (IV SOLUTION) ×1 IMPLANT
PACK LAMINECTOMY NEURO (CUSTOM PROCEDURE TRAY) ×1 IMPLANT
PAD ARMBOARD 7.5X6 YLW CONV (MISCELLANEOUS) ×1 IMPLANT
PATTIES SURGICAL 1X1 (DISPOSABLE) IMPLANT
PIN DISTRACTION 14MM (PIN) IMPLANT
PLATE HELIX T 58MM (Plate) IMPLANT
SCREW SELF DRILL VARIABLE 13MM (Screw) IMPLANT
SCREW SELF TAP 13MM VARIABLE (Screw) IMPLANT
SPIKE FLUID TRANSFER (MISCELLANEOUS) ×1 IMPLANT
SPONGE INTESTINAL PEANUT (DISPOSABLE) ×1 IMPLANT
SPONGE SURGIFOAM ABS GEL 100 (HEMOSTASIS) IMPLANT
SUT VIC AB 0 CT1 27 (SUTURE) ×1
SUT VIC AB 0 CT1 27XBRD ANTBC (SUTURE) IMPLANT
SUT VIC AB 3-0 SH 8-18 (SUTURE) ×1 IMPLANT
TOWEL GREEN STERILE (TOWEL DISPOSABLE) ×1 IMPLANT
TOWEL GREEN STERILE FF (TOWEL DISPOSABLE) ×1 IMPLANT
TRAY FOLEY MTR SLVR 16FR STAT (SET/KITS/TRAYS/PACK) IMPLANT
WATER STERILE IRR 1000ML POUR (IV SOLUTION) ×1 IMPLANT

## 2023-05-22 NOTE — Transfer of Care (Signed)
Immediate Anesthesia Transfer of Care Note  Patient: Daisy [name]  Procedure(s) Performed: Anterior Cervical Discectomy Fusion - Cervical Four-Cervical Five - Cervical Five-Cervical Six - Cervical Six-Cervical Seven  Patient Location: PACU  Anesthesia Type:General  Level of Consciousness: drowsy and patient cooperative  Airway & Oxygen Therapy: Patient Spontanous Breathing and Patient connected to nasal cannula oxygen  Post-op Assessment: Report given to RN, Post -op Vital signs reviewed and stable, and Patient moving all extremities X 4  Post vital signs: Reviewed and stable  Last Vitals:  Vitals Value Taken Time  BP 154/79 05/22/23 1846  Temp    Pulse 109 05/22/23 1849  Resp 13 05/22/23 1849  SpO2 91 % 05/22/23 1849  Vitals shown include unfiled device data.  Last Pain:  Vitals:   05/22/23 1124  TempSrc:   PainSc: 7       Patients Stated Pain Goal: 0 (05/22/23 1124)  Complications: There were no known notable events for this encounter.

## 2023-05-22 NOTE — Op Note (Signed)
05/22/2023  7:17 PM  PATIENT:  Daisy [name]  62 y.o. 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  BLOOD ADMINISTERED:none  CELL SAVER GIVEN:none  COUNT:per nursing  DRAINS: (1) Hemovact drain(s) in the prevertebral space with  Suction Open   SPECIMEN:  No Specimen  DICTATION: Daisy [name] was taken to the operating room, intubated, and placed under general anesthesia without difficulty. She was positioned supine with her head in slight extension on a horseshoe headrest. The neck was prepped and draped in a sterile manner. I infiltrated 6 cc's 1/2%lidocaine/1:200,000 strength epinephrine into the planned incision starting from the midline to the medial border of the left sternocleidomastoid muscle. I opened the incision with a 10 blade and dissected sharply through soft tissue to the platysma. I dissected in the plane superior to the platysma both rostrally and caudally. I then opened the platysma in a horizontal fashion with Metzenbaum scissors, and dissected in the inferior plane rostrally and caudally. With both blunt and sharp technique I created an avascular corridor to the cervical spine. I placed a spinal needle(s) in the disc space at C4/5 . I then reflected the longus colli from C4 to C7 and placed self retaining retractors. I opened the disc space(s) at 4/5,5/6,and 6/7 with a 15 blade, and Kerrison punches. Prolific anterior spurring overlying the three disc spaces. I removed disc with curettes, Kerrison punches, and the drill. Using the  drill I removed osteophytes and prepared for the decompression.  I decompressed the spinal canal and the C5,6, and 7 root(s) with the drill, Kerrison punches, and the curettes. I used the microscope to aid in microdissection. I removed the posterior longitudinal ligament to fully expose and decompress the thecal sac. I exposed the roots laterally taking down the 4/5,5/6,6/7 uncovertebral joints. With the decompression complete I moved on to the arthrodesis. I used the drill to level the surfaces of C4,5,6,and 7. I removed soft tissue to prepare the disc space and the bony surfaces. I measured the space and placed appropriate sized structural allografts into the disc spaces.  I then placed the anterior instrumentation. I placed 2 screws in each vertebral body through the plate. I locked the screws into place. Intraoperative xray showed the graft, plate, and screws to be in good position. I irrigated the wound, achieved hemostasis, and closed the wound in layers. I approximated the platysma, and the subcuticular plane with vicryl sutures. I used Dermabond for a sterile dressing.   PLAN OF CARE: Admit to inpatient   PATIENT DISPOSITION:  PACU - hemodynamically stable.   Delay start of Pharmacological VTE agent (>24hrs) due to surgical blood loss or risk of bleeding:  yes

## 2023-05-22 NOTE — H&P (Signed)
BP (!) 145/70   Pulse 75   Temp 98.4 F (36.9 C) (Oral)   Resp 18   Ht 5\' 3"  (1.6 m)   Wt 81.6 kg   SpO2 95%   BMI 31.89 kg/m   Daisy [name] is a patient who comes in today for evaluation of pain that she has in her neck.  Her vital signs are as follows:  Height 63 inches, weight 186 pounds, BMI is 32.95. Blood pressure 150/88, pulse 73.  Pain is 6/10.     She takes Escitalopram, Lamotrigine, Methocarbamol, Vitamin D2.     On her intake, she states that she is having pain in the neck, shoulder, and hands.  Says the pain began gradually.  She is not really sure when it started.  PAST SURGICAL HISTORY: Includes 2 cesarean sections.  She has undergone an appendectomy, cholecystectomy, 2 herniorrhaphies.  She was septic secondary to urinary tract infection, hematoma on the left kidney.  ALLERGIES: She has no known drug allergies.  REVIEW OF SYSTEMS: Positive for easy bruising, general weakness, joint pain, muscle weakness, anxiety, joint swelling, arm and leg pain, numbness and tingling, depression, psychiatric disorder, headache, back pain, neck pain, easy bleeding, hair loss, fatigue, and weight gain.  PAST MEDICAL HISTORY: Significant for osteoporosis and chronic renal disease.  She has had narcotics and pain management, massage therapy without relief.  She does feel weakness in the upper extremities.  No problems with bowel or bladder.  SOCIAL HISTORY: She is married.  She does have children.  She does not use alcohol.  She does use tobacco.  She vapes.     I had seen her in July of 2020 and at that time she also presented with severe neck pain, which she had had for 4 months at that time. At that time she had, as I described, a severe profound facet arthropathy at C3-4, C4-5 on the left, C5-6 on the right. C6-7 facet arthropathy was not nearly as bad, but significant foraminal narrowing bilaterally and there was some canal stenosis present at C6-7.  PHYSICAL EXAMINATION: She is  alert oriented by 4.  She answers all questions appropriately.  Memory, language, attention span, and fund of knowledge are otherwise normal.  Muscle tone, bulk, coordination is normal.  She can toe walk, heel walk, and do a squat without difficulty.  2+ reflexes, biceps, triceps, brachioradialis, knees, and ankles.  IMAGING: The MRI essentially shows the same findings that she had had previously, which is severe and significant facet arthropathy.  She has anterolisthesis at C4-5 and at C5-6, disc degeneration at C6-7.  ASSESSMENT AND PLAN: Looking at the films, considering that she has had injections in the past without relief, I do believe that it is certainly indicated to go ahead with an ACDF at C4-5, C5-6 and C6-7.  She has had well now over 4 years of dealing with this problem.  She has had 4 years of conservative treatment and, given this amount of time and the lack of response with physical therapy also, I believe that the operation is in her best interest.

## 2023-05-22 NOTE — Anesthesia Postprocedure Evaluation (Signed)
Anesthesia Post Note  Patient: Daisy [name]  Procedure(s) Performed: Anterior Cervical Discectomy Fusion - Cervical Four-Cervical Five - Cervical Five-Cervical Six - Cervical Six-Cervical Seven     Patient location during evaluation: PACU Anesthesia Type: General Level of consciousness: awake and alert Pain management: pain level controlled Vital Signs Assessment: post-procedure vital signs reviewed and stable Respiratory status: spontaneous breathing, nonlabored ventilation, respiratory function stable and patient connected to nasal cannula oxygen Cardiovascular status: blood pressure returned to baseline and stable Postop Assessment: no apparent nausea or vomiting Anesthetic complications: no   There were no known notable events for this encounter.  Last Vitals:  Vitals:   05/22/23 1930 05/22/23 1949  BP: (!) 148/86 (!) 143/73  Pulse: 99 92  Resp: 10   Temp: 36.8 C 36.8 C  SpO2: 95% 95%    Last Pain:  Vitals:   05/22/23 1949  TempSrc: Oral  PainSc:                  [name]

## 2023-05-22 NOTE — Anesthesia Preprocedure Evaluation (Addendum)
Anesthesia Evaluation  Patient identified by MRN, date of birth, ID band Patient awake    Reviewed: Allergy & Precautions, NPO status , Patient's Chart, lab work & pertinent test results  Airway Mallampati: II  TM Distance: >3 FB Neck ROM: Limited    Dental  (+) Dental Advisory Given, Lower Dentures, Upper Dentures   Pulmonary Current SmokerPatient did not abstain from smoking.   Pulmonary exam normal breath sounds clear to auscultation       Cardiovascular negative cardio ROS Normal cardiovascular exam Rhythm:Regular Rate:Normal     Neuro/Psych  Headaches PSYCHIATRIC DISORDERS Anxiety Depression Bipolar Disorder   Cervical stenosis   Neuromuscular disease    GI/Hepatic negative GI ROS, Neg liver ROS,,,  Endo/Other  Hypothyroidism  Obesity   Renal/GU Renal InsufficiencyRenal disease     Musculoskeletal  (+) Arthritis ,  Fibromyalgia -, narcotic dependent  Abdominal   Peds  Hematology negative hematology ROS (+)   Anesthesia Other Findings Day of surgery medications reviewed with the patient.  Reproductive/Obstetrics                             Anesthesia Physical Anesthesia Plan  ASA: 3  Anesthesia Plan: General   Post-op Pain Management: Tylenol PO (pre-op)*   Induction: Intravenous  PONV Risk Score and Plan: 3 and Midazolam, Dexamethasone and Ondansetron  Airway Management Planned: Oral ETT and Video Laryngoscope Planned  Additional Equipment:   Intra-op Plan:   Post-operative Plan: Extubation in OR  Informed Consent: I have reviewed the patients History and Physical, chart, labs and discussed the procedure including the risks, benefits and alternatives for the proposed anesthesia with the patient or authorized representative who has indicated his/her understanding and acceptance.     Dental advisory given  Plan Discussed with: CRNA  Anesthesia Plan Comments: (2nd  PIV after induction )       Anesthesia Quick Evaluation

## 2023-05-22 NOTE — Plan of Care (Signed)

## 2023-05-22 NOTE — Anesthesia Procedure Notes (Signed)
Procedure Name: Intubation Date/Time: 05/22/2023 2:13 PM  Performed by: Owens Loffler, RNPre-anesthesia Checklist: Patient identified, Emergency Drugs available, Suction available, Patient being monitored and Timeout performed Patient Re-evaluated:Patient Re-evaluated prior to induction Oxygen Delivery Method: Circle system utilized Preoxygenation: Pre-oxygenation with 100% oxygen Induction Type: IV induction Ventilation: Mask ventilation without difficulty Laryngoscope Size: Glidescope and 3 Grade View: Grade I Tube type: Oral Tube size: 7.0 mm Number of attempts: 1 Airway Equipment and Method: Stylet and Video-laryngoscopy Placement Confirmation: ETT inserted through vocal cords under direct vision, positive ETCO2 and breath sounds checked- equal and bilateral Secured at: 22 cm Tube secured with: Tape Dental Injury: Teeth and Oropharynx as per pre-operative assessment  Comments: Maintained head and neck in neutral position throughout mask ventilation and intubation.

## 2023-05-23 LAB — GLUCOSE, CAPILLARY: Glucose-Capillary: 143 mg/dL — ABNORMAL HIGH (ref 70–99)

## 2023-05-23 NOTE — Progress Notes (Signed)
   Providing Compassionate, Quality Care - Together  NEUROSURGERY PROGRESS NOTE   S: No issues overnight.   O: EXAM:  BP (!) 161/86 (BP Location: Left Arm)   Pulse 78   Temp 97.6 F (36.4 C) (Oral)   Resp 17   Ht 5\' 3"  (1.6 m)   Wt 81.6 kg   SpO2 98%   BMI 31.89 kg/m   Awake, alert, oriented x 3 PERRL Speech fluent, appropriate  CNs grossly intact  5/5 BUE/BLE  Hemovac in place Neck soft, trachea midline, normal phonation  ASSESSMENT:  62 y.o. female with   Status post C4-7 ACDF  PLAN: -PT/OT -Monitor Hemovac -Doing well    Thank you for allowing me to participate in this patient's care.  Please do not hesitate to call with questions or concerns.   Monia Pouch, DO Neurosurgeon [facility] Cell: [phone]

## 2023-05-23 NOTE — Evaluation (Signed)
Occupational Therapy Evaluation and Discharge Patient Details Name: Daisy [name] MRN: [id number] DOB: [birth year] Today's Date: 05/23/2023   History of Present Illness Pt is a 62 y.o. female admitted 05/22/23 for elective ACDF C4-7. PMH includes fibromyalgia, PTSD, Bipolar 1, chronic pain, depression, anxiety, CKD.   Clinical Impression   This 62 yo female admitted and underwent above presents to acute OT with all education completed with her and her husband as well as post op cervical education handout provided. We will D/C from acute OT.       If plan is discharge home, recommend the following: A little help with bathing/dressing/bathroom;Help with stairs or ramp for entrance;Assist for transportation;Assistance with cooking/housework    Functional Status Assessment  Patient has had a recent decline in their functional status and demonstrates the ability to make significant improvements in function in a reasonable and predictable amount of time. (without further need for skilled OT services, all education and post op cervical handout provided)  Equipment Recommendations  None recommended by OT       Precautions / Restrictions Precautions Precautions: Cervical;Other (comment) Precaution Booklet Issued: Yes (comment) (went over handout with her for parts that pertained to OT) Precaution Comments: cervical hemovac Restrictions Weight Bearing Restrictions: No Other Position/Activity Restrictions: orders say no brace; pt got a brace from MD office prior to surgery so not sure if no brace needed due to surgery did not actually warrant it or because she already had a brace to bring in             ADL either performed or assessed with clinical judgement   ADL                                         General ADL Comments: Educated on sequence of dressing for pullover shirt and for socks/shoes; use of straws for drinking; use to 2 cups rinse and spit for mouth care,  sleeping/resting more upright for the first week; rolling to get OOB once she feels she can lay flat in bed; watching how she moves her neck when in shower due to no hand held shower head     Vision Patient Visual Report: No change from baseline              Pertinent Vitals/Pain Pain Assessment Pain Assessment: 0-10 Pain Score: 8  Pain Location: when she swallows and moves; operative site Pain Descriptors / Indicators: Sore, Operative site guarding, Grimacing, Guarding Pain Intervention(s): Limited activity within patient's tolerance, Monitored during session, Patient requesting pain meds-RN notified (sent chat text to RN about pain meds and spray for throat)     Extremity/Trunk Assessment Upper Extremity Assessment Upper Extremity Assessment: Overall WFL for tasks assessed      Communication Communication Communication: No apparent difficulties   Cognition Arousal: Alert   Overall Cognitive Status: Within Functional Limits for tasks assessed                                       General Comments  initiated educ re: cervical precautions, activity recommendations, discharge needs. pt's husband present and supportive; RN notified that pt currently missing cervical collar that apparently was taken to OR with her yesterday. pt received on O2 ; removed and SpO2 >/95% on RA with activity, HR 91  Home Living Family/patient expects to be discharged to:: Private residence Living Arrangements: Spouse/significant other Available Help at Discharge: Family;Available 24 hours/day Type of Home: House Home Access: Stairs to enter [employer] of Steps: 5 Entrance Stairs-Rails: Right Home Layout: One level     Bathroom Shower/Tub: Tub/shower unit;Curtain   Bathroom Toilet: Standard     Home Equipment: None   Additional Comments: reports she can borrow shower seat from friend if needed. husband works as Music therapist but plans to be home as  long as needed to help      Prior Functioning/Environment Prior Level of Function : Independent/Modified Independent             Mobility Comments: independent without DME, though limited mobility the past few months due to neck pain ADLs Comments: independent        OT Problem List: Pain         OT Goals(Current goals can be found in the care plan section) Acute Rehab OT Goals Patient Stated Goal: home maybe tomorrow         AM-PAC OT "6 Clicks" Daily Activity     Outcome Measure Help from another person eating meals?: None Help from another person taking care of personal grooming?: None Help from another person toileting, which includes using toliet, bedpan, or urinal?: None Help from another person bathing (including washing, rinsing, drying)?: None Help from another person to put on and taking off regular upper body clothing?: None Help from another person to put on and taking off regular lower body clothing?: None 6 Click Score: 24   End of Session Nurse Communication: Patient requests pain meds (and spray for throat)  Activity Tolerance: Patient tolerated treatment well Patient left: in bed;with call bell/phone within reach;with family/visitor present  OT Visit Diagnosis: Pain Pain - part of body:  (incisional and throar)                Time: 8119-1478 OT Time Calculation (min): 20 min Charges:  OT General Charges $OT Visit: 1 Visit OT Evaluation $OT Eval Moderate Complexity: 1 Mod  Cathy L. OT Acute Rehabilitation Services Office [phone]    Evette Georges 05/23/2023, 12:17 PM

## 2023-05-23 NOTE — Evaluation (Signed)
Physical Therapy Evaluation & Discharge Patient Details Name: Daisy [name] MRN: [id number] DOB: October 20, 1960 Today's Date: 05/23/2023  History of Present Illness  Pt is a 62 y.o. female admitted 05/22/23 for elective ACDF C4-7. PMH includes fibromyalgia, PTSD, Bipolar 1, chronic pain, depression, anxiety, CKD.   Clinical Impression  Patient evaluated by Physical Therapy with no further acute PT needs identified. PTA, pt independent though mobility limited secondary to worsening pain the past few months; lives with husband who will be available to assist at d/c. Today, pt progressing to independent transfers and ambulation without DME. Educ re: precautions, positioning, activity recommendations. All education has been completed and the patient has no further questions. Acute PT is signing off. Thank you for this referral.    SpO2 95% on RA, HR 91     If plan is discharge home, recommend the following: A little help with bathing/dressing/bathroom;Assistance with cooking/housework;Assist for transportation   Can travel by private vehicle    Yes    Equipment Recommendations None recommended by PT  Recommendations for Other Services   Occupational Therapist    Functional Status Assessment       Precautions / Restrictions Precautions Precautions: Cervical;Other (comment) Precaution Booklet Issued: Yes (comment) Precaution Comments: cervical hemovac      Mobility  Bed Mobility Overal bed mobility: Modified Independent                  Transfers Overall transfer level: Independent Equipment used: None               General transfer comment: progressing to indep standing from EOB, low toilet height without DME    Ambulation/Gait Ambulation/Gait assistance: Independent Gait Distance (Feet): 350 Feet Assistive device: None Gait Pattern/deviations: Step-through pattern, Decreased stride length Gait velocity: Decreased     General Gait Details: slow, steady gait  independent with and without managing IV pole and hemovac; pt with good ability to maintain cervical precautions in busy hallway environment  Stairs Stairs:  (pt declined stair training; encouraged rail use at home for fall risk reduction)          Wheelchair Mobility     Tilt Bed    Modified Rankin (Stroke Patients Only)       Balance Overall balance assessment: Needs assistance Sitting-balance support: No upper extremity supported, Feet supported Sitting balance-Leahy Scale: Good Sitting balance - Comments: indep with toileting   Standing balance support: No upper extremity supported, During functional activity Standing balance-Leahy Scale: Good                               Pertinent Vitals/Pain Pain Assessment Pain Assessment: Faces Faces Pain Scale: Hurts little more Pain Location: cervical incision Pain Descriptors / Indicators: Sore, Operative site guarding, Grimacing Pain Intervention(s): Limited activity within patient's tolerance, Monitored during session    Home Living Family/patient expects to be discharged to:: Private residence Living Arrangements: Spouse/significant other Available Help at Discharge: Family;Available 24 hours/day Type of Home: House Home Access: Stairs to enter Entrance Stairs-Rails: Right Entrance Stairs-Number of Steps: 5   Home Layout: One level Home Equipment: None Additional Comments: reports she can borrow shower seat from friend if needed. husband works as Music therapist but plans to be home as long as needed to help    Prior Function Prior Level of Function : Independent/Modified Independent             Mobility Comments: independent without DME, though  limited mobility the past few months due to neck pain ADLs Comments: independent     Extremity/Trunk Assessment   Upper Extremity Assessment Upper Extremity Assessment: Overall WFL for tasks assessed    Lower Extremity Assessment Lower Extremity  Assessment: Overall WFL for tasks assessed    Cervical / Trunk Assessment Cervical / Trunk Assessment: Neck Surgery  Communication   Communication Communication: No apparent difficulties  Cognition Arousal: Alert Behavior During Therapy: WFL for tasks assessed/performed Overall Cognitive Status: Within Functional Limits for tasks assessed                                          General Comments General comments (skin integrity, edema, etc.): initiated educ re: cervical precautions, activity recommendations, discharge needs. pt's husband present and supportive; RN notified that pt currently missing cervical collar that apparently was taken to OR with her yesterday. pt received on O2 [place]; removed and SpO2 >/95% on RA with activity, HR 91    Exercises     Assessment/Plan    PT Assessment Patient does not need any further PT services  PT Problem List         PT Treatment Interventions      PT Goals (Current goals can be found in the Care Plan section)  Acute Rehab PT Goals PT Goal Formulation: All assessment and education complete, DC therapy    Frequency       Co-evaluation               AM-PAC PT "6 Clicks" Mobility  Outcome Measure Help needed turning from your back to your side while in a flat bed without using bedrails?: None Help needed moving from lying on your back to sitting on the side of a flat bed without using bedrails?: None Help needed moving to and from a bed to a chair (including a wheelchair)?: None Help needed standing up from a chair using your arms (e.g., wheelchair or bedside chair)?: None Help needed to walk in hospital room?: None Help needed climbing 3-5 steps with a railing? : None 6 Click Score: 24    End of Session Equipment Utilized During Treatment: Gait belt Activity Tolerance: Patient tolerated treatment well Patient left: in bed;with call bell/phone within reach;with family/visitor present Nurse Communication:  Mobility status PT Visit Diagnosis: Other abnormalities of gait and mobility (R26.89);Pain    Time: 4098-1191 PT Time Calculation (min) (ACUTE ONLY): 21 min   Charges:   PT Evaluation $PT Eval Low Complexity: 1 Low   PT General Charges $$ ACUTE PT VISIT: 1 Visit       Ina Homes, PT, DPT Acute Rehabilitation Services  Personal: Secure Chat Rehab Office: [phone]  Malachy Chamber 05/23/2023, 11:38 AM

## 2023-05-24 NOTE — Progress Notes (Signed)
   Providing Compassionate, Quality Care - Together  NEUROSURGERY PROGRESS NOTE   S: No issues overnight.  Does not feel quite ready to go home yet today  O: EXAM:  BP (!) 152/75 (BP Location: Right Arm)   Pulse 78   Temp 98.4 F (36.9 C) (Oral)   Resp 15   Ht 5\' 3"  (1.6 m)   Wt 81.6 kg   SpO2 96%   BMI 31.89 kg/m   Awake, alert, oriented x 3 PERRL Speech fluent, appropriate  CNs grossly intact  5/5 BUE/BLE  Hemovac in place Neck soft, trachea midline, normal phonation   ASSESSMENT:  62 y.o. female with    Status post C4-7 ACDF   PLAN: -PT/OT -Discontinue Hemovac -Doing well, plan discharge tomorrow    Thank you for allowing me to participate in this patient's care.  Please do not hesitate to call with questions or concerns.   Monia Pouch, DO Neurosurgeon [facility] Cell: [phone]

## 2023-05-24 NOTE — Plan of Care (Signed)

## 2023-05-25 LAB — GLUCOSE, CAPILLARY: Glucose-Capillary: 156 mg/dL — ABNORMAL HIGH (ref 70–99)

## 2023-05-25 MED FILL — Thrombin For Soln 5000 Unit: CUTANEOUS | Qty: 5000 | Status: AC

## 2023-05-25 NOTE — Plan of Care (Signed)
  Problem: Activity: Goal: Risk for activity intolerance will decrease Outcome: Progressing   Problem: Nutrition: Goal: Adequate nutrition will be maintained Outcome: Progressing   Problem: Coping: Goal: Level of anxiety will decrease Outcome: Progressing   Problem: Pain Managment: Goal: General experience of comfort will improve Outcome: Progressing   Problem: Safety: Goal: Ability to remain free from injury will improve Outcome: Progressing   

## 2023-05-25 NOTE — Progress Notes (Signed)
Transition of Care [facility]) - Inpatient Brief Assessment   Patient Details  Name: EVERLYNN SWAGER MRN: [id number] Date of Birth: April 22, 1961  Transition of [facility]) CM/SW Contact:    Janae Bridgeman, RN Phone Number: 05/25/2023, 2:32 PM   Clinical Narrative: CM met with the patient at the bedside to discuss TOC needs to return home.  The patient lives at home with her husband and will discharge home when she is medically stable to discharge.  Patient is pending evaluation by attending physician today with likely discharge to home once the neck drain is removed by attending MD.    No needs for [facility] Team at this time.    Transition of Care Asessment: Insurance and Status: (P) Insurance coverage has been reviewed Patient has primary care physician: (P) Yes Home environment has been reviewed: (P) Yes - home with husband Prior level of function:: (P) Independent Prior/Current Home Services: (P) No current home services Social Determinants of Health Reivew: (P) SDOH reviewed no interventions necessary Readmission risk has been reviewed: (P) Yes Transition of care needs: (P) no transition of care needs at this time

## 2023-05-25 NOTE — Progress Notes (Signed)
Incentive spirometry introduced with teach back 

## 2023-05-25 NOTE — Progress Notes (Signed)
Patient ID: Daisy [name], female   DOB: 08-11-1961, 62 y.o.   MRN: [id number] BP (!) 147/62 (BP Location: Right Arm)   Pulse 81   Temp 98.1 F (36.7 C) (Oral)   Resp 16   Ht 5\' 3"  (1.6 m)   Wt 81.6 kg   SpO2 97%   BMI 31.89 kg/m  Hemovac just removed.  Weakness in right upper extremity, pain limited in right arm and shoulder Wound is clean, dry, no signs of infection Discharge tomorrow

## 2023-05-26 LAB — GLUCOSE, CAPILLARY: Glucose-Capillary: 162 mg/dL — ABNORMAL HIGH (ref 70–99)

## 2023-05-26 MED ORDER — TIZANIDINE HCL 4 MG PO TABS: 4.0000 mg | ORAL_TABLET | Freq: Three times a day (TID) | ORAL | 0 refills | Status: DC | PRN

## 2023-05-26 NOTE — Discharge Summary (Signed)
BP (!) 149/84 (BP Location: Left Arm)   Pulse 90   Temp 98.4 F (36.9 C) (Oral)   Resp 16   Ht 5\' 3"  (1.6 m)   Wt 81.6 kg   SpO2 97%   BMI 31.89 kg/m  Physician Discharge Summary  Patient ID: Daisy [name] MRN: [id number] DOB/AGE: 02/03/1961 62 y.o.  Admit date: 05/22/2023 Discharge date: 05/26/2023  Admission Diagnoses:cervical spinal stenosis  Discharge Diagnoses:  Principal Problem:   Cervical spinal stenosis Active Problems:   Cervical spondylosis with radiculopathy   Discharged Condition: good  Hospital Course: Daisy [name] was admitted and taken to the operating room for an ACDF C4/5,5/6,6/7 with nuvasive hardware. Post op she has done well. There is weakness in right deltoid, biceps, grip. Wound is clean, dry, no signs of infection. She is speaking well, voice is strong. Daisy [name] is voiding, and ambulating well.   Treatments: surgery:  Anterior Cervical decompression C4/5,5/6,6/7 Arthrodesis C4/5/6, and 6/7 with 8,7,and 8mm structural allografts Anterior instrumentation(nuvasive) C4-7  Discharge Exam: Blood pressure (!) 149/84, pulse 90, temperature 98.4 F (36.9 C), temperature source Oral, resp. rate 16, height 5\' 3"  (1.6 m), weight 81.6 kg, SpO2 97%. General appearance: alert, cooperative, appears stated age, and no distress  Disposition: Discharge disposition: 01-Home or Self Care      Stenosis  Allergies as of 05/26/2023   No Known Allergies      Medication List     TAKE these medications    escitalopram 10 MG tablet Commonly known as: LEXAPRO Take 1 tablet by mouth once daily What changed:  how much to take how to take this when to take this additional instructions   lamoTRIgine 100 MG tablet Commonly known as: LAMICTAL Take 1 tablet (100 mg total) by mouth at bedtime.   methocarbamol 500 MG tablet Commonly known as: ROBAXIN Take 500 mg by mouth 2 (two) times daily as needed for muscle spasms.   naloxone 4 MG/0.1ML Liqd nasal  spray kit Commonly known as: NARCAN Place 1 spray into the nose as needed (opioid reversal).   oxyCODONE-acetaminophen 5-325 MG tablet Commonly known as: PERCOCET/ROXICET Take 1 tablet by mouth in the morning, at noon, and at bedtime.   tiZANidine 4 MG tablet Commonly known as: ZANAFLEX Take 1 tablet (4 mg total) by mouth every 8 (eight) hours as needed for muscle spasms.   Vitamin D (Ergocalciferol) 1.25 MG (50000 UNIT) Caps capsule Commonly known as: DRISDOL Take 1 capsule (50,000 Units total) by mouth every 7 (seven) days. What changed: when to take this        Follow-up Information     Coletta Memos, MD Follow up.   Specialty: Neurosurgery Why: keep your scheduled appointment Contact information: [address] [place] [address] [phone]                 Signed: Coletta Memos 05/26/2023, 11:58 AM

## 2023-05-26 NOTE — Progress Notes (Signed)
RN went over discharge instructions with patient and spouse. Patient asked if she could get Oxy prescribed to take at home. Patient has been taking Oxy while admitted. RN reached out to on call neurosurgery and was instructed to reach out to whoever ordered the discharge. Dr. Coletta Memos was offline so RN reached out to [facility] at 12:21 pm. Patient and spouse were told we would have to wait for MD to reach back. Spouse was very upset about the wait and opted to leave without the prescription and said he would call the office himself to request it. Patient agreed and was then discharged from [facility].

## 2023-05-26 NOTE — Discharge Instructions (Signed)
Anterior Cervical Fusion Care After Pinching of the nerves is a common cause of long-term pain. When this happens, a procedure called an anterior cervical fusion is sometimes performed. It relieves the pressure on the pinched nerve roots or spinal cord in the neck. An anterior cervical fusion means that the operation is done through the front (anterior) of your neck to fuse bones in your neck together. This procedure is done to relieve the pressure on pinched nerve roots or spinal cord. This operation is done to control the movement of your spine, which may be pressing on the nerves. This may relieve the pain. The procedure that stops the movement of the spine is called a fusion. The cut by the surgeon (incision) is usually within a skin fold line under your chin. After moving the neck muscles gently apart, the neurosurgeon uses an operating microscope and removes the injured intervertebral disk (the cushion or pad of tissue between the bones of the spine). This takes the pressure off the nerves or spinal cord. This is called decompression. The area where the disc was removed is then filled with a bone graft. The graft will fuse the vertebrae together over time. This means it causes the vertebral bodies to grow together. The bone graft may be obtained from your own bone (your hip for example), or may be obtained from a bone bank. Receiving bone from a bone bank is similar to a blood bank, only the bone comes from human donors who have recently died. This type of graft is referred to as allograft bone. The preformed bone plug is safe and will not be rejected by your body. It does not contain blood cells. In some cases, the surgeon may use hardware in your neck to help stabilize it. This means that metal plates or pins or screws may be used to:  Provide extra support to the neck.   Help the bones to grow together more easily.  A cervical fusion procedure takes a couple hours to several hours, depending on  what needs to be done. Your caregiver will be able to answer your questions for you. HOME CARE INSTRUCTIONS   It will be normal to have a sore throat and have difficulty swallowing foods for a couple weeks following surgery. See your caregiver if this seems to be getting worse rather than better.   You may resume normal diet and activities as directed or allowed. Generally, walking and stair climbing are fine. Avoid lifting more than ten pounds and do no lifting above your head.   If given a cervical collar, remove only for bathing and eating, or as directed.   Use only showers for cleaning up, with no bathing, until seen.   You may apply ice to the surgical or bone donor site for 15 to 20 minutes each hour while awake for the first couple days following surgery. Put the ice in a plastic bag and place a towel between the bag of ice and your skin.   Change dressings if necessary or as directed.   You may drive in 10 days   Take prescribed medication as directed. Only take over-the-counter or prescription medicines for pain, discomfort, or fever as directed by your caregiver.   Make an appointment to see your caregiver for suture or staple removal when instructed.   If physical therapy was prescribed, follow your caregiver's directions.  SEEK IMMEDIATE MEDICAL CARE IF:  There is redness, swelling, or increasing pain in the wound.   There is  pus coming from the wound.   An unexplained oral temperature over 102 F (38.9 C) develops.   There is a bad smell coming from the wound or dressing.   You have swelling in your calf or leg.   You develop shortness of breath or chest pain.   The wound edges break open after sutures or staples have been removed.   Your pain is not controlled with medicine.   You seem to be getting worse rather than better.  Document Released: 05/06/2004 Document Revised: 06/04/2011 Document Reviewed: 07/12/2008 [facility] Patient Information 2012 [place].

## 2023-05-26 NOTE — Plan of Care (Signed)
  Problem: Education: Goal: Knowledge of General Education information will improve Description: Including pain rating scale, medication(s)/side effects and non-pharmacologic comfort measures Outcome: Progressing   Problem: Health Behavior/Discharge Planning: Goal: Ability to manage health-related needs will improve Outcome: Progressing   Problem: Clinical Measurements: Goal: Will remain free from infection Outcome: Progressing Goal: Diagnostic test results will improve Outcome: Progressing Goal: Respiratory complications will improve Outcome: Progressing Goal: Cardiovascular complication will be avoided Outcome: Progressing   Problem: Activity: Goal: Risk for activity intolerance will decrease Outcome: Progressing   Problem: Nutrition: Goal: Adequate nutrition will be maintained Outcome: Progressing   Problem: Coping: Goal: Level of anxiety will decrease Outcome: Progressing   Problem: Elimination: Goal: Will not experience complications related to bowel motility Outcome: Progressing Goal: Will not experience complications related to urinary retention Outcome: Progressing   Problem: Pain Managment: Goal: General experience of comfort will improve Outcome: Progressing   Problem: Safety: Goal: Ability to remain free from injury will improve Outcome: Progressing   Problem: Skin Integrity: Goal: Risk for impaired skin integrity will decrease Outcome: Progressing   Problem: Education: Goal: Ability to verbalize activity precautions or restrictions will improve Outcome: Progressing Goal: Knowledge of the prescribed therapeutic regimen will improve Outcome: Progressing Goal: Understanding of discharge needs will improve Outcome: Progressing   Problem: Activity: Goal: Ability to avoid complications of mobility impairment will improve Outcome: Progressing Goal: Ability to tolerate increased activity will improve Outcome: Progressing Goal: Will remain free from  falls Outcome: Progressing   Problem: Bowel/Gastric: Goal: Gastrointestinal status for postoperative course will improve Outcome: Progressing   Problem: Clinical Measurements: Goal: Ability to maintain clinical measurements within normal limits will improve Outcome: Progressing Goal: Postoperative complications will be avoided or minimized Outcome: Progressing Goal: Diagnostic test results will improve Outcome: Progressing   Problem: Pain Management: Goal: Pain level will decrease Outcome: Progressing   Problem: Skin Integrity: Goal: Will show signs of wound healing Outcome: Progressing   Problem: Health Behavior/Discharge Planning: Goal: Identification of resources available to assist in meeting health care needs will improve Outcome: Progressing   Problem: Bladder/Genitourinary: Goal: Urinary functional status for postoperative course will improve Outcome: Progressing

## 2023-05-27 ENCOUNTER — Telehealth: Payer: Self-pay

## 2023-05-27 ENCOUNTER — Encounter (HOSPITAL_COMMUNITY): Payer: Self-pay | Admitting: Neurosurgery

## 2023-05-27 NOTE — Transitions of Care (Post Inpatient/ED Visit) (Signed)
   05/27/2023  Name: VENNESA PATTY MRN: [id number] DOB: Aug 06, 1961  Today's TOC FU Call Status: Today's TOC FU Call Status:: Unsuccessful Call (1st Attempt) Unsuccessful Call (1st Attempt) Date: 05/27/23  Attempted to reach the patient regarding the most recent Inpatient/ED visit.  Follow Up Plan: Additional outreach attempts will be made to reach the patient to complete the Transitions of Care (Post Inpatient/ED visit) call.   Signature  Alyse Low, RN, BA, [facility], [facility] Health Care Management Coordinator Ph # [phone]

## 2023-05-28 ENCOUNTER — Telehealth: Payer: Self-pay

## 2023-05-28 ENCOUNTER — Other Ambulatory Visit: Payer: Medicaid Other

## 2023-05-28 NOTE — Transitions of Care (Post Inpatient/ED Visit) (Signed)
05/28/2023  Name: Daisy [name] MRN: [id number] DOB: 02-13-1961  Today's TOC FU Call Status: Today's TOC FU Call Status:: Successful TOC FU Call Completed TOC FU Call Complete Date: 05/28/23  Transition Care Management Follow-up Telephone Call Date of Discharge: 05/26/23 Discharge Facility: [facility]) Type of Discharge: Inpatient Admission Primary Inpatient Discharge Diagnosis:: s/p Anterior Cervical Discectomy Fusion (ACDF) C4-C5, C5-C6, C6-C7 How have you been since you were released from the hospital?: Same Any questions or concerns?: Yes (Regarding use of pain med oxycodone to manage her 8/10 post-op neck pain) Patient Questions/Concerns Addressed: Other: (We discussed that patient is going to receive 7 days worth of oxycodone for post op pain management, husband is picking it up for her after he finishes his work day)  Items Reviewed: Did you receive and understand the discharge instructions provided?: Yes Medications obtained,verified, and reconciled?: Yes (Medications Reviewed) Any new allergies since your discharge?: No Dietary orders reviewed?: NA Do you have support at home?: Yes People in Home: spouse Name of Support/Comfort Primary Source: "my husband, but I also can rely on my sister-in-law and mother-in-law"  Medications Reviewed Today: Medications Reviewed Today     Reviewed by Marcos Eke, RN (Registered Nurse) on 05/28/23 at 1538  Med List Status: <None>   Medication Order Taking? Sig Documenting Provider Last Dose Status Informant  escitalopram (LEXAPRO) 10 MG tablet [id number] Yes Take 1 tablet by mouth once daily  Patient taking differently: Take 10 mg by mouth at bedtime.   Elsie Lincoln, MD Taking Active Self  lamoTRIgine (LAMICTAL) 100 MG tablet [id number]  Take 1 tablet (100 mg total) by mouth at bedtime. [place]raini, Shawn Route  Active   methocarbamol (ROBAXIN) 500 MG tablet [id number] Yes Take 500 mg by mouth 2 (two) times daily as needed for  muscle spasms. [provider] Taking Active Self  naloxone [facility]) nasal spray 4 mg/0.1 mL [id number]  Place 1 spray into the nose as needed (opioid reversal). [provider]  Active Self           Med Note Earlene Plater, Rolm Gala   Fri May 22, 2023 11:25 AM) Never used per patient  oxyCODONE-acetaminophen (PERCOCET/ROXICET) 5-325 MG tablet [id number] Yes Take 1 tablet by mouth in the morning, at noon, and at bedtime. [provider] Taking Active Self  tiZANidine (ZANAFLEX) 4 MG tablet [id number] Yes Take 1 tablet (4 mg total) by mouth every 8 (eight) hours as needed for muscle spasms. Coletta Memos, MD Taking Active   Vitamin D, Ergocalciferol, (DRISDOL) 1.25 MG (50000 UNIT) CAPS capsule [id number] Yes Take 1 capsule (50,000 Units total) by mouth every 7 (seven) days.  Patient taking differently: Take 50,000 Units by mouth every Saturday.   Babs Sciara, MD Taking Active Self            Home Care and Equipment/Supplies: Were Home Health Services Ordered?: No Any new equipment or medical supplies ordered?: No  Functional Questionnaire: Do you need assistance with bathing/showering or dressing?: No Do you need assistance with meal preparation?: No Do you need assistance with eating?: No Do you have difficulty maintaining continence: No Do you need assistance with getting out of bed/getting out of a chair/moving?: No Do you have difficulty managing or taking your medications?: No  Follow up appointments reviewed: PCP Follow-up appointment confirmed?: Yes Date of PCP follow-up appointment?: 06/05/23 Follow-up Provider: Dr. Gerda Diss Specialist [facility] Follow-up appointment confirmed?: Yes Date of Specialist follow-up appointment?: 06/01/23 Follow-Up Specialty Provider:: Healthcare Provider Ronaldo Miyamoto  [place], [facility] Do you need transportation to your follow-up appointment?: No Do you understand care options if your condition(s) worsen?:  Yes-patient verbalized understanding  Thank you, [name], for allowing me to participate in your care. I look forward to speaking to you again next Tuesday 8/27 after you have been seen on 8/26 by your Surgeon's office for your post-op visit [facility]). I am glad that you are finally going to receive your oxycodone RX today for pain management control.  Our #1 Care Plan goal right now is to have your postop pain managed to the pain level you feel comfortable with (0-3 on the 0-10 pain score) within the next 24 hrs. Please take your RX Oxycodone as ordered to stay ahead of the pain.  Looking forward to talking to you again,  Franki Monte, RN, BA, [facility], CRRN [facility] Health Care Management Coordinator Ph # [phone]

## 2023-05-28 NOTE — Transitions of Care (Post Inpatient/ED Visit) (Signed)
05/28/2023  Name: Daisy [name] MRN: [id number] DOB: 1960-11-26  Today's TOC FU Call Status: Today's TOC FU Call Status:: Successful TOC FU Call Completed TOC FU Call Complete Date: 05/28/23  Transition Care Management Follow-up Telephone Call Date of Discharge: 05/26/23 Discharge Facility: [facility]) Type of Discharge: Inpatient Admission Primary Inpatient Discharge Diagnosis:: s/p Anterior Cervical Discectomy Fusion C4-C5 C5-C6, C6-C7 How have you been since you were released from the hospital?: Same Any questions or concerns?: Yes Patient Questions/Concerns:: main concern is obtaining her oxycodone for uncontrolled post surgical pain Patient Questions/Concerns Addressed: Other: (has been working with her PCP's office, [employer], and Pharmacy to resolve issue in obtaining adequate supply of her oxycodone RX for post-operative pain)  Items Reviewed: Did you receive and understand the discharge instructions provided?: Yes Medications obtained,verified, and reconciled?: Partial Review Completed Reason for Partial Mediation Review: Pt awaiting call from Provider re pain medicine RX Any new allergies since your discharge?: No  Medications Reviewed Today: Medications Reviewed Today     Reviewed by Marcos Eke, RN (Registered Nurse) on 05/28/23 at 1052  Med List Status: <None>   Medication Order Taking? Sig Documenting Provider Last Dose Status Informant  escitalopram (LEXAPRO) 10 MG tablet [id number] No Take 1 tablet by mouth once daily  Patient taking differently: Take 10 mg by mouth at bedtime.   Elsie Lincoln, MD 05/21/2023 Active Self  lamoTRIgine (LAMICTAL) 100 MG tablet [id number] No Take 1 tablet (100 mg total) by mouth at bedtime. Daine Gip 05/21/2023 Active   methocarbamol (ROBAXIN) 500 MG tablet [id number] No Take 500 mg by mouth 2 (two) times daily as needed for muscle spasms. [provider] 05/21/2023 Active Self  naloxone [facility])  nasal spray 4 mg/0.1 mL [id number]  Place 1 spray into the nose as needed (opioid reversal). [provider]  Active Self           Med Note Earlene Plater, Rolm Gala   Fri May 22, 2023 11:25 AM) Never used per patient  oxyCODONE-acetaminophen (PERCOCET/ROXICET) 5-325 MG tablet [id number] No Take 1 tablet by mouth in the morning, at noon, and at bedtime. [provider] 05/22/2023 0700 Active Self  tiZANidine (ZANAFLEX) 4 MG tablet [id number]  Take 1 tablet (4 mg total) by mouth every 8 (eight) hours as needed for muscle spasms. Coletta Memos, MD  Active   Vitamin D, Ergocalciferol, (DRISDOL) 1.25 MG (50000 UNIT) CAPS capsule [id number] No Take 1 capsule (50,000 Units total) by mouth every 7 (seven) days.  Patient taking differently: Take 50,000 Units by mouth every Saturday.   Babs Sciara, MD 05/09/2023 Active Self            Home Care and Equipment/Supplies:    Functional Questionnaire:    Follow up appointments reviewed: PCP Follow-up appointment confirmed?: Yes Date of PCP follow-up appointment?: 06/05/23 Follow-up Provider: Dr. Gerda Diss, [facility] Family Medicine [facility] Follow-up appointment confirmed?: No Follow-Up Specialty Provider:: Aware she needs to make follow up appt with Coletta Memos, [facility] Reason Specialist Follow-Up Not Confirmed: Patient has Specialist Provider Number and will Call for Appointment Do you need transportation to your follow-up appointment?: No Do you understand care options if your condition(s) worsen?: Yes-patient verbalized understanding  Thank you Ms. Bustle for taking time to speak with me today regarding Transition of Care needs following your hospitalization for cervical spine surgery. I understand that you are in contact with your PCP, Health Insurance provider, and Pharmacy to resolve  issues around obtaining your post op pain med. I will follow up with you as promised either this  afternoon if you are available, or tomorrow, 8/23 am to discuss post op and pain management challenges.  Warm Regards, Franki Monte, RN, BA, [facility], [facility] Health Care Management Coordinator Ph # [phone], Transition of Care

## 2023-06-01 DIAGNOSIS — M47892 Other spondylosis, cervical region: Secondary | ICD-10-CM | POA: Diagnosis not present

## 2023-06-02 ENCOUNTER — Other Ambulatory Visit: Payer: Medicaid Other

## 2023-06-02 ENCOUNTER — Telehealth: Payer: Self-pay

## 2023-06-02 NOTE — Patient Outreach (Signed)
  Care Management  Transitions of Care Program Transitions of Care Post-discharge week 2   06/02/2023 Name: Daisy [name] MRN: [id number] DOB: 1961/06/02  Subjective: Daisy [name] is a 62 y.o. year old female who is a primary care patient of Luking, Jonna Coup, MD. The Care Management team Engaged with patient Engaged with patient by telephone to assess and address transitions of care needs.   Consent to Services:  Patient was given information about care management services, agreed to services, and gave verbal consent to participate.   Assessment:           SDOH Interventions    Flowsheet Row Office Visit from 10/16/2022 in [facility] Office Visit from 09/18/2022 in [facility]  SDOH Interventions    Depression Interventions/Treatment  Medication Medication, Counseling        Goals Addressed             This Visit's Progress    Transition of Care       Current Barriers:  Knowledge Deficits related to plan of care for management of Chronic Pain   RNCM Clinical Goal(s):  Patient will verbalize understanding of plan for management of chronic neck pain as evidenced by patient ability to utilize pain management plan and report outcome  through collaboration with RN Care manager, provider, and care team.   Interventions: Evaluation of current treatment plan related to  self management and patient's adherence to plan as established by provider   Pain:  (Status: New goal.) Short Term Goal  Pain assessment performed Medications reviewed Reviewed provider established plan for pain management; Discussed importance of adherence to all scheduled medical appointments; Counseled on the importance of reporting any/all new or changed pain symptoms or management strategies to pain management provider; Discussed use of relaxation techniques and/or diversional activities to assist with pain reduction (distraction, imagery,  relaxation, massage, acupressure, TENS, heat, and cold application;  Patient Goals/Self-Care Activities: Take medications as prescribed   Attend all scheduled provider appointments Call pharmacy for medication refills 3-7 days in advance of running out of medications Call provider office for new concerns or questions           Plan: Telephone follow up appointment with care management team member scheduled for: 06/09/23 @ 10am  Alyse Low, RN, BA, [facility], CRRN [facility] Care Management Coordinator, Transition of Care Ph # [phone]

## 2023-06-05 ENCOUNTER — Ambulatory Visit: Payer: Medicaid Other | Admitting: Family Medicine

## 2023-06-09 ENCOUNTER — Telehealth: Payer: Self-pay

## 2023-06-09 ENCOUNTER — Other Ambulatory Visit: Payer: Medicaid Other

## 2023-06-09 NOTE — Patient Outreach (Signed)
  Care Management  Transitions of Care Program Managed Medicaid Transitions of Care week 3  06/09/2023 Name: Daisy [name] MRN: [id number] DOB: [birth year]  Subjective: Daisy [name] is a 62 y.o. year old female who is a primary care patient of Luking, Jonna Coup, MD. The Care Management team was unable to reach the patient by phone to assess and address transitions of care needs.   Plan: Additional outreach attempts will be made to reach the patient enrolled in the [facility] Program (Post Inpatient/ED Visit).  Alyse Low, RN, BA, [facility], CRRN [facility] Coordinator, Transition of Care Ph # [phone]

## 2023-06-11 ENCOUNTER — Telehealth: Payer: Self-pay

## 2023-06-11 ENCOUNTER — Other Ambulatory Visit: Payer: Medicaid Other

## 2023-06-11 NOTE — Patient Outreach (Signed)
  Care Management  Transitions of Care Program Managed Medicaid Transitions of Care program completion   06/11/2023 Name: Daisy [name] MRN: [id number] DOB: 1960/10/27  Subjective: Daisy [name] is a 62 y.o. year old female who is a primary care patient of Luking, Jonna Coup, MD. The Care Management team Engaged with patient Engaged with patient by telephone to assess and address transitions of care needs.   Consent to Services:  Patient was given information about Managed Medicaid Care Management services, agreed to services, and gave verbal consent to participate.   Assessment:           SDOH Interventions    Flowsheet Row Office Visit from 10/16/2022 in [facility] Office Visit from 09/18/2022 in [facility]  SDOH Interventions    Depression Interventions/Treatment  Medication Medication, Counseling        Goals Addressed             This Visit's Progress    COMPLETED: Transition of Care       Current Barriers:  Knowledge Deficits related to plan of care for management of Chronic Pain   RNCM Clinical Goal(s):  Patient will verbalize understanding of plan for management of chronic neck pain as evidenced by patient ability to utilize pain management plan and report outcome  through collaboration with RN Care manager, provider, and care team.   Interventions: Evaluation of current treatment plan related to  self management and patient's adherence to plan as established by provider   Pain:  (Status: New goal.) Short Term Goal  Pain assessment performed Medications reviewed Reviewed provider established plan for pain management; Discussed importance of adherence to all scheduled medical appointments; Counseled on the importance of reporting any/all new or changed pain symptoms or management strategies to pain management provider; Discussed use of relaxation techniques and/or diversional activities to assist with pain  reduction (distraction, imagery, relaxation, massage, acupressure, TENS, heat, and cold application;  Patient Goals/Self-Care Activities: Take medications as prescribed   Attend all scheduled provider appointments Call pharmacy for medication refills 3-7 days in advance of running out of medications Call provider office for new concerns or questions           Plan: The patient has been provided with contact information for the care management team and has been advised to call with any health related questions or concerns.   Alyse Low, RN, BA, [facility], CRRN [facility] Coordinator, Transition of Care Ph # [phone]

## 2023-06-11 NOTE — Patient Instructions (Signed)
Visit Information  Dear Daisy [name],  Thank you for taking time to visit with me today. I'm glad your neck pain has lessened and that you are feeling much better since you underwent surgery (Anterior Cervical Discectomy Fusion C4-C5, C5-C6, C6-C7) for spinal stenosis on 8/16. Please don't hesitate to contact me if I can be of assistance to you before our next scheduled telephone appointment.   Following is a copy of your care plan:   Goals Addressed             This Visit's Progress    COMPLETED: Transition of Care       Current Barriers:  Knowledge Deficits related to plan of care for management of Chronic Pain   RNCM Clinical Goal(s):  Patient will verbalize understanding of plan for management of chronic neck pain as evidenced by patient ability to utilize pain management plan and report outcome  through collaboration with RN Care manager, provider, and care team.   Interventions: Evaluation of current treatment plan related to  self management and patient's adherence to plan as established by provider   Pain:  (Status: New goal.) Short Term Goal  Pain assessment performed Medications reviewed Reviewed provider established plan for pain management; Discussed importance of adherence to all scheduled medical appointments; Counseled on the importance of reporting any/all new or changed pain symptoms or management strategies to pain management provider; Discussed use of relaxation techniques and/or diversional activities to assist with pain reduction (distraction, imagery, relaxation, massage, acupressure, TENS, heat, and cold application;  Patient Goals/Self-Care Activities: Take medications as prescribed   Attend all scheduled provider appointments Call pharmacy for medication refills 3-7 days in advance of running out of medications Call provider office for new concerns or questions           Patient verbalizes understanding of instructions and care plan provided today and  agrees to view in MyChart. Active MyChart status and patient understanding of how to access instructions and care plan via MyChart confirmed with patient.     The patient has been provided with contact information for the care management team and has been advised to call with any health related questions or concerns.   Please call the care guide team at [phone] if you need to cancel or reschedule your appointment.   Please call 1-800-273-TALK (toll free, 24 hour hotline) if you are experiencing a Mental Health or Behavioral Health Crisis or need someone to talk to.  Warm regards,  Franki Monte, RN, BA, [facility], CRRN [facility] Coordinator, Transition of Care Ph # [phone]

## 2023-06-16 ENCOUNTER — Other Ambulatory Visit: Payer: Medicaid Other

## 2023-06-24 DIAGNOSIS — Z79899 Other long term (current) drug therapy: Secondary | ICD-10-CM | POA: Diagnosis not present

## 2023-06-26 DIAGNOSIS — Z79899 Other long term (current) drug therapy: Secondary | ICD-10-CM | POA: Diagnosis not present

## 2023-07-01 ENCOUNTER — Encounter: Payer: Self-pay | Admitting: Nutrition

## 2023-07-01 ENCOUNTER — Encounter: Payer: Medicaid Other | Attending: Family Medicine | Admitting: Nutrition

## 2023-07-01 VITALS — Ht 63.0 in | Wt 194.2 lb

## 2023-07-01 DIAGNOSIS — E669 Obesity, unspecified: Secondary | ICD-10-CM | POA: Diagnosis not present

## 2023-07-01 DIAGNOSIS — N183 Chronic kidney disease, stage 3 unspecified: Secondary | ICD-10-CM | POA: Insufficient documentation

## 2023-07-01 DIAGNOSIS — R5383 Other fatigue: Secondary | ICD-10-CM | POA: Diagnosis not present

## 2023-07-01 DIAGNOSIS — F32A Depression, unspecified: Secondary | ICD-10-CM | POA: Insufficient documentation

## 2023-07-01 DIAGNOSIS — Z713 Dietary counseling and surveillance: Secondary | ICD-10-CM | POA: Diagnosis not present

## 2023-07-01 DIAGNOSIS — N1831 Chronic kidney disease, stage 3a: Secondary | ICD-10-CM

## 2023-07-01 DIAGNOSIS — E663 Overweight: Secondary | ICD-10-CM

## 2023-07-01 DIAGNOSIS — F17210 Nicotine dependence, cigarettes, uncomplicated: Secondary | ICD-10-CM

## 2023-07-01 NOTE — Progress Notes (Signed)
Medical Nutrition Therapy  Appointment Start time:  0800  Appointment End time:  0900  Primary concerns today: CKD and Obesity  Referral diagnosis: E66.3, N18.3 Preferred learning style: NO Preference  Learning readiness: Ready  NUTRITION ASSESSMENT  62 yr old wfemale referred for obesity and CKD PCP Dr. Gerda Diss.  Therapist Dr. Adrian Blackwater. Reports she needs to get back to seeing therapist for her depression, anxiety and PTSD. Diet is high in sugar from sweet tea and nicotine from smoking. Eats 1 meal per day. Not exercising. Sugar intake is contributing to her excessive weight gain. Smoking effecting her CKD, BP and heart health. She notes she is trying to cut back. History of elevated fasting blood sugars per records and possible Pre DM or Type 2 DM. No A1C available.  Currently drinking 2+ cups of sugar in her sweet tea daily. She has gained 14 lbs in the last 6 weeks.  Recently had surgery on her neck/spine. Her sugar and nicotine intake is impacting her mental health due to their addicting properties and inflammatory impact.  She is willing to work on Lifestyle Medicine with a whole plant based diet and working through the 6 pillars of health to improve her weight, reverse her CKD, prevent DM and complications from Obesity.Marland Kitchen No A1C or lipid profile for review.  Clinical Medical Hx:  Past Medical History:  Diagnosis Date   Acute renal failure superimposed on stage 3a chronic kidney disease (HCC) 03/30/2021   Anxiety    Bipolar 1 disorder (HCC)    Chronic pain syndrome    Depression    Fatigue    Fibromyalgia    Kidney disease    Stage 3   Normal cardiac stress test 10/06/2000   Plantar fasciitis    PTSD (post-traumatic stress disorder)    Sepsis due to undetermined organism (HCC) 12/17/2021   SIRS (systemic inflammatory response syndrome) (HCC) 03/30/2021   Thyroid disease     Medications:  Current Outpatient Medications on File Prior to Visit  Medication Sig Dispense  Refill   escitalopram (LEXAPRO) 10 MG tablet Take 1 tablet by mouth once daily (Patient taking differently: Take 10 mg by mouth at bedtime.) 30 tablet 2   lamoTRIgine (LAMICTAL) 100 MG tablet Take 1 tablet (100 mg total) by mouth at bedtime. 30 tablet 0   methocarbamol (ROBAXIN) 500 MG tablet Take 500 mg by mouth 2 (two) times daily as needed for muscle spasms.     naloxone (NARCAN) nasal spray 4 mg/0.1 mL Place 1 spray into the nose as needed (opioid reversal).     oxyCODONE-acetaminophen (PERCOCET/ROXICET) 5-325 MG tablet Take 1 tablet by mouth in the morning, at noon, and at bedtime.     tiZANidine (ZANAFLEX) 4 MG tablet Take 1 tablet (4 mg total) by mouth every 8 (eight) hours as needed for muscle spasms. 60 tablet 0   Vitamin D, Ergocalciferol, (DRISDOL) 1.25 MG (50000 UNIT) CAPS capsule Take 1 capsule (50,000 Units total) by mouth every 7 (seven) days. (Patient taking differently: Take 50,000 Units by mouth every Saturday.) 4 capsule 2   No current facility-administered medications on file prior to visit.   . Labs:     Latest Ref Rng & Units 05/22/2023    8:34 PM 05/12/2023    9:00 AM 03/06/2023    9:23 AM  CMP  Glucose 70 - 99 mg/dL  696  295   BUN 8 - 23 mg/dL  26  23   Creatinine 2.84 - 1.00 mg/dL 1.32  4.40  1.26   Sodium 135 - 145 mmol/L  135  139   Potassium 3.5 - 5.1 mmol/L  4.3  5.1   Chloride 98 - 111 mmol/L  105  103   CO2 22 - 32 mmol/L  21  22   Calcium 8.9 - 10.3 mg/dL  8.9    Total Protein 6.0 - 8.5 g/dL   7.0   Total Bilirubin 0.0 - 1.2 mg/dL   <7.8   Alkaline Phos 44 - 121 IU/L   82   AST 0 - 40 IU/L   16   ALT 0 - 32 IU/L   14    Lipid Panel  No results found for: "CHOL", "TRIG", "HDL", "CHOLHDL", "VLDL", "LDLCALC", "LDLDIRECT", "LABVLDL" No results found for: "HGBA1C"  Notable Signs/Symptoms: fatigue, depressed feelings,   Lifestyle & Dietary Hx Married. Not working Eats most meals at home. Drinks a gallon of tea a day; 2 cups of sugar in each gallon of  tea. Estimated daily fluid intake: 120 oz Supplements:  Sleep: poor since neck surgery Stress / self-care: some stress Current average weekly physical activity: ADL since surgery  24-Hr Dietary Recall Eats  1 meal per day; usually dinner Drink a gallon of sweet tea a day   Estimated Energy Needs Calories: 1200 Carbohydrate: 135g Protein: 90g Fat: 33g   NUTRITION DIAGNOSIS  NI-1.7 Predicted excessive energy intake As related to High calorie diet from sugar and processed foods.  As evidenced by Obesity, CKD and 14 lbs weight gain in the last 6 weeks..   NUTRITION INTERVENTION  Nutrition education (E-1) on the following topics:  Lifestyle Medicine  - Whole Food, Plant Predominant Nutrition is highly recommended: Eat Plenty of vegetables, Mushrooms, fruits, Legumes, Whole Grains, Nuts, seeds in lieu of processed meats, processed snacks/pastries red meat, poultry, eggs.    -It is better to avoid simple carbohydrates including: Cakes, Sweet Desserts, Ice Cream, Soda (diet and regular), Sweet Tea, Candies, Chips, Cookies, Store Bought Juices, Alcohol in Excess of  1-2 drinks a day, Lemonade,  Artificial Sweeteners, Doughnuts, Coffee Creamers, "Sugar-free" Products, etc, etc.  This is not a complete list.....  Exercise: If you are able: 30 -60 minutes a day ,4 days a week, or 150 minutes a week.  The longer the better.  Combine stretch, strength, and aerobic activities.  If you were told in the past that you have high risk for cardiovascular diseases, you may seek evaluation by your heart doctor prior to initiating moderate to intense exercise programs.   Handouts Provided Include  Lifestyle Medicine handouts  Learning Style & Readiness for Change Teaching method utilized: Visual & Auditory  Demonstrated degree of understanding via: Teach Back  Barriers to learning/adherence to lifestyle change: none  Goals Established by Pt Goals  Reduce sugar in sweet tea to 1 cup per  gallon Increase walking 30 minutes a day Drink 3 bottles of water per day Put water next to bed instead of tea Eat 3 meals per day Lose 1 lb per week.   MONITORING & EVALUATION Dietary intake, weekly physical activity, and weight  in 1-2 months.  Recommend to check a lipid profile and A1c due to being high risk for hyperlipidemia and Type 2 DM. Next Steps  Patient is to work on meal planning, meal prepping and exercise and cutting out sweet tea.Marland Kitchen

## 2023-07-01 NOTE — Patient Instructions (Addendum)
Goals  Reduce sugar in sweet tea to 1 cup per gallon Increase walking 30 minutes a day Drink 3 bottles of water per day Put water next to bed instead of tea Eat 3 meals per day Lose 1 lb per week.

## 2023-07-07 ENCOUNTER — Encounter: Payer: Self-pay | Admitting: Family Medicine

## 2023-07-07 ENCOUNTER — Ambulatory Visit (INDEPENDENT_AMBULATORY_CARE_PROVIDER_SITE_OTHER): Payer: Medicaid Other | Admitting: Family Medicine

## 2023-07-07 VITALS — BP 138/88 | HR 95 | Wt 192.6 lb

## 2023-07-07 DIAGNOSIS — R5383 Other fatigue: Secondary | ICD-10-CM

## 2023-07-07 DIAGNOSIS — Z23 Encounter for immunization: Secondary | ICD-10-CM

## 2023-07-07 DIAGNOSIS — R739 Hyperglycemia, unspecified: Secondary | ICD-10-CM | POA: Diagnosis not present

## 2023-07-07 DIAGNOSIS — Z13220 Encounter for screening for lipoid disorders: Secondary | ICD-10-CM

## 2023-07-07 DIAGNOSIS — I10 Essential (primary) hypertension: Secondary | ICD-10-CM | POA: Diagnosis not present

## 2023-07-07 DIAGNOSIS — R635 Abnormal weight gain: Secondary | ICD-10-CM

## 2023-07-07 DIAGNOSIS — D692 Other nonthrombocytopenic purpura: Secondary | ICD-10-CM

## 2023-07-07 DIAGNOSIS — Z1211 Encounter for screening for malignant neoplasm of colon: Secondary | ICD-10-CM

## 2023-07-07 DIAGNOSIS — D509 Iron deficiency anemia, unspecified: Secondary | ICD-10-CM | POA: Diagnosis not present

## 2023-07-07 MED ORDER — AMLODIPINE BESYLATE 2.5 MG PO TABS: 2.5000 mg | ORAL_TABLET | Freq: Every day | ORAL | 3 refills | Status: DC

## 2023-07-07 NOTE — Progress Notes (Signed)
Subjective:    Patient ID: Daisy [name], female    DOB: 1960-11-14, 62 y.o.   MRN: [id number]  Discussed the use of AI scribe software for clinical note transcription with the patient, who gave verbal consent to proceed.  History of Present Illness   The patient, with a history of neck surgery, reports persistent neck pain and soreness, although the numbness in the hands has resolved. The patient notes new onset of pain on the right side, particularly in the fingers, which was not present prior to the surgery. The pain is described as severe, and is somewhat alleviated by oxycodone.  The patient also reports chronic fatigue, with a significant decrease in energy levels. The patient has difficulty sleeping, often resorting to sleeping in a recliner for comfort due to neck stiffness. The patient also reports a history of anemia and thyroid issues, and is currently experiencing weight gain.  The patient has been experiencing issues with high blood pressure, which has been persistently elevated even during a recent hospital stay. The patient has a family history of hypertension and heart problems.  The patient also reports regular bowel movements, with no blood noted. The patient's mood has been fluctuating, but is reportedly improving. The patient is also seeing a nutritionist for weight management.         Review of Systems     Objective:    Physical Exam   VITALS: BP- 134/92 CHEST: Lungs clear to auscultation. CARDIOVASCULAR: Heart sounds normal. SKIN: Bruising on arms. Ankles without significant swelling.           Assessment & Plan:  Assessment and Plan    Post-operative neck pain Persistent right-sided neck pain and new onset right hand pain following neck surgery. Improvement in numbness in hands. Oxycodone provides some relief. Discussed potential use of gabapentin for neuropathic pain. -Continue Oxycodone as needed for pain. -Consider re-initiating Gabapentin if no  contraindications (patient to confirm with surgeon). -Follow-up with surgeon on 07/30/2023 for further evaluation and imaging.  Hypertension Elevated blood pressure readings, family history of hypertension and heart problems. -Initiate low-dose antihypertensive medication. -Recheck blood pressure in 4-6 weeks.  Fatigue and weight gain Persistent fatigue and weight gain. History of anemia and thyroid disease. -Order CBC, iron studies, thyroid function tests, A1c, and cholesterol level. -Encourage continued work with nutritionist for [employer] management.  General Health Maintenance -Administer influenza vaccine today. -Encourage patient to schedule mammogram. -Contact gastroenterology for colon cancer screening. -Check lab results and adjust treatment plan as necessary.      1. Primary hypertension Blood pressure borderline Healthier diet Low-dose amlodipine daily Follow-up within 3 to 4 months recheck blood pressure - Basic Metabolic Panel - Microalbumin/Creatinine Ratio, Urine  2. Hyperglycemia Glucose was high during her next surgery check A1c to rule out diabetes - Hemoglobin A1c  3. Screening for colon cancer Screening will send message to Dr. Queen Blossom group - Ambulatory referral to Gastroenterology  4. Senile purpura (HCC) Has senile purpura on her arms this is normal for age we will look at CBC platelets  5. Other fatigue Moderate fatigue and weight gain will check thyroid function - TSH + free T4  6. Iron deficiency anemia, unspecified iron deficiency anemia type History of iron deficiency along with significant fatigue lab work ordered - Iron, TIBC and Ferritin Panel - CBC with Differential  7. Weight gain Weight gain check thyroid function healthy diet recommended - TSH + free T4  8. Screening, lipid Screening lipid - Lipid Panel  9. Immunization due Immunization - Flu vaccine trivalent PF, 6mos and older(Flulaval,Afluria,Fluarix,Fluzone)

## 2023-07-08 ENCOUNTER — Encounter (INDEPENDENT_AMBULATORY_CARE_PROVIDER_SITE_OTHER): Payer: Self-pay | Admitting: *Deleted

## 2023-07-09 DIAGNOSIS — R635 Abnormal weight gain: Secondary | ICD-10-CM | POA: Diagnosis not present

## 2023-07-09 DIAGNOSIS — Z13220 Encounter for screening for lipoid disorders: Secondary | ICD-10-CM | POA: Diagnosis not present

## 2023-07-09 DIAGNOSIS — R5383 Other fatigue: Secondary | ICD-10-CM | POA: Diagnosis not present

## 2023-07-09 DIAGNOSIS — R739 Hyperglycemia, unspecified: Secondary | ICD-10-CM | POA: Diagnosis not present

## 2023-07-09 DIAGNOSIS — I10 Essential (primary) hypertension: Secondary | ICD-10-CM | POA: Diagnosis not present

## 2023-07-09 DIAGNOSIS — D509 Iron deficiency anemia, unspecified: Secondary | ICD-10-CM | POA: Diagnosis not present

## 2023-07-10 LAB — CBC WITH DIFFERENTIAL/PLATELET
Basophils Absolute: 0.1 10*3/uL (ref 0.0–0.2)
Basos: 1 %
EOS (ABSOLUTE): 0.2 10*3/uL (ref 0.0–0.4)
Eos: 2 %
Hematocrit: 43.6 % (ref 34.0–46.6)
Hemoglobin: 13.4 g/dL (ref 11.1–15.9)
Immature Grans (Abs): 0.1 10*3/uL (ref 0.0–0.1)
Immature Granulocytes: 1 %
Lymphocytes Absolute: 1.5 10*3/uL (ref 0.7–3.1)
Lymphs: 17 %
MCH: 25.7 pg — ABNORMAL LOW (ref 26.6–33.0)
MCHC: 30.7 g/dL — ABNORMAL LOW (ref 31.5–35.7)
MCV: 84 fL (ref 79–97)
Monocytes Absolute: 0.6 10*3/uL (ref 0.1–0.9)
Monocytes: 6 %
Neutrophils Absolute: 6.8 10*3/uL (ref 1.4–7.0)
Neutrophils: 73 %
Platelets: 343 10*3/uL (ref 150–450)
RBC: 5.21 x10E6/uL (ref 3.77–5.28)
RDW: 14.7 % (ref 11.7–15.4)
WBC: 9.2 10*3/uL (ref 3.4–10.8)

## 2023-07-10 LAB — HEMOGLOBIN A1C
Est. average glucose Bld gHb Est-mCnc: 126 mg/dL
Hgb A1c MFr Bld: 6 % — ABNORMAL HIGH (ref 4.8–5.6)

## 2023-07-10 LAB — IRON,TIBC AND FERRITIN PANEL
Ferritin: 25 ng/mL (ref 15–150)
Iron Saturation: 9 % — CL (ref 15–55)
Iron: 36 ug/dL (ref 27–139)
Total Iron Binding Capacity: 413 ug/dL (ref 250–450)
UIBC: 377 ug/dL — ABNORMAL HIGH (ref 118–369)

## 2023-07-10 LAB — BASIC METABOLIC PANEL
BUN/Creatinine Ratio: 14 (ref 12–28)
BUN: 17 mg/dL (ref 8–27)
CO2: 23 mmol/L (ref 20–29)
Calcium: 9.5 mg/dL (ref 8.7–10.3)
Chloride: 102 mmol/L (ref 96–106)
Creatinine, Ser: 1.23 mg/dL — ABNORMAL HIGH (ref 0.57–1.00)
Glucose: 141 mg/dL — ABNORMAL HIGH (ref 70–99)
Potassium: 4.5 mmol/L (ref 3.5–5.2)
Sodium: 140 mmol/L (ref 134–144)
eGFR: 50 mL/min/{1.73_m2} — ABNORMAL LOW (ref >59)

## 2023-07-10 LAB — TSH+FREE T4
Free T4: 0.92 ng/dL (ref 0.82–1.77)
TSH: 4.78 u[IU]/mL — ABNORMAL HIGH (ref 0.450–4.500)

## 2023-07-10 LAB — MICROALBUMIN / CREATININE URINE RATIO
Creatinine, Urine: 115.2 mg/dL
Microalb/Creat Ratio: 13 mg/g{creat} (ref 0–29)
Microalbumin, Urine: 15.5 ug/mL

## 2023-07-10 LAB — LIPID PANEL
Chol/HDL Ratio: 3.6 {ratio} (ref 0.0–4.4)
Cholesterol, Total: 171 mg/dL (ref 100–199)
HDL: 47 mg/dL (ref >39)
LDL Chol Calc (NIH): 99 mg/dL (ref 0–99)
Triglycerides: 143 mg/dL (ref 0–149)
VLDL Cholesterol Cal: 25 mg/dL (ref 5–40)

## 2023-07-13 ENCOUNTER — Other Ambulatory Visit: Payer: Self-pay

## 2023-07-13 DIAGNOSIS — D509 Iron deficiency anemia, unspecified: Secondary | ICD-10-CM

## 2023-07-24 DIAGNOSIS — Z79899 Other long term (current) drug therapy: Secondary | ICD-10-CM | POA: Diagnosis not present

## 2023-07-28 DIAGNOSIS — Z79899 Other long term (current) drug therapy: Secondary | ICD-10-CM | POA: Diagnosis not present

## 2023-08-03 ENCOUNTER — Inpatient Hospital Stay: Payer: Medicaid Other | Attending: Oncology | Admitting: Oncology

## 2023-08-03 ENCOUNTER — Inpatient Hospital Stay: Payer: Medicaid Other

## 2023-08-03 ENCOUNTER — Encounter: Payer: Self-pay | Admitting: Oncology

## 2023-08-03 VITALS — BP 138/72 | HR 89 | Temp 98.1°F | Resp 19 | Ht 63.0 in | Wt 191.8 lb

## 2023-08-03 DIAGNOSIS — D509 Iron deficiency anemia, unspecified: Secondary | ICD-10-CM | POA: Diagnosis present

## 2023-08-03 DIAGNOSIS — F17200 Nicotine dependence, unspecified, uncomplicated: Secondary | ICD-10-CM | POA: Diagnosis not present

## 2023-08-03 DIAGNOSIS — F17210 Nicotine dependence, cigarettes, uncomplicated: Secondary | ICD-10-CM | POA: Insufficient documentation

## 2023-08-03 DIAGNOSIS — IMO0001 Reserved for inherently not codable concepts without codable children: Secondary | ICD-10-CM | POA: Insufficient documentation

## 2023-08-03 MED ORDER — FERROUS SULFATE 325 (65 FE) MG PO TBEC: 325.0000 mg | DELAYED_RELEASE_TABLET | ORAL | 3 refills | Status: DC

## 2023-08-03 NOTE — Assessment & Plan Note (Signed)
Severe fatigue, hair loss, and occasional dizziness. Iron and ferritin levels are significantly low. No known source of blood loss, but occasional bloody stools reported. -Administer IV iron in two doses, one week apart.We discussed some of the risks, benefits, and alternatives of intravenous iron infusions. The patient is symptomatic from anemia and the iron level is critically low. She tolerated oral iron supplement poorly and desires to achieved higher levels of iron faster for adequate hematopoesis. Some of the side-effects to be expected including risks of infusion reactions, phlebitis, headaches, nausea and fatigue.  The patient is willing to proceed. Patient education material was dispensed. Goal is to keep ferritin level greater than 50 and resolution of anemia  -Prescribed oral iron supplements, to be taken every other day with orange juice. -Schedule colonoscopy to investigate potential source of blood loss. GI referral pending.  Repeat labs and RTC in 1 month

## 2023-08-03 NOTE — Patient Instructions (Signed)
VISIT SUMMARY:  During today's visit, we discussed your symptoms of fatigue, hair loss, and weight gain. We also reviewed your chronic conditions, including chronic kidney disease, hypertension, and PTSD. We addressed your recent leg swelling and occasional episodes of dizziness, shortness of breath, and heart pounding. We also talked about your smoking habits and efforts to quit.  YOUR PLAN:  -IRON DEFICIENCY ANEMIA: Iron deficiency anemia is a condition where your body lacks enough iron to produce healthy red blood cells, leading to symptoms like fatigue and hair loss. We will administer IV iron in two doses, one week apart, and prescribe oral iron supplements to be taken every other day with orange juice. A colonoscopy will be scheduled to investigate any potential source of blood loss.  -CHRONIC KIDNEY DISEASE: Chronic kidney disease is a long-term condition where the kidneys do not work as well as they should. Your creatinine levels are slightly elevated, and your GFR is at 50. We recommend a follow-up with your primary care provider in November to discuss management.   -TOBACCO USE: Smoking can have many harmful effects on your health. You have reduced your smoking from three packs a day to one pack a day. We encourage you to continue your efforts to quit smoking and offer assistance with nicotine replacement therapy if needed.  INSTRUCTIONS:  Please follow up in one month with labs to assess your response to the iron therapy. Additionally, schedule a colonoscopy to investigate any potential source of blood loss. Continue monitoring your leg swelling and report any changes. Follow up with your primary care provider in November to discuss the management of your chronic kidney disease.

## 2023-08-03 NOTE — Progress Notes (Signed)
[facility] at [facility] HEMATOLOGY NEW VISIT  Babs Sciara, MD  REASON FOR REFERRAL: Iron deficiency  HISTORY OF PRESENT ILLNESS: Daisy [name] 62 y.o. female referred for iron deficiency.  She has a past medical history of chronic kidney disease and hypertension. She reports  fatigue and hair loss. She reports significant weight gain and a lack of energy. She also mentions that her hair has been falling out to the point where she can no longer use ponytail holders. She has been feeling extremely tired and has no energy.The patient also reports occasional episodes of dizziness, shortness of breath, and heart pounding, which she describes as feeling like a panic attack. She also reports occasional loss of appetite.  She has a history of smoking, currently about a pack a day, down from three packs a day. She is trying to quit. Reports no alcohol use. No family history of cancer.   I have reviewed the past medical history, past surgical history, social history and family history with the patient   ALLERGIES:  has No Known Allergies.  MEDICATIONS:  Current Outpatient Medications  Medication Sig Dispense Refill   amLODipine (NORVASC) 2.5 MG tablet Take 1 tablet (2.5 mg total) by mouth daily. 30 tablet 3   escitalopram (LEXAPRO) 10 MG tablet Take 1 tablet by mouth once daily (Patient taking differently: Take 10 mg by mouth at bedtime.) 30 tablet 2   ferrous sulfate 325 (65 FE) MG EC tablet Take 1 tablet (325 mg total) by mouth every other day. 45 tablet 3   lamoTRIgine (LAMICTAL) 100 MG tablet Take 1 tablet (100 mg total) by mouth at bedtime. 30 tablet 0   methocarbamol (ROBAXIN) 500 MG tablet Take 500 mg by mouth 2 (two) times daily as needed for muscle spasms.     naloxone (NARCAN) nasal spray 4 mg/0.1 mL Place 1 spray into the nose as needed (opioid reversal).     oxyCODONE-acetaminophen (PERCOCET/ROXICET) 5-325 MG tablet Take 1 tablet by mouth in the morning,  at noon, and at bedtime.     tiZANidine (ZANAFLEX) 4 MG tablet Take 1 tablet (4 mg total) by mouth every 8 (eight) hours as needed for muscle spasms. 60 tablet 0   Vitamin D, Ergocalciferol, (DRISDOL) 1.25 MG (50000 UNIT) CAPS capsule Take 1 capsule (50,000 Units total) by mouth every 7 (seven) days. 4 capsule 2   oxyCODONE (OXY IR/ROXICODONE) 5 MG immediate release tablet Take 5 mg by mouth 3 (three) times daily.     No current facility-administered medications for this visit.     REVIEW OF SYSTEMS:   Constitutional: Denies fevers, chills or night sweats Eyes: Denies blurriness of vision Ears, nose, mouth, throat, and face: Denies mucositis or sore throat Respiratory: Denies cough, dyspnea or wheezes Cardiovascular: Denies palpitation, chest discomfort or lower extremity swelling Gastrointestinal:  Denies nausea, heartburn or change in bowel habits Skin: Denies abnormal skin rashes Lymphatics: Denies new lymphadenopathy or easy bruising Neurological:Denies numbness, tingling or new weaknesses Behavioral/Psych: Mood is stable, no new changes  All other systems were reviewed with the patient and are negative.  PHYSICAL EXAMINATION:   Vitals:   08/03/23 1004  BP: 138/72  Pulse: 89  Resp: 19  Temp: 98.1 F (36.7 C)  SpO2: 98%    GENERAL:alert, no distress and comfortable SKIN: skin color, texture, turgor are normal, no rashes or significant lesions LUNGS: clear to auscultation and percussion with normal breathing effort HEART: regular rate & rhythm and no murmurs  and no lower extremity edema ABDOMEN:abdomen soft, non-tender and normal bowel sounds Musculoskeletal:no cyanosis of digits and no clubbing  NEURO: alert & oriented x 3 with fluent speech  LABORATORY DATA:  I have reviewed the data as listed  Lab Results  Component Value Date   WBC 9.2 07/09/2023   NEUTROABS 6.8 07/09/2023   HGB 13.4 07/09/2023   HCT 43.6 07/09/2023   MCV 84 07/09/2023   PLT 343 07/09/2023       Component Value Date/Time   NA 140 07/09/2023 1026   K 4.5 07/09/2023 1026   CL 102 07/09/2023 1026   CO2 23 07/09/2023 1026   GLUCOSE 141 (H) 07/09/2023 1026   GLUCOSE 100 (H) 05/12/2023 0900   BUN 17 07/09/2023 1026   CREATININE 1.23 (H) 07/09/2023 1026   CALCIUM 9.5 07/09/2023 1026   PROT 7.0 03/06/2023 0923   ALBUMIN 4.1 03/06/2023 0923   AST 16 03/06/2023 0923   ALT 14 03/06/2023 0923   ALKPHOS 82 03/06/2023 0923   BILITOT <0.2 03/06/2023 0923   GFRNONAA 53 (L) 05/22/2023 2034   GFRAA 65 04/13/2019 1348       Chemistry      Component Value Date/Time   NA 140 07/09/2023 1026   K 4.5 07/09/2023 1026   CL 102 07/09/2023 1026   CO2 23 07/09/2023 1026   BUN 17 07/09/2023 1026   CREATININE 1.23 (H) 07/09/2023 1026      Component Value Date/Time   CALCIUM 9.5 07/09/2023 1026   ALKPHOS 82 03/06/2023 0923   AST 16 03/06/2023 0923   ALT 14 03/06/2023 0923   BILITOT <0.2 03/06/2023 0923     Lab Results  Component Value Date   IRON 36 07/09/2023   TIBC 413 07/09/2023   FERRITIN 25 07/09/2023     ASSESSMENT & PLAN:  Patient is a 62 year old female with past medical history of CKD and hypertension presenting for iron deficiency .  IDA (iron deficiency anemia) Severe fatigue, hair loss, and occasional dizziness. Iron and ferritin levels are significantly low. No known source of blood loss, but occasional bloody stools reported. -Administer IV iron in two doses, one week apart.We discussed some of the risks, benefits, and alternatives of intravenous iron infusions. The patient is symptomatic from anemia and the iron level is critically low. She tolerated oral iron supplement poorly and desires to achieved higher levels of iron faster for adequate hematopoesis. Some of the side-effects to be expected including risks of infusion reactions, phlebitis, headaches, nausea and fatigue.  The patient is willing to proceed. Patient education material was dispensed. Goal is to  keep ferritin level greater than 50 and resolution of anemia  -Prescribed oral iron supplements, to be taken every other day with orange juice. -Schedule colonoscopy to investigate potential source of blood loss. GI referral pending.  Repeat labs and RTC in 1 month   Smoking Current smoker, approximately one pack per day, down from three packs per day. -Encourage continued efforts to quit smoking, offer assistance with nicotine replacement therapy if needed.   Orders Placed This Encounter  Procedures   CBC with Differential/Platelet    Standing Status:   Future    Standing Expiration Date:   08/02/2024   Comprehensive metabolic panel    Standing Status:   Future    Standing Expiration Date:   08/02/2024   Ferritin    Standing Status:   Future    Standing Expiration Date:   08/02/2024   Iron and TIBC  Standing Status:   Future    Standing Expiration Date:   08/02/2024    The total time spent in the appointment was 30 minutes encounter with patients including review of chart and various tests results, discussions about plan of care and coordination of care plan   All questions were answered. The patient knows to call the clinic with any problems, questions or concerns. No barriers to learning was detected.   Cindie Crumbly, MD 10/28/202412:49 PM

## 2023-08-03 NOTE — Assessment & Plan Note (Signed)
Current smoker, approximately one pack per day, down from three packs per day. -Encourage continued efforts to quit smoking, offer assistance with nicotine replacement therapy if needed.

## 2023-08-06 ENCOUNTER — Inpatient Hospital Stay: Payer: Medicaid Other

## 2023-08-06 VITALS — BP 163/83 | HR 74 | Temp 97.3°F | Resp 18

## 2023-08-06 DIAGNOSIS — D509 Iron deficiency anemia, unspecified: Secondary | ICD-10-CM | POA: Diagnosis not present

## 2023-08-06 MED ORDER — SODIUM CHLORIDE 0.9 % IV SOLN: INTRAVENOUS | Status: DC

## 2023-08-06 MED ORDER — SODIUM CHLORIDE 0.9 % IV SOLN
400.0000 mg | Freq: Once | INTRAVENOUS | Status: AC
  Administered 2023-08-06: 400 mg via INTRAVENOUS
  Filled 2023-08-06: qty 400

## 2023-08-06 MED ORDER — SODIUM CHLORIDE 0.9% FLUSH
10.0000 mL | Freq: Two times a day (BID) | INTRAVENOUS | Status: DC
  Administered 2023-08-06: 10 mL via INTRAVENOUS

## 2023-08-06 NOTE — Patient Instructions (Signed)
Iron Sucrose Injection What is this medication? IRON SUCROSE (EYE ern SOO krose) treats low levels of iron (iron deficiency anemia) in people with kidney disease. Iron is a mineral that plays an important role in making red blood cells, which carry oxygen from your lungs to the rest of your body. This medicine may be used for other purposes; ask your health care provider or pharmacist if you have questions. COMMON BRAND NAME(S): Venofer What should I tell my care team before I take this medication? They need to know if you have any of these conditions: Anemia not caused by low iron levels Heart disease High levels of iron in the blood Kidney disease Liver disease An unusual or allergic reaction to iron, other medications, foods, dyes, or preservatives Pregnant or trying to get pregnant Breastfeeding How should I use this medication? This medication is for infusion into a vein. It is given in a hospital or clinic setting. Talk to your care team about the use of this medication in children. While this medication may be prescribed for children as young as 2 years for selected conditions, precautions do apply. Overdosage: If you think you have taken too much of this medicine contact a poison control center or emergency room at once. NOTE: This medicine is only for you. Do not share this medicine with others. What if I miss a dose? Keep appointments for follow-up doses. It is important not to miss your dose. Call your care team if you are unable to keep an appointment. What may interact with this medication? Do not take this medication with any of the following: Deferoxamine Dimercaprol Other iron products This medication may also interact with the following: Chloramphenicol Deferasirox This list may not describe all possible interactions. Give your health care provider a list of all the medicines, herbs, non-prescription drugs, or dietary supplements you use. Also tell them if you smoke,  drink alcohol, or use illegal drugs. Some items may interact with your medicine. What should I watch for while using this medication? Visit your care team regularly. Tell your care team if your symptoms do not start to get better or if they get worse. You may need blood work done while you are taking this medication. You may need to follow a special diet. Talk to your care team. Foods that contain iron include: whole grains/cereals, dried fruits, beans, or peas, leafy green vegetables, and organ meats (liver, kidney). What side effects may I notice from receiving this medication? Side effects that you should report to your care team as soon as possible: Allergic reactions--skin rash, itching, hives, swelling of the face, lips, tongue, or throat Low blood pressure--dizziness, feeling faint or lightheaded, blurry vision Shortness of breath Side effects that usually do not require medical attention (report to your care team if they continue or are bothersome): Flushing Headache Joint pain Muscle pain Nausea Pain, redness, or irritation at injection site This list may not describe all possible side effects. Call your doctor for medical advice about side effects. You may report side effects to FDA at 1-800-FDA-1088. Where should I keep my medication? This medication is given in a hospital or clinic. It will not be stored at home. NOTE: This sheet is a summary. It may not cover all possible information. If you have questions about this medicine, talk to your doctor, pharmacist, or health care provider.  2024 Elsevier/Gold Standard (2023-02-27 00:00:00)

## 2023-08-06 NOTE — Progress Notes (Signed)
Patient tolerated iron infusion with no complaints voiced.  Peripheral IV site clean and dry with good blood return noted before and after infusion.  Band aid applied.  Pt observed for 30 minutes post iron infusion with no complications. VSS with discharge and left in satisfactory condition with no s/s of distress noted. All follow ups as scheduled.   Daisy [name] [employer]

## 2023-08-10 ENCOUNTER — Ambulatory Visit: Payer: Medicaid Other | Admitting: Nutrition

## 2023-08-10 NOTE — Progress Notes (Unsigned)
No show

## 2023-08-14 ENCOUNTER — Inpatient Hospital Stay: Payer: Medicaid Other | Attending: Oncology

## 2023-08-14 VITALS — BP 155/76 | HR 71 | Temp 97.0°F | Resp 18 | Ht 63.0 in | Wt 189.1 lb

## 2023-08-14 DIAGNOSIS — Z79899 Other long term (current) drug therapy: Secondary | ICD-10-CM | POA: Insufficient documentation

## 2023-08-14 DIAGNOSIS — D509 Iron deficiency anemia, unspecified: Secondary | ICD-10-CM | POA: Diagnosis present

## 2023-08-14 MED ORDER — SODIUM CHLORIDE 0.9 % IV SOLN
400.0000 mg | Freq: Once | INTRAVENOUS | Status: AC
  Administered 2023-08-14: 400 mg via INTRAVENOUS
  Filled 2023-08-14: qty 400

## 2023-08-14 MED ORDER — SODIUM CHLORIDE 0.9% FLUSH: 10.0000 mL | Freq: Two times a day (BID) | INTRAVENOUS | Status: DC

## 2023-08-14 MED ORDER — SODIUM CHLORIDE 0.9 % IV SOLN: INTRAVENOUS | Status: DC

## 2023-08-14 NOTE — Patient Instructions (Signed)

## 2023-08-14 NOTE — Progress Notes (Signed)
Patient tolerated iron infusion with no complaints voiced.  Peripheral IV site clean and dry with good blood return noted before and after infusion.  Band aid applied.  VSS with discharge and left in satisfactory condition with no s/s of distress noted.   

## 2023-08-19 ENCOUNTER — Ambulatory Visit: Payer: Medicaid Other | Admitting: Family Medicine

## 2023-08-19 VITALS — BP 126/84 | HR 84 | Temp 98.6°F | Ht 63.0 in | Wt 191.2 lb

## 2023-08-19 DIAGNOSIS — M19042 Primary osteoarthritis, left hand: Secondary | ICD-10-CM

## 2023-08-19 DIAGNOSIS — D509 Iron deficiency anemia, unspecified: Secondary | ICD-10-CM | POA: Diagnosis not present

## 2023-08-19 DIAGNOSIS — M19041 Primary osteoarthritis, right hand: Secondary | ICD-10-CM | POA: Diagnosis not present

## 2023-08-19 DIAGNOSIS — R300 Dysuria: Secondary | ICD-10-CM | POA: Diagnosis not present

## 2023-08-19 DIAGNOSIS — F321 Major depressive disorder, single episode, moderate: Secondary | ICD-10-CM

## 2023-08-19 DIAGNOSIS — I10 Essential (primary) hypertension: Secondary | ICD-10-CM

## 2023-08-19 LAB — POCT URINALYSIS DIP (CLINITEK)
Bilirubin, UA: NEGATIVE
Glucose, UA: NEGATIVE mg/dL
Ketones, POC UA: NEGATIVE mg/dL
Leukocytes, UA: NEGATIVE
Nitrite, UA: NEGATIVE
POC PROTEIN,UA: NEGATIVE
Spec Grav, UA: 1.01 (ref 1.010–1.025)
Urobilinogen, UA: 0.2 U/dL
pH, UA: 5 (ref 5.0–8.0)

## 2023-08-19 MED ORDER — AMLODIPINE BESYLATE 2.5 MG PO TABS: 2.5000 mg | ORAL_TABLET | Freq: Every day | ORAL | 1 refills | Status: DC

## 2023-08-19 MED ORDER — ESCITALOPRAM OXALATE 20 MG PO TABS: ORAL_TABLET | ORAL | 1 refills | Status: DC

## 2023-08-19 NOTE — Patient Instructions (Signed)
  Please send me an update within 3 weeks how you are doing in regards to your moods Once the hematologist has your iron levels doing better if your energy level is not improving please let me know and we will set up a sleep study If you need [place] to set up an appointment with a hand specialist please let me know thanks  See you again in approximately 5 to 6 months

## 2023-08-19 NOTE — Progress Notes (Signed)
   Subjective:    Patient ID: Daisy [name], female    DOB: [birth year], 62 y.o.   MRN: [id number]  Discussed the use of AI scribe software for clinical note transcription with the patient, who gave verbal consent to proceed.  History of Present Illness   The patient, with a history of iron deficiency, reports persistent fatigue despite recent iron infusions and initiation of iron pills. She describes her energy level as "low," spending most of the day in a recliner or on the couch due to exhaustion. The patient also reports difficulty sleeping, but denies significant snoring.  In addition to fatigue, the patient has been dealing with joint pain, particularly in the hands. She reports difficulty with fine motor tasks due to pain and stiffness in the fingers and knuckles. The patient has a history of neck surgery, which has improved her neck pain significantly.  The patient also reports a change in bowel movements, with stools turning dark, likely due to iron supplementation. She denies constipation but did experience diarrhea in the initial days following the iron infusions.  The patient is also on Lexapro for mood management, but has been feeling low recently. She has requested an increase in the dosage of this medication.  Lastly, the patient reports recent back pain and burning sensation during urination, raising concerns about a possible urinary tract infection. She has provided a urine sample for testing.         Review of Systems     Objective:    Physical Exam   VITALS: BP- 126/84 CHEST: lungs clear to auscultation CARDIOVASCULAR: heart sounds normal           Assessment & Plan:  Assessment and Plan    Iron Deficiency Anemia Severe fatigue and low energy despite two iron infusions and initiation of iron pills. Ferritin level was significantly low at 9 (normal >50). -Continue iron pills every other day. -Check iron levels in two weeks.  Chronic Fatigue Persistent  despite iron supplementation. Sleep apnea considered as a differential diagnosis due to persistent fatigue and possible snoring. -If fatigue persists despite correction of iron levels, consider sleep study.  Osteoarthritis Persistent hand pain and functional impairment despite neck surgery. Rheumatologist has recommended surgical consultation. -Contact rheumatologist to clarify if a hand surgeon referral is needed. -Consider occupational therapy for functional adaptation strategies.  Depression On Lexapro 10mg  with ongoing symptoms. -Increase Lexapro to 20mg  daily. -Check response in 3-4 weeks via MyChart message.  General Health Maintenance -Complete colonoscopy as per recommendation from hematology. Follow up with [facility] for scheduling. -Check urine for possible UTI due to back pain and dysuria.      She will give [place] feedback in several weeks through MyChart Possibly will need sleep study Follow-up within 4 months to 5 months Bump up the dose of Lexapro No sign of UTI Blood pressure good control

## 2023-08-20 DIAGNOSIS — M4722 Other spondylosis with radiculopathy, cervical region: Secondary | ICD-10-CM | POA: Diagnosis not present

## 2023-08-25 DIAGNOSIS — Z79899 Other long term (current) drug therapy: Secondary | ICD-10-CM | POA: Diagnosis not present

## 2023-08-27 DIAGNOSIS — Z79899 Other long term (current) drug therapy: Secondary | ICD-10-CM | POA: Diagnosis not present

## 2023-08-31 ENCOUNTER — Inpatient Hospital Stay: Payer: Medicaid Other

## 2023-08-31 DIAGNOSIS — D509 Iron deficiency anemia, unspecified: Secondary | ICD-10-CM | POA: Diagnosis not present

## 2023-08-31 LAB — COMPREHENSIVE METABOLIC PANEL
ALT: 20 U/L (ref 0–44)
AST: 18 U/L (ref 15–41)
Albumin: 3.7 g/dL (ref 3.5–5.0)
Alkaline Phosphatase: 75 U/L (ref 38–126)
Anion gap: 8 (ref 5–15)
BUN: 18 mg/dL (ref 8–23)
CO2: 25 mmol/L (ref 22–32)
Calcium: 8.7 mg/dL — ABNORMAL LOW (ref 8.9–10.3)
Chloride: 104 mmol/L (ref 98–111)
Creatinine, Ser: 1.14 mg/dL — ABNORMAL HIGH (ref 0.44–1.00)
GFR, Estimated: 54 mL/min — ABNORMAL LOW (ref >60)
Glucose, Bld: 137 mg/dL — ABNORMAL HIGH (ref 70–99)
Potassium: 4.1 mmol/L (ref 3.5–5.1)
Sodium: 137 mmol/L (ref 135–145)
Total Bilirubin: 0.4 mg/dL (ref <1.2)
Total Protein: 7.3 g/dL (ref 6.5–8.1)

## 2023-08-31 LAB — IRON AND TIBC
Iron: 40 ug/dL (ref 28–170)
Saturation Ratios: 13 % (ref 10.4–31.8)
TIBC: 305 ug/dL (ref 250–450)
UIBC: 265 ug/dL

## 2023-08-31 LAB — CBC WITH DIFFERENTIAL/PLATELET
Abs Immature Granulocytes: 0.05 10*3/uL (ref 0.00–0.07)
Basophils Absolute: 0.1 10*3/uL (ref 0.0–0.1)
Basophils Relative: 1 %
Eosinophils Absolute: 0.7 10*3/uL — ABNORMAL HIGH (ref 0.0–0.5)
Eosinophils Relative: 8 %
HCT: 45.2 % (ref 36.0–46.0)
Hemoglobin: 14 g/dL (ref 12.0–15.0)
Immature Granulocytes: 1 %
Lymphocytes Relative: 26 %
Lymphs Abs: 2.1 10*3/uL (ref 0.7–4.0)
MCH: 26.6 pg (ref 26.0–34.0)
MCHC: 31 g/dL (ref 30.0–36.0)
MCV: 85.9 fL (ref 80.0–100.0)
Monocytes Absolute: 0.6 10*3/uL (ref 0.1–1.0)
Monocytes Relative: 7 %
Neutro Abs: 4.6 10*3/uL (ref 1.7–7.7)
Neutrophils Relative %: 57 %
Platelets: 184 10*3/uL (ref 150–400)
RBC: 5.26 MIL/uL — ABNORMAL HIGH (ref 3.87–5.11)
RDW: 18.4 % — ABNORMAL HIGH (ref 11.5–15.5)
WBC: 8 10*3/uL (ref 4.0–10.5)
nRBC: 0 % (ref 0.0–0.2)

## 2023-08-31 LAB — FERRITIN: Ferritin: 109 ng/mL (ref 11–307)

## 2023-09-07 ENCOUNTER — Inpatient Hospital Stay: Payer: Medicaid Other | Attending: Oncology | Admitting: Oncology

## 2023-09-07 ENCOUNTER — Encounter: Payer: Self-pay | Admitting: Oncology

## 2023-09-07 VITALS — BP 135/73 | HR 76 | Temp 98.2°F | Resp 18 | Ht 62.0 in | Wt 191.4 lb

## 2023-09-07 DIAGNOSIS — E611 Iron deficiency: Secondary | ICD-10-CM | POA: Insufficient documentation

## 2023-09-07 DIAGNOSIS — F17210 Nicotine dependence, cigarettes, uncomplicated: Secondary | ICD-10-CM | POA: Diagnosis not present

## 2023-09-07 DIAGNOSIS — F17200 Nicotine dependence, unspecified, uncomplicated: Secondary | ICD-10-CM | POA: Diagnosis not present

## 2023-09-07 NOTE — Assessment & Plan Note (Addendum)
Improved ferritin and saturation ratios after iron infusion, but levels still suboptimal. No current anemia. No recent bloody stools. Patient is compliant with oral iron supplementation every other day without constipation. -Administer two more doses of IV iron. -Continue oral iron supplementation every other day. -Delays in scheduling colonoscopy and patient has been working on it -Check labs before next visit in 3 months

## 2023-09-07 NOTE — Assessment & Plan Note (Signed)
Patient reports reduction to half a pack per day. -Encourage continued smoking cessation efforts.

## 2023-09-07 NOTE — Progress Notes (Signed)
[facility] at [facility] HEMATOLOGY FOLLOW-UP VISIT  Babs Sciara, MD  REASON FOR FOLLOW-UP: Iron deficiency  ASSESSMENT & PLAN:  Iron deficiency Improved ferritin and saturation ratios after iron infusion, but levels still suboptimal. No current anemia. No recent bloody stools. Patient is compliant with oral iron supplementation every other day without constipation. -Administer two more doses of IV iron. -Continue oral iron supplementation every other day. -Delays in scheduling colonoscopy and patient has been working on it -Check labs before next visit in 3 months  Smoking Patient reports reduction to half a pack per day. -Encourage continued smoking cessation efforts.   Orders Placed This Encounter  Procedures   Ferritin    Standing Status:   Future    Standing Expiration Date:   09/06/2024   CBC with Differential/Platelet    Standing Status:   Future    Standing Expiration Date:   09/06/2024   Comprehensive metabolic panel    Standing Status:   Future    Standing Expiration Date:   09/06/2024   Iron and TIBC    Standing Status:   Future    Standing Expiration Date:   09/06/2024    The total time spent in the appointment was 20 minutes encounter with patients including review of chart and various tests results, discussions about plan of care and coordination of care plan   All questions were answered. The patient knows to call the clinic with any problems, questions or concerns. No barriers to learning was detected.  Cindie Crumbly, MD 12/2/202412:22 PM   SUMMARY OF HEMATOLOGIC HISTORY: Patient with iron deficiency without anemia. -S/p IV Venofer 400 mg into 2 doses   INTERVAL HISTORY: Daisy [name] 62 y.o. female is here for follow-up for iron deficiency.She received 2 doses of IV Venofer.She reports that her fatigue has improved somewhat, but she still feels tired. She also has back pain, which she attributes to arthritis.She recently  had neck surgery for osteoarthritis, which involved the placement of plates in her neck. Since the surgery, she reports that her neck feels much better and the numbness in her arm is gone.  The patient also mentions a delay in scheduling a colonoscopy due to paperwork issues. She has filled out and sent the required questionnaire, but the office claims they have not received it. The patient is waiting for the office to resend the paperwork.  In terms of lifestyle, the patient has made progress in reducing her smoking habit, now down to half a pack a day from a previous higher amount.  I have reviewed the past medical history, past surgical history, social history and family history with the patient   ALLERGIES:  has No Known Allergies.  MEDICATIONS:  Current Outpatient Medications  Medication Sig Dispense Refill   amLODipine (NORVASC) 2.5 MG tablet Take 1 tablet (2.5 mg total) by mouth daily. 90 tablet 1   escitalopram (LEXAPRO) 20 MG tablet Take 1 tablet by mouth once daily 90 tablet 1   ferrous sulfate 325 (65 FE) MG EC tablet Take 1 tablet (325 mg total) by mouth every other day. 45 tablet 3   methocarbamol (ROBAXIN) 500 MG tablet Take 500 mg by mouth 2 (two) times daily as needed for muscle spasms.     oxyCODONE (OXY IR/ROXICODONE) 5 MG immediate release tablet Take 5 mg by mouth 3 (three) times daily.     Vitamin D, Ergocalciferol, (DRISDOL) 1.25 MG (50000 UNIT) CAPS capsule Take 1 capsule (50,000 Units total)  by mouth every 7 (seven) days. 4 capsule 2   naloxone (NARCAN) nasal spray 4 mg/0.1 mL Place 1 spray into the nose as needed (opioid reversal). (Patient not taking: Reported on 09/07/2023)     No current facility-administered medications for this visit.     REVIEW OF SYSTEMS:   Constitutional: Denies fevers, chills or night sweats Eyes: Denies blurriness of vision Ears, nose, mouth, throat, and face: Denies mucositis or sore throat Respiratory: Denies cough, dyspnea or  wheezes Cardiovascular: Denies palpitation, chest discomfort or lower extremity swelling Gastrointestinal:  Denies nausea, heartburn or change in bowel habits Skin: Denies abnormal skin rashes Lymphatics: Denies new lymphadenopathy or easy bruising Neurological:Denies numbness, tingling or new weaknesses Behavioral/Psych: Mood is stable, no new changes  All other systems were reviewed with the patient and are negative.  PHYSICAL EXAMINATION:   Vitals:   09/07/23 0820  BP: 135/73  Pulse: 76  Resp: 18  Temp: 98.2 F (36.8 C)  SpO2: 96%    GENERAL:alert, no distress and comfortable SKIN: skin color, texture, turgor are normal, no rashes or significant lesions LUNGS: clear to auscultation and percussion with normal breathing effort HEART: regular rate & rhythm and no murmurs and no lower extremity edema ABDOMEN:abdomen soft, non-tender and normal bowel sounds Musculoskeletal:no cyanosis of digits and no clubbing  NEURO: alert & oriented x 3 with fluent speech  LABORATORY DATA:  I have reviewed the data as listed  Lab Results  Component Value Date   WBC 8.0 08/31/2023   NEUTROABS 4.6 08/31/2023   HGB 14.0 08/31/2023   HCT 45.2 08/31/2023   MCV 85.9 08/31/2023   PLT 184 08/31/2023      Component Value Date/Time   NA 137 08/31/2023 0830   NA 140 07/09/2023 1026   K 4.1 08/31/2023 0830   CL 104 08/31/2023 0830   CO2 25 08/31/2023 0830   GLUCOSE 137 (H) 08/31/2023 0830   BUN 18 08/31/2023 0830   BUN 17 07/09/2023 1026   CREATININE 1.14 (H) 08/31/2023 0830   CALCIUM 8.7 (L) 08/31/2023 0830   PROT 7.3 08/31/2023 0830   PROT 7.0 03/06/2023 0923   ALBUMIN 3.7 08/31/2023 0830   ALBUMIN 4.1 03/06/2023 0923   AST 18 08/31/2023 0830   ALT 20 08/31/2023 0830   ALKPHOS 75 08/31/2023 0830   BILITOT 0.4 08/31/2023 0830   BILITOT <0.2 03/06/2023 0923   GFRNONAA 54 (L) 08/31/2023 0830   GFRAA 65 04/13/2019 1348      Chemistry      Component Value Date/Time   NA 137  08/31/2023 0830   NA 140 07/09/2023 1026   K 4.1 08/31/2023 0830   CL 104 08/31/2023 0830   CO2 25 08/31/2023 0830   BUN 18 08/31/2023 0830   BUN 17 07/09/2023 1026   CREATININE 1.14 (H) 08/31/2023 0830      Component Value Date/Time   CALCIUM 8.7 (L) 08/31/2023 0830   ALKPHOS 75 08/31/2023 0830   AST 18 08/31/2023 0830   ALT 20 08/31/2023 0830   BILITOT 0.4 08/31/2023 0830   BILITOT <0.2 03/06/2023 0923     Lab Results  Component Value Date   IRON 40 08/31/2023   TIBC 305 08/31/2023   FERRITIN 109 08/31/2023     Latest Reference Range & Units 08/31/23 08:29  Saturation Ratios 10.4 - 31.8 % 13

## 2023-09-14 ENCOUNTER — Inpatient Hospital Stay: Payer: Medicaid Other

## 2023-09-14 VITALS — BP 121/57 | HR 63 | Temp 97.6°F | Resp 18 | Wt 190.8 lb

## 2023-09-14 DIAGNOSIS — E611 Iron deficiency: Secondary | ICD-10-CM | POA: Diagnosis not present

## 2023-09-14 DIAGNOSIS — D509 Iron deficiency anemia, unspecified: Secondary | ICD-10-CM

## 2023-09-14 MED ORDER — SODIUM CHLORIDE 0.9 % IV SOLN
400.0000 mg | Freq: Once | INTRAVENOUS | Status: AC
  Administered 2023-09-14: 400 mg via INTRAVENOUS
  Filled 2023-09-14: qty 20

## 2023-09-14 MED ORDER — SODIUM CHLORIDE 0.9 % IV SOLN: INTRAVENOUS | Status: DC

## 2023-09-14 NOTE — Progress Notes (Signed)
Patient presents today for iron infusion.  Patient is in satisfactory condition with no new complaints voiced.  Vital signs are stable.  We will proceed with infusion per provider orders.    Peripheral IV started with good blood return pre and post infusion.  Discharged from clinic ambulatory in stable condition. Alert and oriented x 3. F/U with [facility] as scheduled.

## 2023-09-14 NOTE — Patient Instructions (Signed)
 [facility] [place] - A DEPT OF [facility]  Discharge Instructions: Thank you for choosing [facility] to provide your oncology and hematology care.  If you have a lab appointment with the Cancer Center - please note that after April 8th, 2024, all labs will be drawn in the cancer center.  You do not have to check in or register with the main entrance as you have in the past but will complete your check-in in the cancer center.  Wear comfortable clothing and clothing appropriate for easy access to any Portacath or PICC line.   We strive to give you quality time with your provider. You may need to reschedule your appointment if you arrive late (15 or more minutes).  Arriving late affects you and other patients whose appointments are after yours.  Also, if you miss three or more appointments without notifying the office, you may be dismissed from the clinic at the provider's discretion.      For prescription refill requests, have your pharmacy contact our office and allow 72 hours for refills to be completed.    Today you received Venofer IV iron infusion.     BELOW ARE SYMPTOMS THAT SHOULD BE REPORTED IMMEDIATELY: *FEVER GREATER THAN 100.4 F (38 C) OR HIGHER *CHILLS OR SWEATING *NAUSEA AND VOMITING THAT IS NOT CONTROLLED WITH YOUR NAUSEA MEDICATION *UNUSUAL SHORTNESS OF BREATH *UNUSUAL BRUISING OR BLEEDING *URINARY PROBLEMS (pain or burning when urinating, or frequent urination) *BOWEL PROBLEMS (unusual diarrhea, constipation, pain near the anus) TENDERNESS IN MOUTH AND THROAT WITH OR WITHOUT PRESENCE OF ULCERS (sore throat, sores in mouth, or a toothache) UNUSUAL RASH, SWELLING OR PAIN  UNUSUAL VAGINAL DISCHARGE OR ITCHING   Items with * indicate a potential emergency and should be followed up as soon as possible or go to the Emergency Department if any problems should occur.  Please show the CHEMOTHERAPY ALERT CARD or IMMUNOTHERAPY ALERT CARD at  check-in to the Emergency Department and triage nurse.  Should you have questions after your visit or need to cancel or reschedule your appointment, please contact [facility] [place] - A DEPT OF [facility] [phone]  and follow the prompts.  Office hours are 8:00 a.m. to 4:30 p.m. Monday - Friday. Please note that voicemails left after 4:00 p.m. may not be returned until the following business day.  We are closed weekends and major holidays. You have access to a nurse at all times for urgent questions. Please call the main number to the clinic [phone] and follow the prompts.  For any non-urgent questions, you may also contact your provider using MyChart. We now offer e-Visits for anyone 33 and older to request care online for non-urgent symptoms. For details visit [email or web address].   Also download the MyChart app! Go to the app store, search "MyChart", open the app, select [place], and log in with your MyChart username and password.

## 2023-09-22 ENCOUNTER — Telehealth: Payer: Self-pay | Admitting: *Deleted

## 2023-09-22 ENCOUNTER — Ambulatory Visit: Payer: Medicaid Other | Admitting: Gastroenterology

## 2023-09-22 ENCOUNTER — Encounter: Payer: Self-pay | Admitting: *Deleted

## 2023-09-22 ENCOUNTER — Encounter: Payer: Self-pay | Admitting: Gastroenterology

## 2023-09-22 ENCOUNTER — Other Ambulatory Visit: Payer: Self-pay | Admitting: *Deleted

## 2023-09-22 VITALS — BP 136/88 | HR 69 | Temp 97.9°F | Ht 62.0 in | Wt 185.6 lb

## 2023-09-22 DIAGNOSIS — R630 Anorexia: Secondary | ICD-10-CM | POA: Diagnosis not present

## 2023-09-22 DIAGNOSIS — K219 Gastro-esophageal reflux disease without esophagitis: Secondary | ICD-10-CM

## 2023-09-22 DIAGNOSIS — R1319 Other dysphagia: Secondary | ICD-10-CM

## 2023-09-22 DIAGNOSIS — Z83719 Family history of colon polyps, unspecified: Secondary | ICD-10-CM | POA: Diagnosis not present

## 2023-09-22 DIAGNOSIS — D509 Iron deficiency anemia, unspecified: Secondary | ICD-10-CM | POA: Diagnosis not present

## 2023-09-22 DIAGNOSIS — R1310 Dysphagia, unspecified: Secondary | ICD-10-CM | POA: Diagnosis not present

## 2023-09-22 DIAGNOSIS — K625 Hemorrhage of anus and rectum: Secondary | ICD-10-CM | POA: Diagnosis not present

## 2023-09-22 DIAGNOSIS — Z1211 Encounter for screening for malignant neoplasm of colon: Secondary | ICD-10-CM

## 2023-09-22 MED ORDER — PEG 3350-KCL-NA BICARB-NACL 420 G PO SOLR: 4000.0000 mL | Freq: Once | ORAL | 0 refills | Status: AC

## 2023-09-22 NOTE — Telephone Encounter (Signed)
[employer] PA: Status: APPROVED  Authorization #: [id number]  Requested Dates: Nov 11, 2023 - Feb 09, 2024

## 2023-09-22 NOTE — Progress Notes (Signed)
GI Office Note    Referring Provider: Babs Sciara, MD Primary Care Physician:  Babs Sciara, MD  Primary Gastroenterologist: Gerrit Friends.Rourk, MD  Chief Complaint   Chief Complaint  Patient presents with   Anemia    Needs a colonoscopy. Has low iron and does not know why.     History of Present Illness   Daisy [name] is a 62 y.o. female presenting today at the request of Luking, Jonna Coup, MD for IDA and need for colonoscopy.  History of cholecystectomy in 2001.  History of laparoscopic right partial salpingectomy and hernia repair in 2001 secondary to ectopic pregnancy.  Iron/TIBC/Ferritin/ %Sat    Component Value Date/Time   IRON 40 08/31/2023 0829   IRON 36 07/09/2023 1026   TIBC 305 08/31/2023 0829   TIBC 413 07/09/2023 1026   FERRITIN 109 08/31/2023 0829   FERRITIN 25 07/09/2023 1026   IRONPCTSAT 13 08/31/2023 0829   IRONPCTSAT 9 (LL) 07/09/2023 1026       Latest Ref Rng & Units 08/31/2023    8:30 AM 07/09/2023   10:26 AM 05/22/2023    8:34 PM  CBC  WBC 4.0 - 10.5 K/uL 8.0  9.2  11.5   Hemoglobin 12.0 - 15.0 g/dL 40.9  81.1  91.4   Hematocrit 36.0 - 46.0 % 45.2  43.6  36.8   Platelets 150 - 400 K/uL 184  343  204     No prior colonoscopy on file.  Recently seen by hematology/oncology.  Noted to have improved ferritin and saturation after recent iron infusion but reports levels are still suboptimal.  She had been compliant with oral supplementation every other day without constipation.  Her most recent visit was 12/2 in which they plan on administering 2 more doses of IV iron and continuing oral iron every other day.  They have recommended colonoscopy for further evaluation.  In October patient had reported lack of energy as well as weight gain and issues with hair loss.  She also has reported signs of anxiety attacks.  Had also reported occasional bloody stools.  Today:  Has had on and off RLQ abdominal pain. Has started having some pain in her lower  abdomen since 3rd iron infusion. She does report some loose stools with iron infusion but usually stool is normal.  Usually goes first thing in the morning or she wakes up with the urge to go. Sometimes while she is going to the bathroom she has pain and is better after she is finished. Has gassiness more so since the iron. She denies weight loss - she actually reports weight gain. Lack of appetite for the last couple of months - may eat one meal per day. Prior to this she would eat at least 2 meals per day (is not a breakfast person). In the last 4 months she has not seen blood. In the past she has felt sick and then poop blood and then feel fine. Sometimes she has had clots. She was told many years go she had internal hemorrhoids. Typically not constipated. She was having shortness of breath and fatigue and not as bad since she has had the iron infusions.   No personal history of polyps. No family history of colon cancer. Mother with history of polyps.  No family hx of UC or Chron's. No family hx of celiac.   She has pain in her chest at times and difficulty swallowing since about August. Had neck surgery and after that  she felt like this was an issue. She had an anterior surgery. She points to lower sternum area. Tries to drink alto to get things to go down. She has had chronic acid reflux. She takes otc omeprazole currently - once daily. She knows if she does not take it. No NSAID use.   PCP keeps an eye on her kidneys.   She reports she was told she had internal hemorrhoids at age 68 years old. Done in [place].   Wt Readings from Last 3 Encounters:  09/22/23 185 lb 9.6 oz (84.2 kg)  09/14/23 190 lb 12.8 oz (86.5 kg)  09/07/23 191 lb 5.8 oz (86.8 kg)    Current Outpatient Medications  Medication Sig Dispense Refill   amLODipine (NORVASC) 2.5 MG tablet Take 1 tablet (2.5 mg total) by mouth daily. 90 tablet 1   escitalopram (LEXAPRO) 20 MG tablet Take 1 tablet by mouth once daily 90 tablet 1    ferrous sulfate 325 (65 FE) MG EC tablet Take 1 tablet (325 mg total) by mouth every other day. 45 tablet 3   methocarbamol (ROBAXIN) 500 MG tablet Take 500 mg by mouth 2 (two) times daily as needed for muscle spasms.     Vitamin D, Ergocalciferol, (DRISDOL) 1.25 MG (50000 UNIT) CAPS capsule Take 1 capsule (50,000 Units total) by mouth every 7 (seven) days. 4 capsule 2   naloxone (NARCAN) nasal spray 4 mg/0.1 mL Place 1 spray into the nose as needed (opioid reversal). (Patient not taking: Reported on 09/22/2023)     oxyCODONE (OXY IR/ROXICODONE) 5 MG immediate release tablet Take 5 mg by mouth 3 (three) times daily. (Patient not taking: Reported on 09/22/2023)     No current facility-administered medications for this visit.    Past Medical History:  Diagnosis Date   Acute renal failure superimposed on stage 3a chronic kidney disease (HCC) 03/30/2021   Anxiety    Bipolar 1 disorder (HCC)    Chronic pain syndrome    Depression    Fatigue    Fibromyalgia    Kidney disease    Stage 3   Normal cardiac stress test 10/06/2000   Plantar fasciitis    PTSD (post-traumatic stress disorder)    Sepsis due to undetermined organism (HCC) 12/17/2021   SIRS (systemic inflammatory response syndrome) (HCC) 03/30/2021   Thyroid disease     Past Surgical History:  Procedure Laterality Date   ANTERIOR CERVICAL DECOMP/DISCECTOMY FUSION N/A 05/22/2023   Procedure: Anterior Cervical Discectomy Fusion - Cervical Four-Cervical Five - Cervical Five-Cervical Six - Cervical Six-Cervical Seven;  Surgeon: Coletta Memos, MD;  Location: [facility] OR;  Service: Neurosurgery;  Laterality: N/A;   APPENDECTOMY     CESAREAN SECTION     twice '84 and '89   CHOLECYSTECTOMY     ECTOPIC PREGNANCY SURGERY     FINGER SURGERY     HERNIA REPAIR  2004 and 2005   twice  same site after cholecystectomy   NASAL SINUS SURGERY     twice -enlarged sinuses and cleared scar tissue   TONSILLECTOMY      Family History  Problem  Relation Age of Onset   Diabetes Mother    Heart disease Father    Hypothyroidism Father     Allergies as of 09/22/2023   (No Known Allergies)    Social History   Socioeconomic History   Marital status: Married    Spouse name: Rogers Blocker   Number of children: 1   Years of education: Not on file  Highest education level: Some college, no degree  Occupational History   Not on file  Tobacco Use   Smoking status: Former    Current packs/day: 0.00    Types: Cigarettes    Quit date: 01/28/2023    Years since quitting: 0.6    Passive exposure: Past   Smokeless tobacco: Never  Vaping Use   Vaping status: Never Used  Substance and Sexual Activity   Alcohol use: No   Drug use: Yes    Types: Marijuana    Comment: Significantly decreasing marijuana use since 01/28/2023 but not in remission yet   Sexual activity: Yes    Birth control/protection: None  Other Topics Concern   Not on file  Social History Narrative   Not on file   Social Drivers of Health   Financial Resource Strain: Medium Risk (08/19/2023)   Overall Financial Resource Strain (CARDIA)    Difficulty of Paying Living Expenses: Somewhat hard  Food Insecurity: No Food Insecurity (08/19/2023)   Hunger Vital Sign    Worried About Running Out of Food in the Last Year: Never true    Ran Out of Food in the Last Year: Never true  Transportation Needs: No Transportation Needs (08/19/2023)   PRAPARE - Administrator, Civil Service (Medical): No    Lack of Transportation (Non-Medical): No  Physical Activity: Insufficiently Active (08/19/2023)   Exercise Vital Sign    Days of Exercise per Week: 1 day    Minutes of Exercise per Session: 30 min  Stress: Stress Concern Present (08/19/2023)   [employer] of Occupational Health - Occupational Stress Questionnaire    Feeling of Stress : Rather much  Social Connections: Moderately Isolated (08/19/2023)   Social Connection and Isolation Panel [NHANES]     Frequency of Communication with Friends and Family: Once a week    Frequency of Social Gatherings with Friends and Family: Never    Attends Religious Services: 1 to 4 times per year    Active Member of [employer] or Organizations: No    Attends Engineer, structural: Not on file    Marital Status: Married  Catering manager Violence: Not At Risk (05/24/2023)   Humiliation, Afraid, Rape, and Kick questionnaire    Fear of Current or Ex-Partner: No    Emotionally Abused: No    Physically Abused: No    Sexually Abused: No    Review of Systems   Gen: Denies any fever, chills, fatigue, weight loss, lack of appetite.  CV: Denies chest pain, heart palpitations, peripheral edema, syncope.  Resp: Denies shortness of breath at rest or with exertion. Denies wheezing or cough.  GI: see HPI GU : Denies urinary burning, urinary frequency, urinary hesitancy MS: Denies joint pain, muscle weakness, cramps, or limitation of movement.  Derm: Denies rash, itching, dry skin Psych: Denies depression, anxiety, memory loss, and confusion Heme: Denies bruising, bleeding, and enlarged lymph nodes.  Physical Exam   BP 136/88 (BP Location: Right Arm, Patient Position: Sitting, Cuff Size: Normal)   Pulse 69   Temp 97.9 F (36.6 C) (Temporal)   Ht 5\' 2"  (1.575 m)   Wt 185 lb 9.6 oz (84.2 kg)   BMI 33.95 kg/m   General:   Alert and oriented. Pleasant and cooperative. Well-nourished and well-developed.  Head:  Normocephalic and atraumatic. Eyes:  Without icterus, sclera clear and conjunctiva pink.  Ears:  Normal auditory acuity. Mouth:  No deformity or lesions, oral mucosa pink.  Lungs:  Clear to auscultation  bilaterally. No wheezes, rales, or rhonchi. No distress.  Heart:  S1, S2 present without murmurs appreciated.  Abdomen:  +BS, soft, non-tender and non-distended. No HSM noted. No guarding or rebound. No masses appreciated.  Rectal:  Deferred  Msk:  Symmetrical without gross deformities. Normal  posture. Extremities:  Without edema. Neurologic:  Alert and  oriented x4;  grossly normal neurologically. Skin:  Intact without significant lesions or rashes. Psych:  Alert and cooperative. Normal mood and affect.  Assessment   OLIVET SMITHERS is a 62 y.o. female with a history of chronic pain, fibromyalgia, arthritis, CKD stage IIIa, anxiety, thyroid disease, previous appendectomy, cholecystectomy, multiple abdominal surgeries/hernia repair presenting today for evaluation of iron deficiency anemia.  Iron deficiency anemia, rectal bleeding: History of rectal bleeding about 4 months ago, occurring heal without stools.  Would have certain urge to have a bowel movement and then have some blood with clots.  Etiology unclear at this time but could be diverticulosis although unclear if she has history of this.  Recent hemoglobin stable but with severe iron deficiency given very low ferritin levels.  Has had some improvement with iron infusions but is currently receiving 2 additional iron infusions.  She denies any constipation but does note a history of hemorrhoids.  She could have some bleeding from hemorrhoids although with clots this could be diverticular in nature or other cause such as AVMs, polyps, or ischemia.  We will further evaluate for these as well as potential malignancy with a colonoscopy.  Given some of her upper GI symptoms we are also going to proceed with an upper endoscopy for evaluation.  Screening for colon cancer: Last colonoscopy around age 80.  She reports this was normal and only had internal hemorrhoids noted.  Has had a lack of appetite but denies any significant weight loss, she actually reports weight gain at home despite our scales today indicating weight loss.  She has also had some nausea and dysphagia and rectal bleeding as stated above.  Has family history of colon polyps in her mother.  Will proceed with colonoscopy for further evaluation.  GERD, dysphagia, lack of  appetite: Typical GERD symptoms fairly well-controlled with omeprazole 20 mg once daily.  Since her neck surgery earlier this year she has been complaining of some dysphagia and feeling of food being stuck substernally in the more distal chest.  No significant epigastric pain on exam today.  Has had a history of multiple ventral hernia repairs in the epigastric/upper periumbilical region.  She is also status post cholecystectomy and appendectomy.  Will consider abdominal imaging if her right lower quadrant pain worsens or does not improve over the next several weeks plan discussed potentially increasing omeprazole to 40 mg once daily.  Proceeding with upper endoscopy with dilation for evaluation of esophageal ring, web, or other stenosis or stricture in regards to scar tissue from recent surgery, esophagitis, gastritis, duodenitis, peptic ulcer disease, and rule out malignancy.  PLAN   Proceed with upper endoscopy with dilation and colonoscopy with propofol by Dr. Jena Gauss in near future: the risks, benefits, and alternatives have been discussed with the patient in detail. The patient states understanding and desires to proceed. ASA 3 Hold iron for 1 week prior TriLyte prep Notify if right lower quadrant pain worsens, consider CT A/P. Continue iron every other day until holding for procedures Continue omeprazole 20 mg once daily, consider increasing to 40 mg if symptoms worsen GERD diet Follow-up in 3 months   Brooke Bonito, MSN, FNP-BC,  AGACNP-BC [facility]

## 2023-09-22 NOTE — Patient Instructions (Addendum)
We will get you scheduled in the near future for an upper endoscopy and colonoscopy with Dr. Jena Gauss.  Please let me know if your right lower quadrant pain worsens or you have any return of rectal bleeding.  Continue taking omeprazole 20 mg once daily.  If you are having some worsening symptoms of reflux or trouble swallowing between now and your procedures you can increase to 40 mg once daily and let me know.  Continue taking your iron every other day and continue with iron infusions.  We will follow-up in 3 months, this will be after your procedures.  I hope you have a Altamese Cabal Christmas and a happy new year!  It was a pleasure to see you today. I want to create trusting relationships with patients. If you receive a survey regarding your visit,  I greatly appreciate you taking time to fill this out on paper or through your MyChart. I value your feedback.  Brooke Bonito, MSN, FNP-BC, AGACNP-BC [facility]

## 2023-09-23 ENCOUNTER — Encounter: Payer: Self-pay | Admitting: *Deleted

## 2023-09-23 DIAGNOSIS — Z79899 Other long term (current) drug therapy: Secondary | ICD-10-CM | POA: Diagnosis not present

## 2023-09-25 ENCOUNTER — Inpatient Hospital Stay: Payer: Medicaid Other

## 2023-09-25 VITALS — BP 145/79 | HR 63 | Temp 97.8°F | Resp 18

## 2023-09-25 DIAGNOSIS — D509 Iron deficiency anemia, unspecified: Secondary | ICD-10-CM

## 2023-09-25 DIAGNOSIS — Z79899 Other long term (current) drug therapy: Secondary | ICD-10-CM | POA: Diagnosis not present

## 2023-09-25 DIAGNOSIS — E611 Iron deficiency: Secondary | ICD-10-CM | POA: Diagnosis not present

## 2023-09-25 MED ORDER — SODIUM CHLORIDE 0.9 % IV SOLN
Freq: Once | INTRAVENOUS | Status: AC
Start: 2023-09-25 — End: 2023-09-25

## 2023-09-25 MED ORDER — SODIUM CHLORIDE 0.9 % IV SOLN
400.0000 mg | Freq: Once | INTRAVENOUS | Status: AC
Start: 2023-09-25 — End: 2023-09-25
  Administered 2023-09-25: 400 mg via INTRAVENOUS
  Filled 2023-09-25: qty 400

## 2023-09-25 NOTE — Progress Notes (Signed)
Patient tolerated iron infusion with no complaints voiced.  Peripheral IV site clean and dry with good blood return noted before and after infusion.  Band aid applied.  VSS with discharge and left in satisfactory condition with no s/s of distress noted.   

## 2023-09-25 NOTE — Patient Instructions (Signed)
 [facility] [place] - A DEPT OF [facility]  Discharge Instructions: Thank you for choosing [facility] to provide your oncology and hematology care.  If you have a lab appointment with the Cancer Center - please note that after April 8th, 2024, all labs will be drawn in the cancer center.  You do not have to check in or register with the main entrance as you have in the past but will complete your check-in in the cancer center.  Wear comfortable clothing and clothing appropriate for easy access to any Portacath or PICC line.   We strive to give you quality time with your provider. You may need to reschedule your appointment if you arrive late (15 or more minutes).  Arriving late affects you and other patients whose appointments are after yours.  Also, if you miss three or more appointments without notifying the office, you may be dismissed from the clinic at the provider's discretion.      For prescription refill requests, have your pharmacy contact our office and allow 72 hours for refills to be completed.    Today you received the following Venofer, return as scheduled.   To help prevent nausea and vomiting after your treatment, we encourage you to take your nausea medication as directed.  BELOW ARE SYMPTOMS THAT SHOULD BE REPORTED IMMEDIATELY: *FEVER GREATER THAN 100.4 F (38 C) OR HIGHER *CHILLS OR SWEATING *NAUSEA AND VOMITING THAT IS NOT CONTROLLED WITH YOUR NAUSEA MEDICATION *UNUSUAL SHORTNESS OF BREATH *UNUSUAL BRUISING OR BLEEDING *URINARY PROBLEMS (pain or burning when urinating, or frequent urination) *BOWEL PROBLEMS (unusual diarrhea, constipation, pain near the anus) TENDERNESS IN MOUTH AND THROAT WITH OR WITHOUT PRESENCE OF ULCERS (sore throat, sores in mouth, or a toothache) UNUSUAL RASH, SWELLING OR PAIN  UNUSUAL VAGINAL DISCHARGE OR ITCHING   Items with * indicate a potential emergency and should be followed up as soon as possible or  go to the Emergency Department if any problems should occur.  Please show the CHEMOTHERAPY ALERT CARD or IMMUNOTHERAPY ALERT CARD at check-in to the Emergency Department and triage nurse.  Should you have questions after your visit or need to cancel or reschedule your appointment, please contact [facility] [place] - A DEPT OF [facility]igh[facility] [phone]  and follow the prompts.  Office hours are 8:00 a.m. to 4:30 p.m. Monday - Friday. Please note that voicemails left after 4:00 p.m. may not be returned until the following business day.  We are closed weekends and major holidays. You have access to a nurse at all times for urgent questions. Please call the main number to the clinic [phone] and follow the prompts.  For any non-urgent questions, you may also contact your provider using MyChart. We now offer e-Visits for anyone 1 and older to request care online for non-urgent symptoms. For details visit [email or web address].   Also download the MyChart app! Go to the app store, search "MyChart", open the app, select [place], and log in with your MyChart username and password.

## 2023-09-28 DIAGNOSIS — Z79899 Other long term (current) drug therapy: Secondary | ICD-10-CM | POA: Diagnosis not present

## 2023-10-02 DIAGNOSIS — Z79899 Other long term (current) drug therapy: Secondary | ICD-10-CM | POA: Diagnosis not present

## 2023-11-09 ENCOUNTER — Encounter (HOSPITAL_COMMUNITY)
Admission: RE | Admit: 2023-11-09 | Discharge: 2023-11-09 | Disposition: A | Discharge status: Home / Self Care | Payer: Medicaid Other | Source: Ambulatory Visit | Attending: Internal Medicine | Admitting: Internal Medicine

## 2023-11-11 ENCOUNTER — Encounter (HOSPITAL_COMMUNITY)
Admission: RE | Disposition: A | Discharge status: Home / Self Care | Payer: Self-pay | Source: Home / Self Care | Attending: Internal Medicine

## 2023-11-11 ENCOUNTER — Ambulatory Visit (HOSPITAL_COMMUNITY)
Admission: RE | Admit: 2023-11-11 | Discharge: 2023-11-11 | Disposition: A | Discharge status: Home / Self Care | Payer: Medicaid Other | Attending: Internal Medicine | Admitting: Internal Medicine

## 2023-11-11 ENCOUNTER — Ambulatory Visit (HOSPITAL_BASED_OUTPATIENT_CLINIC_OR_DEPARTMENT_OTHER): Payer: Medicaid Other | Admitting: Anesthesiology

## 2023-11-11 ENCOUNTER — Ambulatory Visit (HOSPITAL_COMMUNITY): Payer: Medicaid Other | Admitting: Anesthesiology

## 2023-11-11 ENCOUNTER — Encounter (HOSPITAL_COMMUNITY): Payer: Self-pay | Admitting: Internal Medicine

## 2023-11-11 DIAGNOSIS — K2289 Other specified disease of esophagus: Secondary | ICD-10-CM | POA: Diagnosis not present

## 2023-11-11 DIAGNOSIS — R1310 Dysphagia, unspecified: Secondary | ICD-10-CM

## 2023-11-11 DIAGNOSIS — K625 Hemorrhage of anus and rectum: Secondary | ICD-10-CM | POA: Diagnosis not present

## 2023-11-11 DIAGNOSIS — K219 Gastro-esophageal reflux disease without esophagitis: Secondary | ICD-10-CM

## 2023-11-11 DIAGNOSIS — D509 Iron deficiency anemia, unspecified: Secondary | ICD-10-CM | POA: Insufficient documentation

## 2023-11-11 DIAGNOSIS — K269 Duodenal ulcer, unspecified as acute or chronic, without hemorrhage or perforation: Secondary | ICD-10-CM | POA: Diagnosis not present

## 2023-11-11 DIAGNOSIS — K644 Residual hemorrhoidal skin tags: Secondary | ICD-10-CM | POA: Insufficient documentation

## 2023-11-11 DIAGNOSIS — F419 Anxiety disorder, unspecified: Secondary | ICD-10-CM | POA: Diagnosis not present

## 2023-11-11 DIAGNOSIS — E039 Hypothyroidism, unspecified: Secondary | ICD-10-CM | POA: Diagnosis not present

## 2023-11-11 DIAGNOSIS — Z5986 Financial insecurity: Secondary | ICD-10-CM | POA: Diagnosis not present

## 2023-11-11 DIAGNOSIS — K642 Third degree hemorrhoids: Secondary | ICD-10-CM | POA: Insufficient documentation

## 2023-11-11 DIAGNOSIS — K5730 Diverticulosis of large intestine without perforation or abscess without bleeding: Secondary | ICD-10-CM | POA: Insufficient documentation

## 2023-11-11 DIAGNOSIS — K254 Chronic or unspecified gastric ulcer with hemorrhage: Secondary | ICD-10-CM

## 2023-11-11 DIAGNOSIS — F319 Bipolar disorder, unspecified: Secondary | ICD-10-CM | POA: Insufficient documentation

## 2023-11-11 DIAGNOSIS — Z139 Encounter for screening, unspecified: Secondary | ICD-10-CM | POA: Diagnosis not present

## 2023-11-11 DIAGNOSIS — K2100 Gastro-esophageal reflux disease with esophagitis, without bleeding: Secondary | ICD-10-CM | POA: Diagnosis not present

## 2023-11-11 DIAGNOSIS — K259 Gastric ulcer, unspecified as acute or chronic, without hemorrhage or perforation: Secondary | ICD-10-CM | POA: Diagnosis not present

## 2023-11-11 DIAGNOSIS — K209 Esophagitis, unspecified without bleeding: Secondary | ICD-10-CM | POA: Diagnosis not present

## 2023-11-11 DIAGNOSIS — D649 Anemia, unspecified: Secondary | ICD-10-CM | POA: Diagnosis not present

## 2023-11-11 DIAGNOSIS — K264 Chronic or unspecified duodenal ulcer with hemorrhage: Secondary | ICD-10-CM | POA: Diagnosis not present

## 2023-11-11 DIAGNOSIS — Z87891 Personal history of nicotine dependence: Secondary | ICD-10-CM | POA: Insufficient documentation

## 2023-11-11 HISTORY — PX: COLONOSCOPY WITH PROPOFOL: SHX5780

## 2023-11-11 HISTORY — PX: ESOPHAGOGASTRODUODENOSCOPY (EGD) WITH PROPOFOL: SHX5813

## 2023-11-11 HISTORY — PX: BIOPSY: SHX5522

## 2023-11-11 HISTORY — PX: MALONEY DILATION: SHX5535

## 2023-11-11 SURGERY — COLONOSCOPY WITH PROPOFOL
Anesthesia: General

## 2023-11-11 MED ORDER — LACTATED RINGERS IV SOLN: INTRAVENOUS | Status: DC | PRN

## 2023-11-11 MED ORDER — SODIUM CHLORIDE 0.9% FLUSH: 3.0000 mL | Freq: Two times a day (BID) | INTRAVENOUS | Status: DC

## 2023-11-11 MED ORDER — SODIUM CHLORIDE 0.9% FLUSH: 3.0000 mL | INTRAVENOUS | Status: DC | PRN

## 2023-11-11 MED ORDER — PROPOFOL 500 MG/50ML IV EMUL
INTRAVENOUS | Status: DC | PRN
  Administered 2023-11-11: 150 ug/kg/min via INTRAVENOUS

## 2023-11-11 MED ORDER — LIDOCAINE HCL (PF) 2 % IJ SOLN
INTRAMUSCULAR | Status: DC | PRN
  Administered 2023-11-11: 100 mg via INTRADERMAL

## 2023-11-11 MED ORDER — DEXMEDETOMIDINE HCL IN NACL 80 MCG/20ML IV SOLN
INTRAVENOUS | Status: AC
  Filled 2023-11-11: qty 20

## 2023-11-11 MED ORDER — PHENYLEPHRINE 80 MCG/ML (10ML) SYRINGE FOR IV PUSH (FOR BLOOD PRESSURE SUPPORT)
PREFILLED_SYRINGE | INTRAVENOUS | Status: AC
  Filled 2023-11-11: qty 10

## 2023-11-11 MED ORDER — PHENYLEPHRINE 80 MCG/ML (10ML) SYRINGE FOR IV PUSH (FOR BLOOD PRESSURE SUPPORT)
PREFILLED_SYRINGE | INTRAVENOUS | Status: DC | PRN
  Administered 2023-11-11 (×2): 160 ug via INTRAVENOUS

## 2023-11-11 MED ORDER — PROPOFOL 10 MG/ML IV BOLUS
INTRAVENOUS | Status: DC | PRN
  Administered 2023-11-11: 100 mg via INTRAVENOUS
  Administered 2023-11-11: 50 mg via INTRAVENOUS

## 2023-11-11 MED ORDER — STERILE WATER FOR IRRIGATION IR SOLN
Status: DC | PRN
  Administered 2023-11-11: 100 mL

## 2023-11-11 NOTE — H&P (Signed)
 @LOGO @   Primary Care Physician:  Alphonsa Glendia LABOR, MD Primary Gastroenterologist:  Dr. Shaaron  Pre-Procedure History & Physical: HPI:  Daisy [name] is a 63 y.o. female here for  further evaluation of rectal bleeding iron  deficiency anemia GERD and esophageal dysphagia  Past Medical History:  Diagnosis Date   Acute renal failure superimposed on stage 3a chronic kidney disease (HCC) 03/30/2021   Anxiety    Bipolar 1 disorder (HCC)    Chronic pain syndrome    Depression    Fatigue    Fibromyalgia    Kidney disease    Stage 3   Normal cardiac stress test 10/06/2000   Plantar fasciitis    PTSD (post-traumatic stress disorder)    Sepsis due to undetermined organism (HCC) 12/17/2021   SIRS (systemic inflammatory response syndrome) (HCC) 03/30/2021   Thyroid  disease     Past Surgical History:  Procedure Laterality Date   ANTERIOR CERVICAL DECOMP/DISCECTOMY FUSION N/A 05/22/2023   Procedure: Anterior Cervical Discectomy Fusion - Cervical Four-Cervical Five - Cervical Five-Cervical Six - Cervical Six-Cervical Seven;  Surgeon: Gillie Duncans, MD;  Location: [facility] OR;  Service: Neurosurgery;  Laterality: N/A;   APPENDECTOMY     CESAREAN SECTION     twice '84 and '89   CHOLECYSTECTOMY     ECTOPIC PREGNANCY SURGERY     FINGER SURGERY     HERNIA REPAIR  2004 and 2005   twice  same site after cholecystectomy   NASAL SINUS SURGERY     twice -enlarged sinuses and cleared scar tissue   TONSILLECTOMY      Prior to Admission medications   Medication Sig Start Date End Date Taking? Authorizing Provider  amLODipine  (NORVASC ) 2.5 MG tablet Take 1 tablet (2.5 mg total) by mouth daily. 08/19/23  Yes Alphonsa Glendia LABOR, MD  escitalopram  (LEXAPRO ) 20 MG tablet Take 1 tablet by mouth once daily 08/19/23  Yes Luking, Glendia LABOR, MD  methocarbamol (ROBAXIN) 500 MG tablet Take 500 mg by mouth 2 (two) times daily as needed for muscle spasms.   Yes [provider]  ferrous sulfate  325 (65 FE) MG EC  tablet Take 1 tablet (325 mg total) by mouth every other day. 08/03/23   Kandala, Hyndavi, MD  naloxone [facility]) nasal spray 4 mg/0.1 mL Place 1 spray into the nose as needed (opioid reversal). Patient not taking: Reported on 09/22/2023 04/16/23   [provider]  oxyCODONE  (OXY IR/ROXICODONE ) 5 MG immediate release tablet Take 5 mg by mouth 3 (three) times daily. Patient not taking: Reported on 09/25/2023    [provider]  Vitamin D , Ergocalciferol , (DRISDOL ) 1.25 MG (50000 UNIT) CAPS capsule Take 1 capsule (50,000 Units total) by mouth every 7 (seven) days. 03/12/23   Alphonsa Glendia LABOR, MD    Allergies as of 09/22/2023   (No Known Allergies)    Family History  Problem Relation Age of Onset   Diabetes Mother    Heart disease Father    Hypothyroidism Father     Social History   Socioeconomic History   Marital status: Married    Spouse name: Roe   Number of children: 1   Years of education: Not on file   Highest education level: Some college, no degree  Occupational History   Not on file  Tobacco Use   Smoking status: Former    Current packs/day: 0.00    Types: Cigarettes    Quit date: 01/28/2023    Years since quitting: 0.7    Passive  exposure: Past   Smokeless tobacco: Never  Vaping Use   Vaping status: Never Used  Substance and Sexual Activity   Alcohol use: No   Drug use: Yes    Types: Marijuana    Comment: Significantly decreasing marijuana use since 01/28/2023 but not in remission yet   Sexual activity: Yes    Birth control/protection: None  Other Topics Concern   Not on file  Social History Narrative   Not on file   Social Drivers of Health   Financial Resource Strain: Medium Risk (08/19/2023)   Overall Financial Resource Strain (CARDIA)    Difficulty of Paying Living Expenses: Somewhat hard  Food Insecurity: No Food Insecurity (08/19/2023)   Hunger Vital Sign    Worried About Running Out of Food in the Last Year: Never true    Ran Out  of Food in the Last Year: Never true  Transportation Needs: No Transportation Needs (08/19/2023)   PRAPARE - Administrator, Civil Service (Medical): No    Lack of Transportation (Non-Medical): No  Physical Activity: Insufficiently Active (08/19/2023)   Exercise Vital Sign    Days of Exercise per Week: 1 day    Minutes of Exercise per Session: 30 min  Stress: Stress Concern Present (08/19/2023)   [employer] of Occupational Health - Occupational Stress Questionnaire    Feeling of Stress : Rather much  Social Connections: Moderately Isolated (08/19/2023)   Social Connection and Isolation Panel [NHANES]    Frequency of Communication with Friends and Family: Once a week    Frequency of Social Gatherings with Friends and Family: Never    Attends Religious Services: 1 to 4 times per year    Active Member of [employer] or Organizations: No    Attends Engineer, Structural: Not on file    Marital Status: Married  Catering Manager Violence: Not At Risk (05/24/2023)   Humiliation, Afraid, Rape, and Kick questionnaire    Fear of Current or Ex-Partner: No    Emotionally Abused: No    Physically Abused: No    Sexually Abused: No    Review of Systems: See HPI, otherwise negative ROS  Physical Exam: BP (!) 149/94 (BP Location: Right Arm)   Pulse 70   Temp 97.8 F (36.6 C)   Resp 15   SpO2 99%  General:   Alert,  Well-developed, well-nourished, pleasant and cooperative in NAD  Lungs:  Clear throughout to auscultation.   No wheezes, crackles, or rhonchi. No acute distress. Heart:  Regular rate and rhythm; no murmurs, clicks, rubs,  or gallops.  Abdomen: Non-distended, normal bowel sounds.  Soft and nontender without appreciable mass or hepatosplenomegaly.    Impression/Plan:   63 year old lady with poorly controlled GERD and esophageal dysphagia also iron  deficiency anemia and rectal bleeding.  Here for an EGD with possible esophageal dilation and colonoscopy per  plan.  The risks, benefits, limitations, imponderables and alternatives regarding both EGD and colonoscopy have been reviewed with the patient. Questions have been answered. All parties agreeable.       Notice: This dictation was prepared with Dragon dictation along with smaller phrase technology. Any transcriptional errors that result from this process are unintentional and may not be corrected upon review.

## 2023-11-11 NOTE — Anesthesia Procedure Notes (Signed)
 Date/Time: 11/11/2023 12:13 PM  Performed by: Eliodoro Deward FALCON, CRNAPre-anesthesia Checklist: Patient identified, Emergency Drugs available, Suction available and Patient being monitored Patient Re-evaluated:Patient Re-evaluated prior to induction Oxygen Delivery Method: Nasal cannula Induction Type: IV induction Placement Confirmation: positive ETCO2 Comments: Optiflow High Flow  O2 used.

## 2023-11-11 NOTE — Discharge Instructions (Addendum)
 EGD Discharge instructions Please read the instructions outlined below and refer to this sheet in the next few weeks. These discharge instructions provide you with general information on caring for yourself after you leave the hospital. Your doctor may also give you specific instructions. While your treatment has been planned according to the most current medical practices available, unavoidable complications occasionally occur. If you have any problems or questions after discharge, please call your doctor. ACTIVITY You may resume your regular activity but move at a slower pace for the next 24 hours.  Take frequent rest periods for the next 24 hours.  Walking will help expel (get rid of) the air and reduce the bloated feeling in your abdomen.  No driving for 24 hours (because of the anesthesia (medicine) used during the test).  You may shower.  Do not sign any important legal documents or operate any machinery for 24 hours (because of the anesthesia used during the test).  NUTRITION Drink plenty of fluids.  You may resume your normal diet.  Begin with a light meal and progress to your normal diet.  Avoid alcoholic beverages for 24 hours or as instructed by your caregiver.  MEDICATIONS You may resume your normal medications unless your caregiver tells you otherwise.  WHAT YOU CAN EXPECT TODAY You may experience abdominal discomfort such as a feeling of fullness or "gas" pains.  FOLLOW-UP Your doctor will discuss the results of your test with you.  SEEK IMMEDIATE MEDICAL ATTENTION IF ANY OF THE FOLLOWING OCCUR: Excessive nausea (feeling sick to your stomach) and/or vomiting.  Severe abdominal pain and distention (swelling).  Trouble swallowing.  Temperature over 101 F (37.8 C).  Rectal bleeding or vomiting of blood.    Colonoscopy Discharge Instructions  Read the instructions outlined below and refer to this sheet in the next few weeks. These discharge instructions provide you with  general information on caring for yourself after you leave the hospital. Your doctor may also give you specific instructions. While your treatment has been planned according to the most current medical practices available, unavoidable complications occasionally occur. If you have any problems or questions after discharge, call Dr. Shaaron at [phone]. ACTIVITY You may resume your regular activity, but move at a slower pace for the next 24 hours.  Take frequent rest periods for the next 24 hours.  Walking will help get rid of the air and reduce the bloated feeling in your belly (abdomen).  No driving for 24 hours (because of the medicine (anesthesia) used during the test).   Do not sign any important legal documents or operate any machinery for 24 hours (because of the anesthesia used during the test).  NUTRITION Drink plenty of fluids.  You may resume your normal diet as instructed by your doctor.  Begin with a light meal and progress to your normal diet. Heavy or fried foods are harder to digest and may make you feel sick to your stomach (nauseated).  Avoid alcoholic beverages for 24 hours or as instructed.  MEDICATIONS You may resume your normal medications unless your doctor tells you otherwise.  WHAT YOU CAN EXPECT TODAY Some feelings of bloating in the abdomen.  Passage of more gas than usual.  Spotting of blood in your stool or on the toilet paper.  IF YOU HAD POLYPS REMOVED DURING THE COLONOSCOPY: No aspirin  products for 7 days or as instructed.  No alcohol for 7 days or as instructed.  Eat a soft diet for the next 24 hours.  FINDING  OUT THE RESULTS OF YOUR TEST Not all test results are available during your visit. If your test results are not back during the visit, make an appointment with your caregiver to find out the results. Do not assume everything is normal if you have not heard from your caregiver or the medical facility. It is important for you to follow up on all of your test  results.  SEEK IMMEDIATE MEDICAL ATTENTION IF: You have more than a spotting of blood in your stool.  Your belly is swollen (abdominal distention).  You are nauseated or vomiting.  You have a temperature over 101.  You have abdominal pain or discomfort that is severe or gets worse throughout the day.      your esophagus and stomach are severely inflamed.  Biopsies taken.  Your esophagus was stretched today  If you happen to be taking any Advil Aleve  ibuprofen or any aspirin  powder such as Stanback's, Goody powder or BC's,   Please stop using them.   no polyps in your colon.  You have extensive diverticulosis and internal hemorrhoids   recommend repeat colonoscopy for screening in 10 years   stop omeprazole.  New prescription begin Protonix  40 mg pill twice daily taken 30 minutes before breakfast and supper.    This prescription has already been sent to your drugstore from my office.    Further recommendations to follow pending review of pathology report   office visit with Charmaine Melia in 1 month   at patient request, I called [facility].  Left a message. at [phone] to voicemail

## 2023-11-11 NOTE — Op Note (Signed)
 [facility] Patient Name: Daisy [name] Procedure Date: 11/11/2023 11:27 AM MRN: [id number] Date of Birth: Feb 22, 1961 Attending MD: Lamar Ozell Hollingshead , MD, [id number] CSN: [id number] Age: 63 Admit Type: Outpatient Procedure:                Colonoscopy Indications:              Rectal bleeding Providers:                Lamar Ozell Hollingshead, MD, Jon Loge, Devere Lodge Referring MD:              Medicines:                Propofol  per Anesthesia Complications:            No immediate complications. Estimated Blood Loss:     Estimated blood loss: none. Procedure:                Pre-Anesthesia Assessment:                           - Prior to the procedure, a History and Physical                            was performed, and patient medications and                            allergies were reviewed. The patient's tolerance of                            previous anesthesia was also reviewed. The risks                            and benefits of the procedure and the sedation                            options and risks were discussed with the patient.                            All questions were answered, and informed consent                            was obtained. ASA Grade Assessment: III - A patient                            with severe systemic disease. After reviewing the                            risks and benefits, the patient was deemed in                            satisfactory condition to undergo the procedure.                           After  obtaining informed consent, the colonoscope                            was passed under direct vision. Throughout the                            procedure, the patient's blood pressure, pulse, and                            oxygen saturations were monitored continuously. The                            [id number]) scope was introduced through the                            anus and advanced to  the the cecum, identified by                            appendiceal orifice and ileocecal valve. The                            colonoscopy was performed without difficulty. The                            patient tolerated the procedure well. The quality                            of the bowel preparation was adequate. The                            ileocecal valve, appendiceal orifice, and rectum                            were photographed. The colonoscopy was performed                            without difficulty. Scope In: 12:35:21 PM Scope Out: 12:56:47 PM Scope Withdrawal Time: 0 hours 9 minutes 26 seconds  Total Procedure Duration: 0 hours 21 minutes 26 seconds  Findings:      The perianal and digital rectal examinations were normal.      Non-bleeding external and internal hemorrhoids were found during       retroflexion. The hemorrhoids were Grade III (internal hemorrhoids that       prolapse but require manual reduction).      Many medium-mouthed diverticula were found in the entire colon.      The exam was otherwise without abnormality on direct and retroflexion       views. Impression:               - Non-bleeding external and internal hemorrhoids.                           - Diverticulosis in the entire examined colon.                           -  The examination was otherwise normal on direct                            and retroflexion views.                           - No specimens collected. Suspect benign anorectal                            bleeding. See EGD report Moderate Sedation:      Moderate (conscious) sedation was personally administered by an       anesthesia professional. The following parameters were monitored: oxygen       saturation, heart rate, blood pressure, respiratory rate, EKG, adequacy       of pulmonary ventilation, and response to care. Recommendation:           - Patient has a contact number available for                            emergencies.  The signs and symptoms of potential                            delayed complications were discussed with the                            patient. Return to normal activities tomorrow.                            Written discharge instructions were provided to the                            patient.                           - Advance diet as tolerated. Repeat colonoscopy for                            screening purposes in 10 years. Office follow-up                            appointment 4 weeks Procedure Code(s):        --- Professional ---                           [phone], Colonoscopy, flexible; diagnostic, including                            collection of specimen(s) by brushing or washing,                            when performed (separate procedure) Diagnosis Code(s):        --- Professional ---                           X35.7, Third degree hemorrhoids  K62.5, Hemorrhage of anus and rectum                           K57.30, Diverticulosis of large intestine without                            perforation or abscess without bleeding CPT copyright 2022 American Medical Association. All rights reserved. The codes documented in this report are preliminary and upon coder review may  be revised to meet current compliance requirements. [name]. Meztli Llanas, MD Lamar Ozell Hollingshead, MD 11/11/2023 1:07:31 PM This report has been signed electronically. Number of Addenda: 0

## 2023-11-11 NOTE — Transfer of Care (Signed)
 Immediate Anesthesia Transfer of Care Note  Patient: Daisy [name]  Procedure(s) Performed: COLONOSCOPY WITH PROPOFOL  ESOPHAGOGASTRODUODENOSCOPY (EGD) WITH PROPOFOL  MALONEY DILATION BIOPSY  Patient Location: Short Stay  Anesthesia Type:General  Level of Consciousness: drowsy  Airway & Oxygen Therapy: Patient Spontanous Breathing  Post-op Assessment: Report given to RN and Post -op Vital signs reviewed and stable  Post vital signs: Reviewed and stable  Last Vitals:  Vitals Value Taken Time  BP    Temp    Pulse    Resp    SpO2      Last Pain:  Vitals:   11/11/23 1213  PainSc: 5          Complications: No notable events documented.

## 2023-11-11 NOTE — Op Note (Addendum)
 [facility] Patient Name: Daisy [name] Procedure Date: 11/11/2023 11:29 AM MRN: [id number] Date of Birth: 07/10/1961 Attending MD: Daisy [name] , MD, [id number] CSN: [id number] Age: 63 Admit Type: Outpatient Procedure:                Upper GI endoscopy Indications:              Epigastric abdominal pain, Dysphagia Providers:                Daisy Ozell Hollingshead, MD, Devere Lodge Referring MD:              Medicines:                Propofol  per Anesthesia Complications:            No immediate complications. Estimated Blood Loss:     Estimated blood loss was minimal. Estimated blood                            loss was minimal. Procedure:                Pre-Anesthesia Assessment:                           - Prior to the procedure, a History and Physical                            was performed, and patient medications and                            allergies were reviewed. The patient's tolerance of                            previous anesthesia was also reviewed. The risks                            and benefits of the procedure and the sedation                            options and risks were discussed with the patient.                            All questions were answered, and informed consent                            was obtained. Prior Anticoagulants: The patient has                            taken no anticoagulant or antiplatelet agents. ASA                            Grade Assessment: III - A patient with severe                            systemic disease. After reviewing the risks and  benefits, the patient was deemed in satisfactory                            condition to undergo the procedure.                           After obtaining informed consent, the endoscope was                            passed under direct vision. Throughout the                            procedure, the patient's blood pressure, pulse, and                             oxygen saturations were monitored continuously. The                            [id number]-[id number] ([id number] scope was introduced through the                            mouth, and advanced to the second part of duodenum.                            The upper GI endoscopy was accomplished without                            difficulty. The patient tolerated the procedure                            well. Scope In: 12:19:03 PM Scope Out: 12:28:49 PM Total Procedure Duration: 0 hours 9 minutes 46 seconds  Findings:      Circumferential distal esophageal erosions extending up 4 cm from the EG       junction. Tubular esophagus patent throughout its course. Gastric cavity       empty. Scattered diffuse hemorrhagic erosions no frank ulcer or       infiltrating process seen. Pylorus patent examination the bulb second       portion revealed more subtle areas of erosions. Prior to biopsy, scope       withdrawn 54 French Maloney dilator passed to full insertion with mild       resistance. A look back revealed no apparent complication related to       this maneuver. Impression:               - Severe distal esophagitis. Status post [facility]                            dilation. Biopsies taken. Diffuse hemorrhagic                            gastric and proximal duodenal erosions. Status post                            gastric biopsy. Query occult NSAID use Moderate Sedation:  Moderate (conscious) sedation was personally administered by an       anesthesia professional. The following parameters were monitored: oxygen       saturation, heart rate, blood pressure, respiratory rate, EKG, adequacy       of pulmonary ventilation, and response to care. Recommendation:           - Patient has a contact number available for                            emergencies. The signs and symptoms of potential                            delayed complications were discussed with the                            patient. Return  to normal activities tomorrow.                            Written discharge instructions were provided to the                            patient.                           - Advance diet as tolerated.                           - Continue present medications. Stop omeprazole;                            begin pantoprazole  40 mg twice daily 30 minutes                            before meals follow-up on pathology.                           -Office visit in 1 month. See colonoscopy report. Procedure Code(s):        --- Professional ---                           [phone], Esophagogastroduodenoscopy, flexible,                            transoral; diagnostic, including collection of                            specimen(s) by brushing or washing, when performed                            (separate procedure) Diagnosis Code(s):        --- Professional ---                           R10.13, Epigastric pain                           R13.10,  Dysphagia, unspecified CPT copyright 2022 American Medical Association. All rights reserved. The codes documented in this report are preliminary and upon coder review may  be revised to meet current compliance requirements. Daisy HERO. Daisy Vanstone, MD Daisy Ozell Hollingshead, MD 11/11/2023 1:02:07 PM This report has been signed electronically. Number of Addenda: 0

## 2023-11-11 NOTE — Anesthesia Postprocedure Evaluation (Signed)
 Anesthesia Post Note  Patient: Daisy [name]  Procedure(s) Performed: COLONOSCOPY WITH PROPOFOL  ESOPHAGOGASTRODUODENOSCOPY (EGD) WITH PROPOFOL  MALONEY DILATION BIOPSY  Patient location during evaluation: PACU Anesthesia Type: General Level of consciousness: awake and alert Pain management: pain level controlled Vital Signs Assessment: post-procedure vital signs reviewed and stable Respiratory status: spontaneous breathing, nonlabored ventilation, respiratory function stable and patient connected to nasal cannula oxygen Cardiovascular status: blood pressure returned to baseline and stable Postop Assessment: no apparent nausea or vomiting Anesthetic complications: no   There were no known notable events for this encounter.   Last Vitals:  Vitals:   11/11/23 1115 11/11/23 1259  BP:  (!) 187/66  Pulse: 70 83  Resp: 15 12  Temp:  (!) 36.4 C  SpO2: 99% 100%    Last Pain:  Vitals:   11/11/23 1259  PainSc: 0-No pain                 Dewaine Morocho L Flossie Wexler

## 2023-11-11 NOTE — Anesthesia Preprocedure Evaluation (Signed)
 Anesthesia Evaluation  Patient identified by MRN, date of birth, ID band Patient awake    Reviewed: Allergy & Precautions, NPO status , Patient's Chart, lab work & pertinent test results  Airway Mallampati: II  TM Distance: >3 FB Neck ROM: Limited    Dental  (+) Dental Advisory Given, Lower Dentures, Upper Dentures   Pulmonary Patient abstained from smoking., former smoker   Pulmonary exam normal breath sounds clear to auscultation       Cardiovascular negative cardio ROS Normal cardiovascular exam Rhythm:Regular Rate:Normal     Neuro/Psych  Headaches PSYCHIATRIC DISORDERS Anxiety Depression Bipolar Disorder   Cervical stenosis   Neuromuscular disease    GI/Hepatic negative GI ROS, Neg liver ROS,,,  Endo/Other  Hypothyroidism  Obesity   Renal/GU CRFRenal diseaseStage 3a CKD but labs not bad now     Musculoskeletal  (+) Arthritis ,  Fibromyalgia -, narcotic dependent  Abdominal   Peds  Hematology negative hematology ROS (+)   Anesthesia Other Findings Day of surgery medications reviewed with the patient.  Systemic inflammatory response syndrome  Reproductive/Obstetrics                             Anesthesia Physical Anesthesia Plan  ASA: 3  Anesthesia Plan: General   Post-op Pain Management: Minimal or no pain anticipated   Induction: Intravenous  PONV Risk Score and Plan: 3 and Propofol  infusion  Airway Management Planned: Nasal Cannula and Natural Airway  Additional Equipment: None  Intra-op Plan:   Post-operative Plan: Extubation in OR  Informed Consent: I have reviewed the patients History and Physical, chart, labs and discussed the procedure including the risks, benefits and alternatives for the proposed anesthesia with the patient or authorized representative who has indicated his/her understanding and acceptance.     Dental advisory given  Plan Discussed with:  CRNA  Anesthesia Plan Comments: (2nd PIV after induction )       Anesthesia Quick Evaluation

## 2023-11-12 ENCOUNTER — Encounter: Payer: Self-pay | Admitting: Internal Medicine

## 2023-11-12 ENCOUNTER — Encounter (HOSPITAL_COMMUNITY): Payer: Self-pay | Admitting: Internal Medicine

## 2023-11-12 ENCOUNTER — Telehealth: Payer: Self-pay

## 2023-11-12 LAB — SURGICAL PATHOLOGY

## 2023-11-12 MED ORDER — PANTOPRAZOLE SODIUM 40 MG PO TBEC
40.0000 mg | DELAYED_RELEASE_TABLET | Freq: Two times a day (BID) | ORAL | 11 refills | Status: AC
Start: 2023-11-12 — End: ?

## 2023-11-12 NOTE — Telephone Encounter (Signed)
-----   Message from Garnette Ka sent at 11/11/2023  1:14 PM EST -----   Stopping omeprazole.  Begin Protonix  40 mg pill twice daily 30 minutes before breakfast and supper.  Dispense 60 with 11 refills.  Thanks.

## 2023-11-12 NOTE — Telephone Encounter (Signed)
 Rx sent to pharmacy on file.

## 2023-11-19 ENCOUNTER — Encounter: Payer: Self-pay | Admitting: Gastroenterology

## 2023-11-19 DIAGNOSIS — Z6834 Body mass index (BMI) 34.0-34.9, adult: Secondary | ICD-10-CM | POA: Diagnosis not present

## 2023-11-19 DIAGNOSIS — M47892 Other spondylosis, cervical region: Secondary | ICD-10-CM | POA: Diagnosis not present

## 2023-11-19 DIAGNOSIS — M4722 Other spondylosis with radiculopathy, cervical region: Secondary | ICD-10-CM | POA: Diagnosis not present

## 2023-12-08 ENCOUNTER — Encounter: Payer: Self-pay | Admitting: Family Medicine

## 2023-12-09 ENCOUNTER — Ambulatory Visit (HOSPITAL_COMMUNITY)

## 2023-12-09 ENCOUNTER — Other Ambulatory Visit (HOSPITAL_COMMUNITY): Payer: Self-pay | Admitting: Family Medicine

## 2023-12-09 DIAGNOSIS — Z1231 Encounter for screening mammogram for malignant neoplasm of breast: Secondary | ICD-10-CM

## 2023-12-11 ENCOUNTER — Inpatient Hospital Stay: Payer: Medicaid Other | Attending: Hematology

## 2023-12-11 DIAGNOSIS — F17210 Nicotine dependence, cigarettes, uncomplicated: Secondary | ICD-10-CM | POA: Insufficient documentation

## 2023-12-11 DIAGNOSIS — K209 Esophagitis, unspecified without bleeding: Secondary | ICD-10-CM | POA: Insufficient documentation

## 2023-12-11 DIAGNOSIS — Z79899 Other long term (current) drug therapy: Secondary | ICD-10-CM | POA: Insufficient documentation

## 2023-12-11 DIAGNOSIS — D509 Iron deficiency anemia, unspecified: Secondary | ICD-10-CM | POA: Diagnosis present

## 2023-12-11 DIAGNOSIS — E611 Iron deficiency: Secondary | ICD-10-CM

## 2023-12-11 LAB — CBC WITH DIFFERENTIAL/PLATELET
Abs Immature Granulocytes: 0.1 10*3/uL — ABNORMAL HIGH (ref 0.00–0.07)
Basophils Absolute: 0.1 10*3/uL (ref 0.0–0.1)
Basophils Relative: 1 %
Eosinophils Absolute: 0.2 10*3/uL (ref 0.0–0.5)
Eosinophils Relative: 2 %
HCT: 46.6 % — ABNORMAL HIGH (ref 36.0–46.0)
Hemoglobin: 15.4 g/dL — ABNORMAL HIGH (ref 12.0–15.0)
Immature Granulocytes: 1 %
Lymphocytes Relative: 20 %
Lymphs Abs: 1.9 10*3/uL (ref 0.7–4.0)
MCH: 29.4 pg (ref 26.0–34.0)
MCHC: 33 g/dL (ref 30.0–36.0)
MCV: 89.1 fL (ref 80.0–100.0)
Monocytes Absolute: 0.6 10*3/uL (ref 0.1–1.0)
Monocytes Relative: 6 %
Neutro Abs: 6.8 10*3/uL (ref 1.7–7.7)
Neutrophils Relative %: 70 %
Platelets: 226 10*3/uL (ref 150–400)
RBC: 5.23 MIL/uL — ABNORMAL HIGH (ref 3.87–5.11)
RDW: 14.8 % (ref 11.5–15.5)
WBC: 9.7 10*3/uL (ref 4.0–10.5)
nRBC: 0 % (ref 0.0–0.2)

## 2023-12-11 LAB — IRON AND TIBC
Iron: 58 ug/dL (ref 28–170)
Saturation Ratios: 20 % (ref 10.4–31.8)
TIBC: 291 ug/dL (ref 250–450)
UIBC: 233 ug/dL

## 2023-12-11 LAB — COMPREHENSIVE METABOLIC PANEL
ALT: 19 U/L (ref 0–44)
AST: 18 U/L (ref 15–41)
Albumin: 4 g/dL (ref 3.5–5.0)
Alkaline Phosphatase: 71 U/L (ref 38–126)
Anion gap: 12 (ref 5–15)
BUN: 21 mg/dL (ref 8–23)
CO2: 24 mmol/L (ref 22–32)
Calcium: 9.3 mg/dL (ref 8.9–10.3)
Chloride: 102 mmol/L (ref 98–111)
Creatinine, Ser: 1.25 mg/dL — ABNORMAL HIGH (ref 0.44–1.00)
GFR, Estimated: 49 mL/min — ABNORMAL LOW (ref >60)
Glucose, Bld: 129 mg/dL — ABNORMAL HIGH (ref 70–99)
Potassium: 4.2 mmol/L (ref 3.5–5.1)
Sodium: 138 mmol/L (ref 135–145)
Total Bilirubin: 0.2 mg/dL (ref 0.0–1.2)
Total Protein: 7.5 g/dL (ref 6.5–8.1)

## 2023-12-11 LAB — FERRITIN: Ferritin: 196 ng/mL (ref 11–307)

## 2023-12-14 ENCOUNTER — Ambulatory Visit (HOSPITAL_COMMUNITY)
Admission: RE | Admit: 2023-12-14 | Discharge: 2023-12-14 | Disposition: A | Discharge status: Home / Self Care | Source: Ambulatory Visit | Attending: Family Medicine | Admitting: Family Medicine

## 2023-12-14 ENCOUNTER — Encounter (HOSPITAL_COMMUNITY): Payer: Self-pay

## 2023-12-14 DIAGNOSIS — Z1231 Encounter for screening mammogram for malignant neoplasm of breast: Secondary | ICD-10-CM | POA: Insufficient documentation

## 2023-12-18 ENCOUNTER — Inpatient Hospital Stay: Payer: Medicaid Other | Admitting: Oncology

## 2023-12-18 VITALS — BP 133/108 | HR 105 | Temp 98.2°F | Resp 19 | Ht 62.0 in | Wt 184.1 lb

## 2023-12-18 DIAGNOSIS — F17200 Nicotine dependence, unspecified, uncomplicated: Secondary | ICD-10-CM | POA: Diagnosis not present

## 2023-12-18 DIAGNOSIS — K209 Esophagitis, unspecified without bleeding: Secondary | ICD-10-CM | POA: Diagnosis not present

## 2023-12-18 DIAGNOSIS — D509 Iron deficiency anemia, unspecified: Secondary | ICD-10-CM

## 2023-12-18 NOTE — Assessment & Plan Note (Signed)
 Patient has recent evidence of esophagitis on endoscopy.  Likely causing iron deficiency. -Continue to follow with GI

## 2023-12-18 NOTE — Assessment & Plan Note (Signed)
 Iron deficiency anemia likely secondary to GI bleeding-recent endoscopy with evidence of severe esophagitis.  No blood in stools or melena at this time.  Improved iron labs and fatigue in patient. -Iron deficiency anemia resolved at this time  No other hematological needs at this time.  Please continue to follow with primary care Dr. Gerda Diss.  Recommended patient to reach out to [place] in future if she has any questions or concerns.

## 2023-12-18 NOTE — Progress Notes (Signed)
 [facility] HEMATOLOGY FOLLOW-UP VISIT  Babs Sciara, MD  REASON FOR FOLLOW-UP: Iron deficiency  ASSESSMENT & PLAN:  Patient is a 63 year old female following for iron deficiency anemia.    IDA (iron deficiency anemia) Iron deficiency anemia likely secondary to GI bleeding-recent endoscopy with evidence of severe esophagitis.  No blood in stools or melena at this time.  Improved iron labs and fatigue in patient. -Iron deficiency anemia resolved at this time  No other hematological needs at this time.  Please continue to follow with primary care Dr. Gerda Diss.  Recommended patient to reach out to [place] in future if she has any questions or concerns.  Smoking Patient reports reduction to half a pack per day. -Encourage continued smoking cessation efforts.  Esophagitis Patient has recent evidence of esophagitis on endoscopy.  Likely causing iron deficiency. -Continue to follow with GI  Patient discharged at this time to follow-up with primary care for further needs.  The total time spent in the appointment was 20 minutes encounter with patients including review of chart and various tests results, discussions about plan of care and coordination of care plan   All questions were answered. The patient knows to call the clinic with any problems, questions or concerns. No barriers to learning was detected.  Cindie Crumbly, MD 3/14/20252:07 PM   SUMMARY OF HEMATOLOGIC HISTORY: Patient with iron deficiency without anemia. -S/p IV Venofer 400 mg X 4 doses   INTERVAL HISTORY: GUILIANNA MCKOY 63 y.o. female is here for follow-up for iron deficiency.patient reports ongoing nausea at this time and relates it to her esophagitis. She reports waking up every morning feeling sick to her stomach and generally not feeling well. She has been trying to quit smoking cigarettes, but it is unclear if this is contributing to her symptoms.She reports waking up every  morning feeling sick to her stomach and generally not feeling well. She has been trying to quit smoking cigarettes, but it is unclear if this is contributing to her symptoms.  She continues to report fatigue even after treating iron deficiency.  She recently underwent an endoscopy and colonoscopy, which revealed inflammation in the esophagus. She was taking ibuprofen for arthritis pain in her neck, hands, and feet, but has been advised to stop due to the inflammation. She is currently taking tylenol  for pain, but it is unclear if it is providing relief.   I have reviewed the past medical history, past surgical history, social history and family history with the patient   ALLERGIES:  has no known allergies.  MEDICATIONS:  Current Outpatient Medications  Medication Sig Dispense Refill   amLODipine (NORVASC) 2.5 MG tablet Take 1 tablet (2.5 mg total) by mouth daily. 90 tablet 1   escitalopram (LEXAPRO) 20 MG tablet Take 1 tablet by mouth once daily 90 tablet 1   ferrous sulfate 325 (65 FE) MG EC tablet Take 1 tablet (325 mg total) by mouth every other day. 45 tablet 3   methocarbamol (ROBAXIN) 500 MG tablet Take 500 mg by mouth 2 (two) times daily as needed for muscle spasms.     naloxone (NARCAN) nasal spray 4 mg/0.1 mL Place 1 spray into the nose as needed (opioid reversal). (Patient not taking: Reported on 09/22/2023)     oxyCODONE (OXY IR/ROXICODONE) 5 MG immediate release tablet Take 5 mg by mouth 3 (three) times daily. (Patient not taking: Reported on 09/25/2023)     pantoprazole (PROTONIX) 40 MG tablet Take 1  tablet (40 mg total) by mouth 2 (two) times daily before a meal. 60 tablet 11   Vitamin D, Ergocalciferol, (DRISDOL) 1.25 MG (50000 UNIT) CAPS capsule Take 1 capsule (50,000 Units total) by mouth every 7 (seven) days. 4 capsule 2   No current facility-administered medications for this visit.     REVIEW OF SYSTEMS:   Constitutional: Denies fevers, chills or night sweats Eyes:  Denies blurriness of vision Ears, nose, mouth, throat, and face: Denies mucositis or sore throat Respiratory: Denies cough, dyspnea or wheezes Cardiovascular: Denies palpitation, chest discomfort or lower extremity swelling Gastrointestinal:  Denies nausea, heartburn or change in bowel habits Skin: Denies abnormal skin rashes Lymphatics: Denies new lymphadenopathy or easy bruising Neurological:Denies numbness, tingling or new weaknesses Behavioral/Psych: Mood is stable, no new changes  All other systems were reviewed with the patient and are negative.  PHYSICAL EXAMINATION:   Vitals:   12/18/23 0940  BP: (!) 133/108  Pulse: (!) 105  Resp: 19  Temp: 98.2 F (36.8 C)  SpO2: 96%    GENERAL:alert, no distress and comfortable SKIN: skin color, texture, turgor are normal, no rashes or significant lesions LUNGS: clear to auscultation and percussion with normal breathing effort HEART: regular rate & rhythm and no murmurs and no lower extremity edema ABDOMEN:abdomen soft, non-tender and normal bowel sounds Musculoskeletal:no cyanosis of digits and no clubbing  NEURO: alert & oriented x 3 with fluent speech  LABORATORY DATA:  I have reviewed the data as listed  Lab Results  Component Value Date   WBC 9.7 12/11/2023   NEUTROABS 6.8 12/11/2023   HGB 15.4 (H) 12/11/2023   HCT 46.6 (H) 12/11/2023   MCV 89.1 12/11/2023   PLT 226 12/11/2023      Chemistry      Component Value Date/Time   NA 138 12/11/2023 0923   NA 140 07/09/2023 1026   K 4.2 12/11/2023 0923   CL 102 12/11/2023 0923   CO2 24 12/11/2023 0923   BUN 21 12/11/2023 0923   BUN 17 07/09/2023 1026   CREATININE 1.25 (H) 12/11/2023 0923      Component Value Date/Time   CALCIUM 9.3 12/11/2023 0923   ALKPHOS 71 12/11/2023 0923   AST 18 12/11/2023 0923   ALT 19 12/11/2023 0923   BILITOT 0.2 12/11/2023 0923   BILITOT <0.2 03/06/2023 0923      Latest Reference Range & Units 12/11/23 09:22  Iron 28 - 170 ug/dL 58   UIBC ug/dL 784  TIBC 696 - 295 ug/dL 284  Saturation Ratios 10.4 - 31.8 % 20  Ferritin 11 - 307 ng/mL 196    Colonoscopy:11/11/23:  Impression:  - Non- bleeding external and internal hemorrhoids. - Diverticulosis in the entire examined colon.  - The examination was otherwise normal on direct and retroflexion views.  - No specimens collected. Suspect benign anorectal bleeding.  Endoscopy: 11/11/23: Impression:  -Severe distal esophagitis.  -Status post Maloney dilation.  -Biopsies taken.  -Diffuse hemorrhagic gastric and proximal duodenal erosions.  -Status post gastric biopsy.  -Query occult NSAID use  Pathology:  FINAL MICROSCOPIC DIAGNOSIS:   A. STOMACH, BIOPSY:       Gastric antral mucosa with ulcer and purulent exudates.       No H. pylori identified on HE stain.       Negative for intestinal metaplasia or dysplasia.   B. DISTAL, ESOPHAGEAL, BIOPSY:       Esophageal squamous mucosa with no significant diagnostic  alteration.      No  evidence of intraepithelial eosinophils or lymphocytes.

## 2023-12-18 NOTE — Patient Instructions (Signed)
 VISIT SUMMARY:  During today's visit, we discussed your ongoing symptoms of nausea, headaches, and fatigue. We reviewed the results of your recent endoscopy and colonoscopy, which showed inflammation in your esophagus. We also talked about your efforts to quit smoking and your current pain management for arthritis.  YOUR PLAN:  -IRON DEFICIENCY: Your iron labs are normal today and you are not anemic anymore. you don't need anymore IV iron infusions. NO hematology needs at this time. Continue to follow with your primary care Dr.Luking  -ESOPHAGITIS: Esophagitis is inflammation of the esophagus, likely caused by your previous use of ibuprofen. You should stop taking ibuprofen to help reduce the inflammation.  INSTRUCTIONS:  Please follow up with Doctor Gerda Diss for further evaluation of your nausea and headaches, as well as to discuss alternative pain management strategies for your osteoarthritis. Continue to avoid ibuprofen to help reduce esophageal inflammation.

## 2023-12-18 NOTE — Assessment & Plan Note (Signed)
 Patient reports reduction to half a pack per day. -Encourage continued smoking cessation efforts.

## 2023-12-22 ENCOUNTER — Ambulatory Visit (INDEPENDENT_AMBULATORY_CARE_PROVIDER_SITE_OTHER): Admitting: Internal Medicine

## 2023-12-22 ENCOUNTER — Encounter: Payer: Self-pay | Admitting: Internal Medicine

## 2023-12-22 VITALS — BP 128/77 | HR 73 | Temp 98.0°F | Ht 63.0 in | Wt 187.0 lb

## 2023-12-22 DIAGNOSIS — K2100 Gastro-esophageal reflux disease with esophagitis, without bleeding: Secondary | ICD-10-CM

## 2023-12-22 DIAGNOSIS — D509 Iron deficiency anemia, unspecified: Secondary | ICD-10-CM

## 2023-12-22 DIAGNOSIS — R110 Nausea: Secondary | ICD-10-CM | POA: Diagnosis not present

## 2023-12-22 DIAGNOSIS — Z8719 Personal history of other diseases of the digestive system: Secondary | ICD-10-CM

## 2023-12-22 DIAGNOSIS — F1290 Cannabis use, unspecified, uncomplicated: Secondary | ICD-10-CM

## 2023-12-22 DIAGNOSIS — K219 Gastro-esophageal reflux disease without esophagitis: Secondary | ICD-10-CM | POA: Diagnosis not present

## 2023-12-22 MED ORDER — ONDANSETRON 4 MG PO TBDP: 4.0000 mg | ORAL_TABLET | Freq: Three times a day (TID) | ORAL | 3 refills | Status: AC | PRN

## 2023-12-22 NOTE — Progress Notes (Unsigned)
 Primary Care Physician:  Babs Sciara, MD Primary Gastroenterologist:  Dr. Jena Gauss  Pre-Procedure History & Physical: HPI:  Daisy [name] is a 63 y.o. female here for follow-up iron deficiency anemia, GERD.  Recent GI evaluation included colonoscopy which demonstrated severe reflux esophagitis.  Non-H. pylori gastritis.  Findings suspicious for NSAID use. Still with nausea.  Still smokes marijuana. Colonoscopy demonstrated grade 3 hemorrhoids and pancolonic diverticulosis-slated for a screening exam 2035.  Continues marijuana although she is cut back.  Started smoking marijuana after her son passed away.  When she goes to have a bowel movement she has to strain spending at least 15 minutes on the commode.  No fiber supplement. Past Medical History:  Diagnosis Date   Acute renal failure superimposed on stage 3a chronic kidney disease (HCC) 03/30/2021   Anxiety    Bipolar 1 disorder (HCC)    Chronic pain syndrome    Depression    Fatigue    Fibromyalgia    Kidney disease    Stage 3   Normal cardiac stress test 10/06/2000   Plantar fasciitis    PTSD (post-traumatic stress disorder)    Sepsis due to undetermined organism (HCC) 12/17/2021   SIRS (systemic inflammatory response syndrome) (HCC) 03/30/2021   Thyroid disease     Past Surgical History:  Procedure Laterality Date   ANTERIOR CERVICAL DECOMP/DISCECTOMY FUSION N/A 05/22/2023   Procedure: Anterior Cervical Discectomy Fusion - Cervical Four-Cervical Five - Cervical Five-Cervical Six - Cervical Six-Cervical Seven;  Surgeon: Coletta Memos, MD;  Location: [facility] OR;  Service: Neurosurgery;  Laterality: N/A;   APPENDECTOMY     BIOPSY  11/11/2023   Procedure: BIOPSY;  Surgeon: Corbin Ade, MD;  Location: [facility] ENDO SUITE;  Service: Endoscopy;;   CESAREAN SECTION     twice '84 and '89   CHOLECYSTECTOMY     COLONOSCOPY WITH PROPOFOL N/A 11/11/2023   Procedure: COLONOSCOPY WITH PROPOFOL;  Surgeon: Corbin Ade, MD;  Location: [facility]  ENDO SUITE;  Service: Endoscopy;  Laterality: N/A;  12:45 pm, asa 3   ECTOPIC PREGNANCY SURGERY     ESOPHAGOGASTRODUODENOSCOPY (EGD) WITH PROPOFOL N/A 11/11/2023   Procedure: ESOPHAGOGASTRODUODENOSCOPY (EGD) WITH PROPOFOL;  Surgeon: Corbin Ade, MD;  Location: [facility] END[facility] SU[facility];  Service: Endoscopy;  Laterality: N/A;  12:45 pm, asa 3   FINGER SURGERY     HERNIA REPAIR  2004 and 2005   twice  same site after cholecystectomy   MALONEY DILATION N/A 11/11/2023   Procedure: MALONEY DILATION;  Surgeon: Corbin Ade, MD;  Location: [facility] END[facility] SU[facility];  Service: Endoscopy;  Laterality: N/A;  12:45 pm, asa3   NASAL SINUS SURGERY     twice -enlarged sinuses and cleared scar tissue   TONSILLECTOMY      Prior to Admission medications   Medication Sig Start Date End Date Taking? Authorizing Provider  amLODipine (NORVASC) 2.5 MG tablet Take 1 tablet (2.5 mg total) by mouth daily. 08/19/23  Yes Babs Sciara, MD  escitalopram (LEXAPRO) 20 MG tablet Take 1 tablet by mouth once daily 08/19/23  Yes Luking, Jonna Coup, MD  pantoprazole (PROTONIX) 40 MG tablet Take 1 tablet (40 mg total) by mouth 2 (two) times daily before a meal. 11/12/23  Yes Aldyn Toon, Gerrit Friends, MD    Allergies as of 12/22/2023   (No Known Allergies)    Family History  Problem Relation Age of Onset   Diabetes Mother    Heart disease Father    Hypothyroidism Father  Social History   Socioeconomic History   Marital status: Married    Spouse name: Rogers Blocker   Number of children: 1   Years of education: Not on file   Highest education level: Some college, no degree  Occupational History   Not on file  Tobacco Use   Smoking status: Former    Current packs/day: 0.00    Types: Cigarettes    Quit date: 01/28/2023    Years since quitting: 0.8    Passive exposure: Past   Smokeless tobacco: Never  Vaping Use   Vaping status: Never Used  Substance and Sexual Activity   Alcohol use: No   Drug use: Yes    Types: Marijuana    Comment:  Significantly decreasing marijuana use since 01/28/2023 but not in remission yet   Sexual activity: Yes    Birth control/protection: None  Other Topics Concern   Not on file  Social History Narrative   Not on file   Social Drivers of Health   Financial Resource Strain: Medium Risk (08/19/2023)   Overall Financial Resource Strain (CARDIA)    Difficulty of Paying Living Expenses: Somewhat hard  Food Insecurity: No Food Insecurity (08/19/2023)   Hunger Vital Sign    Worried About Running Out of Food in the Last Year: Never true    Ran Out of Food in the Last Year: Never true  Transportation Needs: No Transportation Needs (08/19/2023)   PRAPARE - Administrator, Civil Service (Medical): No    Lack of Transportation (Non-Medical): No  Physical Activity: Insufficiently Active (08/19/2023)   Exercise Vital Sign    Days of Exercise per Week: 1 day    Minutes of Exercise per Session: 30 min  Stress: Stress Concern Present (08/19/2023)   [employer] of Occupational Health - Occupational Stress Questionnaire    Feeling of Stress : Rather much  Social Connections: Moderately Isolated (08/19/2023)   Social Connection and Isolation Panel [NHANES]    Frequency of Communication with Friends and Family: Once a week    Frequency of Social Gatherings with Friends and Family: Never    Attends Religious Services: 1 to 4 times per year    Active Member of [employer] or Organizations: No    Attends Engineer, structural: Not on file    Marital Status: Married  Catering manager Violence: Not At Risk (05/24/2023)   Humiliation, Afraid, Rape, and Kick questionnaire    Fear of Current or Ex-Partner: No    Emotionally Abused: No    Physically Abused: No    Sexually Abused: No    Review of Systems: See HPI, otherwise negative ROS  Physical Exam: BP 128/77 (BP Location: Right Arm, Patient Position: Sitting, Cuff Size: Normal)   Pulse 73   Temp 98 F (36.7 C) (Oral)   Ht 5\' 3"   (1.6 m)   Wt 187 lb (84.8 kg)   SpO2 96%   BMI 33.13 kg/m  General:   Alert,  Well-developed, well-nourished, pleasant and cooperative in NAD Neck:  Supple; no masses or thyromegaly. No significant cervical adenopathy. Lungs:  Clear throughout to auscultation.   No wheezes, crackles, or rhonchi. No acute distress. Heart:  Regular rate and rhythm; no murmurs, clicks, rubs,  or gallops. Abdomen: Non-distended, normal bowel sounds.  Soft and nontender without appreciable mass or hepatosplenomegaly.   Impression/Plan: 63 year old lady with erosive reflux esophagitis ,nausea, pancolonic diverticulosis and hemorrhoids.  Recent iron deficiency anemia has resolved on iron supplementation.  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  Ongoing cannabis likely contributing factor to nausea.  She has a benign abdominal exam today. Discussed the importance of complete cannabis use cessation for nausea to improve.  Recommendations:  Complete cannabis use cessation.    New prescription for Zofran ODT 4 mg tablets dispense 60 with 3 refills  Put 1 under the tongue 3 times a day as needed for nausea  Continue Protonix 40 mg twice daily  Add Metamucil wafers, powder or caplets to regimen every day  Avoid all NSAIDs like BC powders and ibuprofen  Celiac screen (total IgA and TTG IgA)  Office visit with me in 3 months      Notice: This dictation was prepared with Dragon dictation along with smaller phrase technology. Any transcriptional errors that result from this process are unintentional and may not be corrected upon review.

## 2023-12-22 NOTE — Patient Instructions (Signed)
 It was good to see you again today  It is important to abstain from cannabis use entirely to allow your nausea symptoms to subside  New prescription for Zofran ODT 4 mg tablets dispense 60 with 3 refills  Put 1 under the tongue 3 times a day as needed for nausea  Continue Protonix 40 mg twice daily  Add Metamucil wafers, powder or caplets to your regimen every day  Avoid all NSAIDs like BC powders and ibuprofen  Celiac screen (total IgA and TTG IgA)  Office visit with me in 3 months

## 2023-12-24 LAB — TISSUE TRANSGLUTAMINASE, IGA

## 2023-12-24 LAB — IGA: IgA/Immunoglobulin A, Serum: 268 mg/dL (ref 87–352)

## 2024-01-13 ENCOUNTER — Other Ambulatory Visit: Payer: Self-pay

## 2024-01-13 ENCOUNTER — Encounter: Payer: Self-pay | Admitting: Family Medicine

## 2024-01-13 DIAGNOSIS — M25462 Effusion, left knee: Secondary | ICD-10-CM

## 2024-01-13 NOTE — Telephone Encounter (Signed)
 Nurses Please put in urgent referral to [facility] Dr. Romeo Apple, Dr.Cairns should be able to do a good job for this  Diagnosis left knee pain and swelling  Please send [facility] notification that this was initiated-their office should reach out to her

## 2024-01-21 ENCOUNTER — Ambulatory Visit (INDEPENDENT_AMBULATORY_CARE_PROVIDER_SITE_OTHER): Payer: Medicaid Other | Admitting: Family Medicine

## 2024-01-21 ENCOUNTER — Encounter: Payer: Self-pay | Admitting: Family Medicine

## 2024-01-21 VITALS — BP 139/76 | HR 72 | Temp 98.4°F | Ht 63.0 in | Wt 187.0 lb

## 2024-01-21 DIAGNOSIS — M25462 Effusion, left knee: Secondary | ICD-10-CM

## 2024-01-21 DIAGNOSIS — D692 Other nonthrombocytopenic purpura: Secondary | ICD-10-CM

## 2024-01-21 DIAGNOSIS — N1831 Chronic kidney disease, stage 3a: Secondary | ICD-10-CM | POA: Diagnosis not present

## 2024-01-21 DIAGNOSIS — I10 Essential (primary) hypertension: Secondary | ICD-10-CM

## 2024-01-21 MED ORDER — VALSARTAN 40 MG PO TABS: 40.0000 mg | ORAL_TABLET | Freq: Every day | ORAL | 5 refills | Status: DC

## 2024-01-21 MED ORDER — MUPIROCIN 2 % EX OINT: TOPICAL_OINTMENT | CUTANEOUS | 2 refills | Status: AC

## 2024-01-21 NOTE — Progress Notes (Signed)
 Subjective:    Patient ID: Daisy [name], female    DOB: [birth year], 63 y.o.   MRN: [id number]  HPI follow up Anemia and fatigue Discussed the use of AI scribe software for clinical note transcription with the patient, who gave verbal consent to proceed.  History of Present Illness   Daisy [name] is a 63 year old female with chronic joint pain and chronic kidney disease who presents with worsening joint pain and swelling.  She experiences pain primarily in her joints, which is more pronounced in the mornings and makes it difficult for her to get moving. She attempts to alleviate the pain by 'walking it off' and moving around. She has a history of taking oxycodone and hydrocodone for pain but has not used them since January as her neck pain has improved. She currently takes Tylenol for pain management.  She has esophagitis, diagnosed after a scope test in February, and was prescribed Protonix twice a day, which has improved her symptoms. She continues to experience some nausea, which is improving.  Her creatinine levels have been slightly elevated, with a reading of 1.2 in March, indicating chronic kidney disease. She avoids anti-inflammatories like ibuprofen to prevent further kidney damage.  She experiences swelling and pain in her knee, which has been severe enough to triple in size at times. She reports difficulty squatting and walking due to the pain. She has fallen on this knee multiple times, and it remains sore. She is awaiting a call from orthopedics for further evaluation.  She describes a lack of desire to go out and engage in activities she previously enjoyed, such as gardening. She feels overwhelmed and panicked at the thought of leaving the house, which has been a long-standing issue. She has not been in counseling recently due to difficulty attending appointments.  She has a new kitten that causes her skin to blister upon contact, which her daughter suggested might be cat  scratch fever. She notes that her skin has become thinner with age, making it more susceptible to injury.       Review of Systems     Objective:   Physical Exam  General-in no acute distress Eyes-no discharge Lungs-respiratory rate normal, CTA CV-no murmurs,RRR Extremities skin warm dry no edema Neuro grossly normal Behavior normal, alert Left knee pain discomfort with movement crepitus noted      Assessment & Plan:  1. Primary hypertension (Primary) Blood pressure decent control but we need to change blood pressure medicine to help her CKD I recommend valsartan Stop amlodipine Blood pressure recheck in 2 to 3 weeks time Metabolic 7 within 7 to 10 days Follow-up office visit within 5 months to 6 months - Basic metabolic panel with GFR  2. Swelling of left knee Will send notification to orthopedics so they can go ahead and set her up for an office visit  3. Senile purpura (HCC) Patient has senile purpura on her arms she has thin skin, she has a cat at home that frequently scratches her, may use Bactroban ointment twice daily when scratches get infected We did discuss cellulitis as well as cat bites and the importance of being seen for those issues  4. Stage 3a chronic kidney disease (HCC) Lab work along with urine 7 to 10 days after initiating valsartan For now does not need to see nephrology but certainly if her condition worsens over the next year may need to have in that direction Healthy diet discussed Avoiding all NSAIDs discussed Keeping blood  pressure under control discussed Utilizing valsartan instead of amlodipine  Patient does have a lot of arthralgias so far testing negative.  Using Tylenol for discomfort - Microalbumin / creatinine urine ratio  Assessment and Plan    Chronic Kidney Disease (CKD) CKD with elevated creatinine likely due to age, hypertension, and NSAID use. Current blood pressure management with amlodipine is suboptimal. Valsartan  recommended for renal protection and blood pressure control. - Discontinue amlodipine. - Start valsartan at the lowest dose. - Monitor blood pressure and renal function with blood and urine tests in 7-10 days. - Follow up with PA in a few weeks to check blood pressure. - Educate on avoiding NSAIDs to prevent CKD progression.  Hypertension Blood pressure management suboptimal with amlodipine. Valsartan recommended for better control and renal protection. - Discontinue amlodipine. - Start valsartan at the lowest dose. - Monitor blood pressure and renal function with blood and urine tests in 7-10 days. - Follow up with PA in a few weeks to check blood pressure.  Esophagitis Esophagitis likely due to NSAID use. Protonix improved symptoms. Avoiding NSAIDs crucial to prevent further irritation. - Continue Protonix twice daily. - Avoid NSAIDs to prevent further esophageal irritation.  Chronic Joint Pain Chronic joint pain affecting mobility, improved with movement. Prefers acetaminophen and regular movement for pain management. - Continue using acetaminophen for pain management. - Encourage regular movement and exercise to alleviate stiffness.  Osteoarthritis of the Knee Knee pain and swelling likely due to osteoarthritis. Discussed potential for steroid injection for relief without affecting renal function. - Send message to orthopedics to follow up on referral. - Consider steroid injection for knee pain management.  Depression and Anxiety Reports social withdrawal and panic. No current counseling. Virtual visits suggested. Discussed benefits of engaging in enjoyable activities. - Refer to behavioral health specialist for virtual counseling sessions. - Encourage social engagement and activities that she enjoys.  Cat Scratch Disease Experiences skin reactions to cat scratches, no systemic symptoms. Thin skin due to age may contribute to injuries. Advised on preventive measures and signs  of infection. - Prescribe antibiotic ointment for use on scratches. - Advise on wearing long sleeves to prevent scratches. - Educate on signs of cellulitis and when to seek medical attention.  General Health Maintenance Recent mammogram and colonoscopy normal. Advised to avoid NSAIDs for esophageal and renal health. - Continue regular health screenings as recommended. - Avoid NSAIDs.

## 2024-02-02 DIAGNOSIS — N1831 Chronic kidney disease, stage 3a: Secondary | ICD-10-CM | POA: Diagnosis not present

## 2024-02-02 DIAGNOSIS — I10 Essential (primary) hypertension: Secondary | ICD-10-CM | POA: Diagnosis not present

## 2024-02-03 ENCOUNTER — Encounter: Payer: Self-pay | Admitting: Family Medicine

## 2024-02-03 LAB — MICROALBUMIN / CREATININE URINE RATIO
Creatinine, Urine: 292.9 mg/dL
Microalb/Creat Ratio: 15 mg/g{creat} (ref 0–29)
Microalbumin, Urine: 42.9 ug/mL

## 2024-02-03 LAB — BASIC METABOLIC PANEL WITH GFR
BUN/Creatinine Ratio: 19 (ref 12–28)
BUN: 26 mg/dL (ref 8–27)
CO2: 22 mmol/L (ref 20–29)
Calcium: 9.3 mg/dL (ref 8.7–10.3)
Chloride: 105 mmol/L (ref 96–106)
Creatinine, Ser: 1.34 mg/dL — ABNORMAL HIGH (ref 0.57–1.00)
Glucose: 104 mg/dL — ABNORMAL HIGH (ref 70–99)
Potassium: 4.6 mmol/L (ref 3.5–5.2)
Sodium: 141 mmol/L (ref 134–144)
eGFR: 45 mL/min/{1.73_m2} — ABNORMAL LOW (ref >59)

## 2024-02-05 ENCOUNTER — Other Ambulatory Visit: Payer: Self-pay

## 2024-02-05 DIAGNOSIS — R7989 Other specified abnormal findings of blood chemistry: Secondary | ICD-10-CM

## 2024-02-08 ENCOUNTER — Other Ambulatory Visit (INDEPENDENT_AMBULATORY_CARE_PROVIDER_SITE_OTHER): Payer: Self-pay

## 2024-02-08 ENCOUNTER — Encounter: Payer: Self-pay | Admitting: Orthopedic Surgery

## 2024-02-08 ENCOUNTER — Ambulatory Visit: Admitting: Orthopedic Surgery

## 2024-02-08 VITALS — BP 178/119 | HR 70 | Ht 62.0 in | Wt 184.0 lb

## 2024-02-08 DIAGNOSIS — M25562 Pain in left knee: Secondary | ICD-10-CM

## 2024-02-08 DIAGNOSIS — M1712 Unilateral primary osteoarthritis, left knee: Secondary | ICD-10-CM | POA: Diagnosis not present

## 2024-02-08 MED ORDER — METHYLPREDNISOLONE ACETATE 40 MG/ML IJ SUSP
40.0000 mg | Freq: Once | INTRAMUSCULAR | Status: AC
  Administered 2024-02-08: 40 mg via INTRA_ARTICULAR

## 2024-02-08 NOTE — Progress Notes (Signed)
 Chief Complaint  Patient presents with   Knee Pain    Left    63 year old female presents with left knee pain and swelling  She has kidney issue cannot take NSAIDs and her primary care doctor recommended she try injections in her knee  She complains of pain and swelling left knee She reports the pain is diffuse  She denies any hip or back pain no radiation  She does get some pain on the medial shin but this is within the realm of medial arthritis   Problem list, medical hx, medications and allergies reviewed     Physical Exam Vitals and nursing note reviewed.  Constitutional:      Appearance: Normal appearance.  HENT:     Head: Normocephalic and atraumatic.  Eyes:     General: No scleral icterus.       Right eye: No discharge.        Left eye: No discharge.     Extraocular Movements: Extraocular movements intact.     Conjunctiva/sclera: Conjunctivae normal.     Pupils: Pupils are equal, round, and reactive to light.  Cardiovascular:     Rate and Rhythm: Normal rate.     Pulses: Normal pulses.  Skin:    General: Skin is warm and dry.     Capillary Refill: Capillary refill takes less than 2 seconds.  Neurological:     General: No focal deficit present.     Mental Status: She is alert and oriented to person, place, and time.  Psychiatric:        Mood and Affect: Mood normal.        Behavior: Behavior normal.        Thought Content: Thought content normal.        Judgment: Judgment normal.     Left knee exam shows a swollen knee joint tender over the medial and lateral joint line Seems to have a little loss of extension the flexion arc is 120 degrees  Knee is stable  DG Knee AP/LAT W/Sunrise Left Result Date: 02/08/2024 Imaging of the left knee overall alignment is still valgus she has medial sloping of the tibia and femoral joint lines as a phenotype If pressed I would say the medial joint line is narrowed No osteophytes are seen Patellofemoral alignment is  relatively normal OA left knee mild     Assessment and plan  Osteoarthritis of the left knee  63 years old, kidney issues prevent anti-inflammatories   Agree injection may help She should also take extra strength Tylenol  Maintain a good weight Light to moderate exercise including possibility of therapy Use topical medications  Surgery is not indicated at this point  May return for injection in 6 months if needed   Procedure note left knee injection   verbal consent was obtained to inject left knee joint  Timeout was completed to confirm the site of injection  The medications used were depomedrol 40 mg and 1% lidocaine  3 cc Anesthesia was provided by ethyl chloride and the skin was prepped with alcohol.  After cleaning the skin with alcohol a 20-gauge needle was used to inject the left knee joint. There were no complications. A sterile bandage was applied.

## 2024-02-08 NOTE — Progress Notes (Signed)
  Intake history:  BP (!) 178/119   Pulse 70   Ht 5\' 2"  (1.575 m)   Wt 184 lb (83.5 kg)   BMI 33.65 kg/m  Body mass index is 33.65 kg/m.    WHAT ARE WE SEEING YOU FOR TODAY?   left knee(s)  How long has this bothered you? (DOI?DOS?WS?)  For couple weeks   Anticoag.  No  Diabetes No  Heart disease No  Hypertension Yes  SMOKING HX No  Kidney disease Yes  Any ALLERGIES ______________________________________________   Treatment:  Have you taken:  Tylenol  Yes  Advil No  Had PT No  Had injection No  Other  ______________ice heat and muscle rubs ___________

## 2024-02-08 NOTE — Addendum Note (Signed)
 Addended byArla Lab on: 02/08/2024 10:47 AM   Modules accepted: Orders

## 2024-02-09 ENCOUNTER — Encounter: Payer: Self-pay | Admitting: Internal Medicine

## 2024-02-18 ENCOUNTER — Encounter: Payer: Self-pay | Admitting: Physician Assistant

## 2024-02-18 ENCOUNTER — Ambulatory Visit (INDEPENDENT_AMBULATORY_CARE_PROVIDER_SITE_OTHER): Admitting: Physician Assistant

## 2024-02-18 ENCOUNTER — Encounter: Payer: Self-pay | Admitting: Family Medicine

## 2024-02-18 VITALS — BP 119/85 | HR 100 | Temp 97.5°F | Ht 62.0 in | Wt 181.2 lb

## 2024-02-18 DIAGNOSIS — I10 Essential (primary) hypertension: Secondary | ICD-10-CM | POA: Insufficient documentation

## 2024-02-18 DIAGNOSIS — R630 Anorexia: Secondary | ICD-10-CM | POA: Diagnosis not present

## 2024-02-18 NOTE — Progress Notes (Signed)
   Established Patient Office Visit  Subjective   Patient ID: Daisy [name], female    DOB: 1961-08-14  Age: 63 y.o. MRN: [id number]  No chief complaint on file.   Patient presents today for follow up regarding hypertension. She reports overall feeling well but reports continued arthralgias, headaches, and decreased appetite. Currently on valsartan  and states daily compliance with medications. Patient denies blurry vision, dizziness, chest pain, shortness of breath, or palpitations.      Review of Systems  Constitutional:  Positive for malaise/fatigue. Negative for fever and weight loss.  Respiratory:  Negative for cough and shortness of breath.   Cardiovascular:  Negative for chest pain and palpitations.  Gastrointestinal:  Negative for nausea and vomiting.  Neurological:  Positive for headaches.      Objective:     BP 119/85   Pulse 100   Temp (!) 97.5 F (36.4 C)   Ht 5\' 2"  (1.575 m)   Wt 181 lb 3.2 oz (82.2 kg)   SpO2 98%   BMI 33.14 kg/m    Physical Exam Constitutional:      Appearance: Normal appearance.  HENT:     Head: Normocephalic.     Mouth/Throat:     Mouth: Mucous membranes are moist.     Pharynx: Oropharynx is clear.  Eyes:     Extraocular Movements: Extraocular movements intact.     Conjunctiva/sclera: Conjunctivae normal.  Cardiovascular:     Rate and Rhythm: Normal rate and regular rhythm.     Heart sounds: Normal heart sounds. No murmur heard. Pulmonary:     Effort: Pulmonary effort is normal.     Breath sounds: Normal breath sounds. No wheezing.  Musculoskeletal:     Right lower leg: No edema.     Left lower leg: No edema.  Skin:    General: Skin is warm and dry.  Neurological:     General: No focal deficit present.     Mental Status: She is alert and oriented to person, place, and time.  Psychiatric:        Mood and Affect: Mood normal.        Behavior: Behavior normal.    No results found for any visits on 02/18/24.  The  10-year ASCVD risk score (Arnett DK, et al., 2019) is: 5.2%    Assessment & Plan:   Return in about 1 month (around 03/20/2024) for blood pressure/appetite .   Essential hypertension Assessment & Plan: 119/85 Controlled. Continue current medications. No change in management. Discussed DASH diet and dietary sodium restrictions.  Increase dietary efforts and physical activity. BMP today to re-evaluate creatinine.  Orders: -     Basic metabolic panel with GFR  Decreased appetite Assessment & Plan: Patient presents today with concerns for decreased appetite x 1 month. Weight is stable. No red flag symptoms today. Advised small meals, advised protein shakes. Warning signs and return precautions discussed. Patient to follow up in 1 month for re-evaluation.       Jearlean Mince Zykera Abella, PA-C

## 2024-02-18 NOTE — Assessment & Plan Note (Signed)
 119/85 Controlled. Continue current medications. No change in management. Discussed DASH diet and dietary sodium restrictions.  Increase dietary efforts and physical activity. BMP today to re-evaluate creatinine.

## 2024-02-18 NOTE — Assessment & Plan Note (Signed)
 Patient presents today with concerns for decreased appetite x 1 month. Weight is stable. No red flag symptoms today. Advised small meals, advised protein shakes. Warning signs and return precautions discussed. Patient to follow up in 1 month for re-evaluation.

## 2024-02-19 ENCOUNTER — Ambulatory Visit: Payer: Self-pay | Admitting: Physician Assistant

## 2024-02-19 ENCOUNTER — Other Ambulatory Visit: Payer: Self-pay | Admitting: Physician Assistant

## 2024-02-19 DIAGNOSIS — N1831 Chronic kidney disease, stage 3a: Secondary | ICD-10-CM

## 2024-02-19 LAB — BASIC METABOLIC PANEL WITH GFR
BUN/Creatinine Ratio: 12 (ref 12–28)
BUN: 16 mg/dL (ref 8–27)
CO2: 20 mmol/L (ref 20–29)
Calcium: 9.2 mg/dL (ref 8.7–10.3)
Chloride: 99 mmol/L (ref 96–106)
Creatinine, Ser: 1.38 mg/dL — ABNORMAL HIGH (ref 0.57–1.00)
Glucose: 163 mg/dL — ABNORMAL HIGH (ref 70–99)
Potassium: 4.4 mmol/L (ref 3.5–5.2)
Sodium: 138 mmol/L (ref 134–144)
eGFR: 43 mL/min/{1.73_m2} — ABNORMAL LOW (ref >59)

## 2024-03-21 ENCOUNTER — Encounter: Payer: Self-pay | Admitting: Physician Assistant

## 2024-03-21 ENCOUNTER — Ambulatory Visit: Admitting: Physician Assistant

## 2024-03-21 VITALS — BP 142/86 | HR 98 | Temp 97.3°F | Ht 62.0 in | Wt 183.0 lb

## 2024-03-21 DIAGNOSIS — R051 Acute cough: Secondary | ICD-10-CM

## 2024-03-21 DIAGNOSIS — I10 Essential (primary) hypertension: Secondary | ICD-10-CM

## 2024-03-21 DIAGNOSIS — H60501 Unspecified acute noninfective otitis externa, right ear: Secondary | ICD-10-CM | POA: Diagnosis not present

## 2024-03-21 MED ORDER — VALSARTAN 80 MG PO TABS: 80.0000 mg | ORAL_TABLET | Freq: Every day | ORAL | 3 refills | Status: AC

## 2024-03-21 MED ORDER — PROMETHAZINE-DM 6.25-15 MG/5ML PO SYRP: 5.0000 mL | ORAL_SOLUTION | Freq: Four times a day (QID) | ORAL | 0 refills | Status: DC | PRN

## 2024-03-21 MED ORDER — CIPROFLOXACIN-DEXAMETHASONE 0.3-0.1 % OT SUSP
4.0000 [drp] | Freq: Two times a day (BID) | OTIC | 0 refills | Status: AC
Start: 2024-03-21 — End: ?

## 2024-03-21 NOTE — Progress Notes (Signed)
 Established Patient Office Visit  Subjective   Patient ID: Daisy [name], female    DOB: 12/27/1960  Age: 63 y.o. MRN: [id number]  Chief Complaint  Patient presents with   1 month follow up HTN   ear ache, cough - productive    Since Saturday     Patient presents today for follow up regarding hypertension. Currently on valsartan  and states daily compliance with medications. Patient denies headaches, blurry vision, dizziness, chest pain, shortness of breath, or palpitations.  Additionally she complains of right ear pain and productive cough since Saturday. She endorses ear fullness and evening cough. She denies fevers, shortness of breath, wheezing, ear drainage, or decreased hearing. No sick contacts. She overall feels well.       Review of Systems  Constitutional:  Negative for chills, fever and malaise/fatigue.  HENT:  Positive for ear pain. Negative for congestion, ear discharge, hearing loss, sore throat and tinnitus.   Eyes:  Negative for blurred vision and double vision.  Respiratory:  Positive for cough and sputum production. Negative for shortness of breath and wheezing.   Cardiovascular:  Negative for chest pain.  Gastrointestinal:  Positive for nausea. Negative for vomiting.  Musculoskeletal:  Positive for back pain and joint pain.  Neurological:  Negative for headaches.      Objective:     BP (!) 142/86   Pulse 98   Temp (!) 97.3 F (36.3 C)   Ht 5' 2 (1.575 m)   Wt 183 lb (83 kg)   SpO2 96%   BMI 33.47 kg/m    Physical Exam Constitutional:      Appearance: Normal appearance.  HENT:     Head: Normocephalic.     Right Ear: Tympanic membrane normal. Swelling present. No drainage.     Left Ear: Tympanic membrane, ear canal and external ear normal.     Mouth/Throat:     Mouth: Mucous membranes are moist.     Pharynx: Oropharynx is clear.   Eyes:     Extraocular Movements: Extraocular movements intact.     Conjunctiva/sclera: Conjunctivae normal.     Cardiovascular:     Rate and Rhythm: Normal rate and regular rhythm.     Heart sounds: Normal heart sounds. No murmur heard. Pulmonary:     Effort: Pulmonary effort is normal.     Breath sounds: Normal breath sounds. No wheezing, rhonchi or rales.   Skin:    General: Skin is warm and dry.   Neurological:     General: No focal deficit present.     Mental Status: She is alert and oriented to person, place, and time.   Psychiatric:        Mood and Affect: Mood normal.        Behavior: Behavior normal.     No results found for any visits on 03/21/24.  The 10-year ASCVD risk score (Arnett DK, et al., 2019) is: 7.3%    Assessment & Plan:   Return if symptoms worsen or fail to improve.   Essential hypertension Assessment & Plan: 140/84 Uncontrolled. Continue current medications, increase valsartan  to 80mg  daily.  Discussed DASH diet and dietary sodium restrictions.  Increase dietary efforts and physical activity.   Orders: -     Valsartan ; Take 1 tablet (80 mg total) by mouth daily.  Dispense: 90 tablet; Refill: 3  Acute cough Assessment & Plan: Patient presents today with concerns for cough. Lungs clear to auscultation bilaterally, no increased work of breathing. Likely related to  tobacco use. We discussed improvement of symptoms with smoking cessation. Promethazine DM for cough at night, I advised Mucinex  for sputum production.   Orders: -     Promethazine-DM; Take 5 mLs by mouth 4 (four) times daily as needed for cough.  Dispense: 118 mL; Refill: 0  Acute otitis externa of right ear, unspecified type Assessment & Plan: Patient presents today with 3 days of right ear pain and fullness. Otoscope exam overall reassuring, patient with some inner canal swelling, TM intact. Will treat with ciprodex for concerns of otitis externa. Warning signs and return precautions discussed.   Orders: -     Ciprofloxacin -dexAMETHasone ; Place 4 drops into the right ear 2 (two) times  daily. For 1 week.  Dispense: 7.5 mL; Refill: 0    Shaeley Segall, PA-C

## 2024-03-21 NOTE — Assessment & Plan Note (Signed)
 Patient presents today with concerns for cough. Lungs clear to auscultation bilaterally, no increased work of breathing. Likely related to tobacco use. We discussed improvement of symptoms with smoking cessation. Promethazine DM for cough at night, I advised Mucinex  for sputum production.

## 2024-03-21 NOTE — Assessment & Plan Note (Signed)
 Patient presents today with 3 days of right ear pain and fullness. Otoscope exam overall reassuring, patient with some inner canal swelling, TM intact. Will treat with ciprodex for concerns of otitis externa. Warning signs and return precautions discussed.

## 2024-03-21 NOTE — Assessment & Plan Note (Signed)
 140/84 Uncontrolled. Continue current medications, increase valsartan  to 80mg  daily.  Discussed DASH diet and dietary sodium restrictions.  Increase dietary efforts and physical activity.

## 2024-03-30 ENCOUNTER — Encounter (HOSPITAL_COMMUNITY): Payer: Self-pay

## 2024-03-30 ENCOUNTER — Emergency Department (HOSPITAL_COMMUNITY)

## 2024-03-30 ENCOUNTER — Other Ambulatory Visit: Payer: Self-pay

## 2024-03-30 ENCOUNTER — Emergency Department (HOSPITAL_COMMUNITY)
Admission: EM | Admit: 2024-03-30 | Discharge: 2024-03-30 | Disposition: A | Discharge status: Home / Self Care | Attending: Emergency Medicine | Admitting: Emergency Medicine

## 2024-03-30 DIAGNOSIS — N1831 Chronic kidney disease, stage 3a: Secondary | ICD-10-CM | POA: Diagnosis not present

## 2024-03-30 DIAGNOSIS — N3090 Cystitis, unspecified without hematuria: Secondary | ICD-10-CM | POA: Insufficient documentation

## 2024-03-30 DIAGNOSIS — N281 Cyst of kidney, acquired: Secondary | ICD-10-CM | POA: Diagnosis not present

## 2024-03-30 DIAGNOSIS — K5790 Diverticulosis of intestine, part unspecified, without perforation or abscess without bleeding: Secondary | ICD-10-CM | POA: Diagnosis not present

## 2024-03-30 DIAGNOSIS — R109 Unspecified abdominal pain: Secondary | ICD-10-CM | POA: Diagnosis not present

## 2024-03-30 DIAGNOSIS — E876 Hypokalemia: Secondary | ICD-10-CM | POA: Insufficient documentation

## 2024-03-30 DIAGNOSIS — N289 Disorder of kidney and ureter, unspecified: Secondary | ICD-10-CM | POA: Diagnosis not present

## 2024-03-30 DIAGNOSIS — R9431 Abnormal electrocardiogram [ECG] [EKG]: Secondary | ICD-10-CM | POA: Diagnosis not present

## 2024-03-30 DIAGNOSIS — R10819 Abdominal tenderness, unspecified site: Secondary | ICD-10-CM | POA: Diagnosis present

## 2024-03-30 LAB — CBC WITH DIFFERENTIAL/PLATELET
Abs Immature Granulocytes: 0.15 10*3/uL — ABNORMAL HIGH (ref 0.00–0.07)
Basophils Absolute: 0.1 10*3/uL (ref 0.0–0.1)
Basophils Relative: 1 %
Eosinophils Absolute: 0.1 10*3/uL (ref 0.0–0.5)
Eosinophils Relative: 1 %
HCT: 48.9 % — ABNORMAL HIGH (ref 36.0–46.0)
Hemoglobin: 16.3 g/dL — ABNORMAL HIGH (ref 12.0–15.0)
Immature Granulocytes: 1 %
Lymphocytes Relative: 16 %
Lymphs Abs: 2.3 10*3/uL (ref 0.7–4.0)
MCH: 30.5 pg (ref 26.0–34.0)
MCHC: 33.3 g/dL (ref 30.0–36.0)
MCV: 91.4 fL (ref 80.0–100.0)
Monocytes Absolute: 0.7 10*3/uL (ref 0.1–1.0)
Monocytes Relative: 5 %
Neutro Abs: 11.1 10*3/uL — ABNORMAL HIGH (ref 1.7–7.7)
Neutrophils Relative %: 76 %
Platelets: 250 10*3/uL (ref 150–400)
RBC: 5.35 MIL/uL — ABNORMAL HIGH (ref 3.87–5.11)
RDW: 13.6 % (ref 11.5–15.5)
WBC: 14.4 10*3/uL — ABNORMAL HIGH (ref 4.0–10.5)
nRBC: 0 % (ref 0.0–0.2)

## 2024-03-30 LAB — COMPREHENSIVE METABOLIC PANEL WITH GFR
ALT: 23 U/L (ref 0–44)
AST: 19 U/L (ref 15–41)
Albumin: 3.9 g/dL (ref 3.5–5.0)
Alkaline Phosphatase: 90 U/L (ref 38–126)
Anion gap: 14 (ref 5–15)
BUN: 17 mg/dL (ref 8–23)
CO2: 21 mmol/L — ABNORMAL LOW (ref 22–32)
Calcium: 8.7 mg/dL — ABNORMAL LOW (ref 8.9–10.3)
Chloride: 100 mmol/L (ref 98–111)
Creatinine, Ser: 1.12 mg/dL — ABNORMAL HIGH (ref 0.44–1.00)
GFR, Estimated: 55 mL/min — ABNORMAL LOW (ref >60)
Glucose, Bld: 148 mg/dL — ABNORMAL HIGH (ref 70–99)
Potassium: 3.4 mmol/L — ABNORMAL LOW (ref 3.5–5.1)
Sodium: 135 mmol/L (ref 135–145)
Total Bilirubin: 0.6 mg/dL (ref 0.0–1.2)
Total Protein: 8.1 g/dL (ref 6.5–8.1)

## 2024-03-30 LAB — URINALYSIS, W/ REFLEX TO CULTURE (INFECTION SUSPECTED)
Bilirubin Urine: NEGATIVE
Glucose, UA: NEGATIVE mg/dL
Ketones, ur: NEGATIVE mg/dL
Nitrite: NEGATIVE
Protein, ur: NEGATIVE mg/dL
Specific Gravity, Urine: 1.009 (ref 1.005–1.030)
pH: 5 (ref 5.0–8.0)

## 2024-03-30 LAB — LIPASE, BLOOD: Lipase: 22 U/L (ref 11–51)

## 2024-03-30 LAB — TROPONIN I (HIGH SENSITIVITY)
Troponin I (High Sensitivity): 3 ng/L (ref <18)
Troponin I (High Sensitivity): 3 ng/L (ref <18)

## 2024-03-30 LAB — LACTIC ACID, PLASMA
Lactic Acid, Venous: 1.7 mmol/L (ref 0.5–1.9)
Lactic Acid, Venous: 0.7 mmol/L (ref 0.5–1.9)

## 2024-03-30 MED ORDER — MORPHINE SULFATE (PF) 4 MG/ML IV SOLN
4.0000 mg | Freq: Once | INTRAVENOUS | Status: AC
  Administered 2024-03-30: 4 mg via INTRAVENOUS
  Filled 2024-03-30: qty 1

## 2024-03-30 MED ORDER — SODIUM CHLORIDE 0.9 % IV SOLN
1.0000 g | Freq: Once | INTRAVENOUS | Status: AC
  Administered 2024-03-30: 1 g via INTRAVENOUS
  Filled 2024-03-30: qty 10

## 2024-03-30 MED ORDER — MAGNESIUM SULFATE 2 GM/50ML IV SOLN
2.0000 g | Freq: Once | INTRAVENOUS | Status: AC
  Administered 2024-03-30: 2 g via INTRAVENOUS
  Filled 2024-03-30: qty 50

## 2024-03-30 MED ORDER — IOHEXOL 300 MG/ML  SOLN
100.0000 mL | Freq: Once | INTRAMUSCULAR | Status: AC | PRN
  Administered 2024-03-30: 100 mL via INTRAVENOUS

## 2024-03-30 MED ORDER — SODIUM CHLORIDE 0.9 % IV BOLUS
1000.0000 mL | Freq: Once | INTRAVENOUS | Status: AC
  Administered 2024-03-30: 1000 mL via INTRAVENOUS

## 2024-03-30 MED ORDER — CEPHALEXIN 500 MG PO CAPS: 500.0000 mg | ORAL_CAPSULE | Freq: Two times a day (BID) | ORAL | 0 refills | Status: DC

## 2024-03-30 MED ORDER — KETOROLAC TROMETHAMINE 15 MG/ML IJ SOLN
15.0000 mg | Freq: Once | INTRAMUSCULAR | Status: AC
  Administered 2024-03-30: 15 mg via INTRAVENOUS
  Filled 2024-03-30: qty 1

## 2024-03-30 NOTE — ED Provider Notes (Signed)
 [place] EMERGENCY DEPARTMENT AT [facility] Provider Note   CSN: [id number] Arrival date & time: 03/30/24  1039     Patient presents with: Abdominal Pain   Daisy [name] is a 63 y.o. female with history of CKD, bipolar, urosepsis presents with complaints of abdominal pain that started Saturday and has since worsened.  Not associated with vomiting or diarrhea.  No urinary or vaginal symptoms.  Pain seems to be worse in the epigastric region.  Not associated with chest pain or shortness of breath.  Last bowel movement was this morning.  Does have a history of multiple C-sections and cholecystectomy.    Abdominal Pain  Past Medical History:  Diagnosis Date   Acute renal failure superimposed on stage 3a chronic kidney disease (HCC) 03/30/2021   Anxiety    Bipolar 1 disorder (HCC)    Chronic pain syndrome    Depression    Fatigue    Fibromyalgia    Kidney disease    Stage 3   Normal cardiac stress test 10/06/2000   Plantar fasciitis    PTSD (post-traumatic stress disorder)    Sepsis due to undetermined organism (HCC) 12/17/2021   SIRS (systemic inflammatory response syndrome) (HCC) 03/30/2021   Thyroid  disease        Prior to Admission medications   Medication Sig Start Date End Date Taking? Authorizing Provider  cephALEXin  (KEFLEX ) 500 MG capsule Take 1 capsule (500 mg total) by mouth 2 (two) times daily. 03/30/24  Yes Daisy Lynwood DEL, PA-C  ciprofloxacin -dexamethasone  (CIPRODEX) OTIC suspension Place 4 drops into the right ear 2 (two) times daily. For 1 week. 03/21/24   Grooms, Courtney, PA-C  escitalopram  (LEXAPRO ) 20 MG tablet Take 1 tablet by mouth once daily 08/19/23   Alphonsa Glendia LABOR, MD  mupirocin  ointment (BACTROBAN ) 2 % Apply thin amount bid to scratches 01/21/24   Luking, Glendia LABOR, MD  ondansetron  (ZOFRAN -ODT) 4 MG disintegrating tablet Take 1 tablet (4 mg total) by mouth 3 (three) times daily as needed for nausea or vomiting. 12/22/23   Rourk, [name],  MD  pantoprazole  (PROTONIX ) 40 MG tablet Take 1 tablet (40 mg total) by mouth 2 (two) times daily before a meal. 11/12/23   Rourk, [name], MD  promethazine-dextromethorphan (PROMETHAZINE-DM) 6.25-15 MG/5ML syrup Take 5 mLs by mouth 4 (four) times daily as needed for cough. 03/21/24   Grooms, Courtney, PA-C  valsartan  (DIOVAN ) 80 MG tablet Take 1 tablet (80 mg total) by mouth daily. 03/21/24   Grooms, [place], PA-C    Allergies: Patient has no known allergies.    Review of Systems  Gastrointestinal:  Positive for abdominal pain.    Updated Vital Signs BP (!) 155/87   Pulse 76   Temp 98.3 F (36.8 C) (Oral)   Resp 16   Ht 5' 2 (1.575 m)   Wt 83 kg   SpO2 94%   BMI 33.47 kg/m   Physical Exam Vitals and nursing note reviewed.  Constitutional:      General: She is not in acute distress.    Appearance: She is well-developed.  HENT:     Head: Normocephalic and atraumatic.   Eyes:     Conjunctiva/sclera: Conjunctivae normal.    Cardiovascular:     Rate and Rhythm: Normal rate and regular rhythm.     Heart sounds: No murmur heard. Pulmonary:     Effort: Pulmonary effort is normal. No respiratory distress.     Breath sounds: Normal breath sounds.  Abdominal:  Palpations: Abdomen is soft.     Tenderness: There is abdominal tenderness.     Comments: Generalized abdominal tenderness worse epigastric and left lower quadrant, positive left CVAT   Musculoskeletal:        General: No swelling.     Cervical back: Neck supple.   Skin:    General: Skin is warm and dry.     Capillary Refill: Capillary refill takes less than 2 seconds.   Neurological:     Mental Status: She is alert.   Psychiatric:        Mood and Affect: Mood normal.     (all labs ordered are listed, but only abnormal results are displayed) Labs Reviewed  COMPREHENSIVE METABOLIC PANEL WITH GFR - Abnormal; Notable for the following components:      Result Value   Potassium 3.4 (*)    CO2 21 (*)     Glucose, Bld 148 (*)    Creatinine, Ser 1.12 (*)    Calcium  8.7 (*)    GFR, Estimated 55 (*)    All other components within normal limits  CBC WITH DIFFERENTIAL/PLATELET - Abnormal; Notable for the following components:   WBC 14.4 (*)    RBC 5.35 (*)    Hemoglobin 16.3 (*)    HCT 48.9 (*)    Neutro Abs 11.1 (*)    Abs Immature Granulocytes 0.15 (*)    All other components within normal limits  URINALYSIS, W/ REFLEX TO CULTURE (INFECTION SUSPECTED) - Abnormal; Notable for the following components:   Hgb urine dipstick MODERATE (*)    Leukocytes,Ua TRACE (*)    Bacteria, UA MANY (*)    All other components within normal limits  CULTURE, BLOOD (ROUTINE X 2)  CULTURE, BLOOD (ROUTINE X 2)  URINE CULTURE  LACTIC ACID, PLASMA  LACTIC ACID, PLASMA  LIPASE, BLOOD  TROPONIN I (HIGH SENSITIVITY)  TROPONIN I (HIGH SENSITIVITY)    EKG: None  Radiology: CT ABDOMEN PELVIS W CONTRAST Result Date: 03/30/2024 CLINICAL DATA:  Acute generalized abdominal pain. EXAM: CT ABDOMEN AND PELVIS WITH CONTRAST TECHNIQUE: Multidetector CT imaging of the abdomen and pelvis was performed using the standard protocol following bolus administration of intravenous contrast. RADIATION DOSE REDUCTION: This exam was performed according to the departmental dose-optimization program which includes automated exposure control, adjustment of the mA and/or kV according to patient size and/or use of iterative reconstruction technique. CONTRAST:  OMNIPAQUE  IOHEXOL  300 MG/ML  SOLN COMPARISON:  January 02, 2022. FINDINGS: Lower chest: No acute abnormality. Hepatobiliary: No focal liver abnormality is seen. Status post cholecystectomy. No biliary dilatation. Pancreas: Unremarkable. No pancreatic ductal dilatation or surrounding inflammatory changes. Spleen: Normal in size without focal abnormality. Adrenals/Urinary Tract: Adrenal glands appear normal. Stable small right renal cysts are noted. No hydronephrosis or renal  obstruction is noted. Urinary bladder is unremarkable. Stomach/Bowel: The stomach is unremarkable. The appendix is not visualized. There is no evidence of bowel obstruction or inflammation. Diverticulosis of descending and sigmoid colon is noted without inflammation. Vascular/Lymphatic: Aortic atherosclerosis. No enlarged abdominal or pelvic lymph nodes. Reproductive: Uterus and bilateral adnexa are unremarkable. Other: No ascites or hernia is noted. Status post ventral hernia repair. Musculoskeletal: No acute or significant osseous findings. IMPRESSION: No acute abnormality seen in the abdomen or pelvis. Aortic Atherosclerosis (ICD10-I70.0). Electronically Signed   By: Lynwood Landy Raddle M.D.   On: 03/30/2024 13:55   DG Chest 2 View Result Date: 03/30/2024 CLINICAL DATA:  Abdominal pain.  History of kidney disease EXAM: CHEST -  2 VIEW COMPARISON:  Chest x-ray 09/13/2022. FINDINGS: No consolidation, pneumothorax or effusion. No edema. Normal cardiopericardial silhouette. Overlapping cardiac leads. Degenerative changes along the spine. On the lateral view the upper thorax is clipped off the edge of the film. Surgical clips in the upper abdomen. Fixation hardware seen of the cervical spine at the edge of the imaging field. IMPRESSION: No acute cardiopulmonary disease. Electronically Signed   By: Ranell Bring M.D.   On: 03/30/2024 12:15     Procedures   Medications Ordered in the ED  cefTRIAXone  (ROCEPHIN ) 1 g in sodium chloride  0.9 % 100 mL IVPB (0 g Intravenous Stopped 03/30/24 1225)  sodium chloride  0.9 % bolus 1,000 mL (0 mLs Intravenous Stopped 03/30/24 1246)  morphine  (PF) 4 MG/ML injection 4 mg (4 mg Intravenous Given 03/30/24 1206)  magnesium sulfate IVPB 2 g 50 mL (2 g Intravenous New Bag/Given 03/30/24 1245)  ketorolac  (TORADOL ) 15 MG/ML injection 15 mg (15 mg Intravenous Given 03/30/24 1240)  iohexol  (OMNIPAQUE ) 300 MG/ML solution 100 mL (100 mLs Intravenous Contrast Given 03/30/24 1309)                                     Medical Decision Making Amount and/or Complexity of Data Reviewed Labs: ordered. Radiology: ordered.  Risk Prescription drug management.   This patient presents to the ED with chief complaint(s) of abdominal pain.  The complaint involves an extensive differential diagnosis and also carries with it a high risk of complications and morbidity.   Pertinent past medical history as listed in HPI  The differential diagnosis includes  Cystitis, pyelonephritis, nephrolithiasis, ACS, aortic dissection Additional history obtained: Records reviewed Care Everywhere/External Records  Assessment and management:   Patient presents tachycardic to 116 with complaints of abdominal pain that started this past Saturday and has since worsened.  Not associated with vomiting, diarrhea, urinary or vaginal symptoms.  Last bowel movement was earlier today.  She does have a history of cholecystectomy and multiple C-sections.  She additionally has a history of urosepsis.  On exam she has generalized tenderness worse to the epigastric and left lower quadrant regions with positive left CVAT.  She has no associated chest pain or shortness of breath.  Overall workup is notable for UTI.  Prior cultures are pansensitive.  Will send in prescription for Keflex .  She will follow-up with her PCP.  Independent ECG interpretation:  Sinus rhythm  Independent labs interpretation:  The following labs were independently interpreted:  CBC with leukocytosis of 14.4 lipase within normal limits, UA with moderate hemoglobin, trace leukocytes, negative nitrites, many bacteria, lactic acid without elevation  Independent visualization and interpretation of imaging: I independently visualized the following imaging with scope of interpretation limited to determining acute life threatening conditions related to emergency care:  CT abdomen pelvis no acute abnormality Chest x-ray with no cardiopulmonary  disease  Consultations obtained:   none  Disposition:   Patient will be discharged home. The patient has been appropriately medically screened and/or stabilized in the ED. I have low suspicion for any other emergent medical condition which would require further screening, evaluation or treatment in the ED or require inpatient management. At time of discharge the patient is hemodynamically stable and in no acute distress. I have discussed work-up results and diagnosis with patient and answered all questions. Patient is agreeable with discharge plan. We discussed strict return precautions for returning to the emergency department and  they verbalized understanding.     Social Determinants of Health:   none  This note was dictated with voice recognition software.  Despite best efforts at proofreading, errors may have occurred which can change the documentation meaning.       Final diagnoses:  Cystitis    ED Discharge Orders          Ordered    cephALEXin  (KEFLEX ) 500 MG capsule  2 times daily        03/30/24 1431               Daisy [name] 03/30/24 1432    Yolande Lamar BROCKS, MD 03/31/24 [phone]

## 2024-03-30 NOTE — ED Triage Notes (Signed)
 Pt arrived via POV from home c/o severe abdominal pain that began on Saturday. Pt reports Hx of urosepsis.

## 2024-03-30 NOTE — Discharge Instructions (Signed)
 You were evaluated in the emergency room for abdominal pain.  You were found to have a UTI.  Prescription for antibiotics was sent into your pharmacy.  I recommend following up with your primary care doctor to ensure your symptoms are improving.  If you experience any new or worsening symptoms please return to the emergency room.

## 2024-03-31 LAB — URINE CULTURE: Special Requests: NORMAL

## 2024-04-04 LAB — CULTURE, BLOOD (ROUTINE X 2)
Culture: NO GROWTH
Culture: NO GROWTH
Special Requests: ADEQUATE
Special Requests: ADEQUATE

## 2024-04-13 ENCOUNTER — Other Ambulatory Visit: Payer: Self-pay | Admitting: Family Medicine

## 2024-04-13 DIAGNOSIS — F321 Major depressive disorder, single episode, moderate: Secondary | ICD-10-CM

## 2024-04-29 ENCOUNTER — Ambulatory Visit: Payer: Self-pay | Admitting: *Deleted

## 2024-04-29 ENCOUNTER — Other Ambulatory Visit (HOSPITAL_COMMUNITY): Payer: Self-pay | Admitting: Nephrology

## 2024-04-29 DIAGNOSIS — N1831 Chronic kidney disease, stage 3a: Secondary | ICD-10-CM

## 2024-04-29 NOTE — Telephone Encounter (Signed)
 FYI Only or Action Required?: FYI only for provider.  Patient was last seen in primary care on 01/21/2024 by Alphonsa Glendia LABOR, MD.  Called Nurse Triage reporting Abdominal Pain (Abdominal pain, vaginal pressure,nausea).  Symptoms began several weeks ago.  Interventions attempted: Prescription medications: cephalexin .  Symptoms are: unchanged.  Triage Disposition: See HCP Within 4 Hours (Or PCP Triage)  Patient/caregiver understands and will follow disposition?: Patient request OV- no open appointment- UC advised    Reason for Disposition  [1] MILD-MODERATE pain AND [2] constant AND [3] present > 2 hours  Answer Assessment - Initial Assessment Questions 1. LOCATION: Where does it hurt?      Low quadrant- bilateral 2. RADIATION: Does the pain shoot anywhere else? (e.g., chest, back)     Into lower back and side 3. ONSET: When did the pain begin? (e.g., minutes, hours or days ago)      ED -3 weeks ago- treated for UTI  5. PATTERN Does the pain come and go, or is it constant?     constant 6. SEVERITY: How bad is the pain?  (e.g., Scale 1-10; mild, moderate, or severe)     Not as bad as went to ED 7. RECURRENT SYMPTOM: Have you ever had this type of stomach pain before? If Yes, ask: When was the last time? and What happened that time?      Yes- UTI- 3 years 8. CAUSE: What do you think is causing the stomach pain? (e.g., gallstones, recent abdominal surgery)     unsure 9. RELIEVING/AGGRAVATING FACTORS: What makes it better or worse? (e.g., antacids, bending or twisting motion, bowel movement)     no 10. OTHER SYMPTOMS: Do you have any other symptoms? (e.g., back pain, diarrhea, fever, urination pain, vomiting)       Vomiting, chills, nausea  Protocols used: Abdominal Pain - [facility]   Copied from CRM #[id number]. Topic: Clinical - Red Word Triage >> Apr 29, 2024 12:03 PM Fonda T wrote: Red Word that prompted transfer to Nurse Triage: Patient calling,  reports she is having abdominal pain, and increased vaginal pressure, and nausea.

## 2024-05-04 ENCOUNTER — Ambulatory Visit (HOSPITAL_COMMUNITY): Admission: RE | Admit: 2024-05-04 | Source: Ambulatory Visit

## 2024-06-13 ENCOUNTER — Telehealth: Payer: Self-pay

## 2024-06-13 DIAGNOSIS — I10 Essential (primary) hypertension: Secondary | ICD-10-CM

## 2024-06-13 NOTE — Progress Notes (Signed)
 Complex Care Management Note  Care Guide Note 06/13/2024 Name: Daisy [name] MRN: [id number] DOB: 1961-08-05  [name] is a 63 y.o. year old female who sees Luking, Glendia LABOR, MD for primary care. I reached out to [name] by phone today to offer complex care management services.  [name] was given information about Complex Care Management services today including:   The Complex Care Management services include support from the care team which includes your Nurse Care Manager, Clinical Social Worker, or Pharmacist.  The Complex Care Management team is here to help remove barriers to the health concerns and goals most important to you. Complex Care Management services are voluntary, and the patient may decline or stop services at any time by request to their care team member.   Complex Care Management Consent Status: Patient agreed to services and verbal consent obtained.   Follow up plan:  Telephone appointment with complex care management team member scheduled for:  06/16/24 at 11:00 a.m.   Encounter Outcome:  Patient Scheduled  Dreama [facility]  [facility], [facility] VBCI Assistant Direct Dial: [phone]  Fax: [phone]

## 2024-06-16 ENCOUNTER — Encounter: Payer: Self-pay | Admitting: *Deleted

## 2024-06-16 ENCOUNTER — Telehealth: Payer: Self-pay | Admitting: *Deleted

## 2024-06-16 NOTE — Patient Instructions (Signed)
 Daisy [name] - I am sorry I was unable to reach you today for our scheduled appointment. I work with Alphonsa Glendia LABOR, MD and am calling to support your healthcare needs. Please contact me at [phone] at your earliest convenience. I look forward to speaking with you soon.   Thank you,  Rosina Forte, BSN RN [facility], [facility] RN Care Manager Direct Dial: [phone]  Fax: [phone]

## 2024-06-22 ENCOUNTER — Telehealth: Payer: Self-pay

## 2024-06-22 NOTE — Progress Notes (Signed)
 Complex Care Management Care Guide Note  06/22/2024 Name: Daisy [name] MRN: [id number] DOB: June 13, 1961  Daisy [name] is a 63 y.o. year old female who is a primary care patient of Luking, Glendia LABOR, MD and is actively engaged with the care management team. I reached out to Daisy [name] by phone today to assist with re-scheduling  with the RN Case Manager.  Follow up plan: Unsuccessful telephone outreach attempt made. A HIPAA compliant phone message was left for the patient providing contact information and requesting a return call.  Jeoffrey Buffalo , RMA     [facility]  [facility], [facility] Guide  Direct Dial: [phone]  Website: [email or web address]

## 2024-07-05 NOTE — Progress Notes (Unsigned)
 Complex Care Management Care Guide Note  07/05/2024 Name: Daisy [name] MRN: [id number] DOB: 1961-03-30  Daisy [name] is a 63 y.o. year old female who is a primary care patient of Daisy [name], Daisy LABOR, MD and is actively engaged with the care management team. I reached out to Daisy [name] by phone today to assist with re-scheduling  with the RN Case Manager.  Follow up plan: Unsuccessful telephone outreach attempt made. A HIPAA compliant phone message was left for the patient providing contact information and requesting a return call.  Jeoffrey Buffalo , RMA     [facility]  [facility], [facility] Guide  Direct Dial: [phone]  Website: [email or web address]

## 2024-07-08 NOTE — Progress Notes (Signed)
 Complex Care Management Care Guide Note  07/08/2024 Name: Daisy [name] MRN: [id number] DOB: 09-Jun-1961  [name] is a 63 y.o. year old female who is a primary care patient of Luking, Glendia LABOR, MD and is actively engaged with the care management team. I reached out to [name] by phone today to assist with re-scheduling  with the RN Case Manager.  Follow up plan: Unsuccessful telephone outreach attempt made. A HIPAA compliant phone message was left for the patient providing contact information and requesting a return call.  Jeoffrey Buffalo , RMA     [facility]  [facility], [facility] Guide  Direct Dial: [phone]  Website: [email or web address]

## 2024-07-21 ENCOUNTER — Ambulatory Visit (HOSPITAL_COMMUNITY)
Admission: RE | Admit: 2024-07-21 | Discharge: 2024-07-21 | Disposition: A | Discharge status: Home / Self Care | Source: Ambulatory Visit | Attending: Nephrology | Admitting: Nephrology

## 2024-07-21 DIAGNOSIS — N1831 Chronic kidney disease, stage 3a: Secondary | ICD-10-CM | POA: Insufficient documentation

## 2024-07-21 DIAGNOSIS — N183 Chronic kidney disease, stage 3 unspecified: Secondary | ICD-10-CM | POA: Diagnosis not present

## 2024-07-22 ENCOUNTER — Encounter: Payer: Self-pay | Admitting: Family Medicine

## 2024-07-22 ENCOUNTER — Ambulatory Visit: Admitting: Family Medicine

## 2024-07-22 VITALS — BP 136/86 | HR 102 | Temp 97.3°F | Ht 62.0 in | Wt 181.2 lb

## 2024-07-22 DIAGNOSIS — R7303 Prediabetes: Secondary | ICD-10-CM | POA: Diagnosis not present

## 2024-07-22 DIAGNOSIS — Z23 Encounter for immunization: Secondary | ICD-10-CM

## 2024-07-22 DIAGNOSIS — D692 Other nonthrombocytopenic purpura: Secondary | ICD-10-CM

## 2024-07-22 DIAGNOSIS — F321 Major depressive disorder, single episode, moderate: Secondary | ICD-10-CM

## 2024-07-22 DIAGNOSIS — D631 Anemia in chronic kidney disease: Secondary | ICD-10-CM | POA: Diagnosis not present

## 2024-07-22 DIAGNOSIS — F411 Generalized anxiety disorder: Secondary | ICD-10-CM | POA: Diagnosis not present

## 2024-07-22 DIAGNOSIS — E785 Hyperlipidemia, unspecified: Secondary | ICD-10-CM | POA: Diagnosis not present

## 2024-07-22 DIAGNOSIS — I10 Essential (primary) hypertension: Secondary | ICD-10-CM | POA: Diagnosis not present

## 2024-07-22 DIAGNOSIS — R809 Proteinuria, unspecified: Secondary | ICD-10-CM | POA: Diagnosis not present

## 2024-07-22 DIAGNOSIS — R051 Acute cough: Secondary | ICD-10-CM

## 2024-07-22 DIAGNOSIS — N1831 Chronic kidney disease, stage 3a: Secondary | ICD-10-CM | POA: Diagnosis not present

## 2024-07-22 DIAGNOSIS — D509 Iron deficiency anemia, unspecified: Secondary | ICD-10-CM | POA: Diagnosis not present

## 2024-07-22 DIAGNOSIS — I129 Hypertensive chronic kidney disease with stage 1 through stage 4 chronic kidney disease, or unspecified chronic kidney disease: Secondary | ICD-10-CM | POA: Diagnosis not present

## 2024-07-22 DIAGNOSIS — N189 Chronic kidney disease, unspecified: Secondary | ICD-10-CM | POA: Diagnosis not present

## 2024-07-22 MED ORDER — BUPROPION HCL ER (XL) 150 MG PO TB24: 150.0000 mg | ORAL_TABLET | Freq: Every day | ORAL | 1 refills | Status: AC

## 2024-07-22 MED ORDER — CLONAZEPAM 0.5 MG PO TABS: 0.5000 mg | ORAL_TABLET | Freq: Two times a day (BID) | ORAL | 1 refills | Status: AC | PRN

## 2024-07-22 MED ORDER — ESCITALOPRAM OXALATE 20 MG PO TABS: 20.0000 mg | ORAL_TABLET | Freq: Every day | ORAL | 3 refills | Status: AC

## 2024-07-22 NOTE — Progress Notes (Unsigned)
    Subjective:    Patient ID: Daisy [name], female    DOB: [birth year], 63 y.o.   MRN: [id number]  HPI Pt. In room 7. Discussed the use of AI scribe software for clinical note transcription with the patient, who gave verbal consent to proceed.  History of Present Illness    Patient very stressed Her spouse recently diagnosed with some possible tumors Takes her blood pressure medicine regular basis tries to watch her diet Anxiety depression has been very difficult not suicidal We did discuss clonazepam  that she can use when she is having panicky feelings while she is working through this issue with her husband Should continue the Lexapro  but add Wellbutrin to this Patient to let us  know how things are going in 4 weeks Patient to follow-up within 3 to 4 months She does take her acid blocker regular basis   Review of Systems     Objective:   Physical Exam  General-in no acute distress Eyes-no discharge Lungs-respiratory rate normal, CTA CV-no murmurs,RRR Extremities skin warm dry no edema Neuro grossly normal Behavior normal, alert  Mild senile purpura     Assessment & Plan:   1. Immunization due Today - Flu vaccine trivalent PF, 6mos and older(Flulaval,Afluria,Fluarix,Fluzone)  2. Essential hypertension (Primary) Healthy diet Continue medication   3. Depression, major, single episode, moderate (HCC) Add Wellbutrin  - escitalopram  (LEXAPRO ) 20 MG tablet; Take 1 tablet (20 mg total) by mouth daily.  Dispense: 90 tablet; Refill: 3  4. GAD (generalized anxiety disorder) Clonazepam  when panicky  Discussion held regarding the stresses related to her husband being diagnosed with possible tumors Senile purpura stable Prediabetes-Will check lab work Hyperlipidemia-will check lab work   A1c was checked previously was 6.01-year ago

## 2024-07-26 MED ORDER — PROMETHAZINE-DM 6.25-15 MG/5ML PO SYRP: 5.0000 mL | ORAL_SOLUTION | Freq: Four times a day (QID) | ORAL | 0 refills | Status: AC | PRN

## 2024-07-28 NOTE — Addendum Note (Signed)
 Addended by: DELORES LIONEL RAMAN on: 07/28/2024 02:50 PM   Modules accepted: Orders

## 2024-08-04 DIAGNOSIS — N1831 Chronic kidney disease, stage 3a: Secondary | ICD-10-CM | POA: Diagnosis not present

## 2024-08-04 DIAGNOSIS — N2581 Secondary hyperparathyroidism of renal origin: Secondary | ICD-10-CM | POA: Diagnosis not present

## 2024-08-04 DIAGNOSIS — I129 Hypertensive chronic kidney disease with stage 1 through stage 4 chronic kidney disease, or unspecified chronic kidney disease: Secondary | ICD-10-CM | POA: Diagnosis not present

## 2024-08-04 DIAGNOSIS — E559 Vitamin D deficiency, unspecified: Secondary | ICD-10-CM | POA: Diagnosis not present

## 2024-08-31 ENCOUNTER — Encounter: Payer: Self-pay | Admitting: Family Medicine

## 2024-08-31 NOTE — Telephone Encounter (Signed)
 Messaged patient.

## 2024-10-14 ENCOUNTER — Ambulatory Visit: Admitting: Urology

## 2024-12-30 ENCOUNTER — Ambulatory Visit: Admitting: Urology

## 2025-01-20 ENCOUNTER — Ambulatory Visit: Admitting: Family Medicine
